# Patient Record
Sex: Female | Born: 1970 | Race: White | Hispanic: No | Marital: Married | Smoking: Never smoker
Health system: Southern US, Community
[De-identification: ages and names within clinical notes are randomized; demographics above are authoritative.]

## PROBLEM LIST (undated history)

## (undated) DIAGNOSIS — J069 Acute upper respiratory infection, unspecified: Secondary | ICD-10-CM

## (undated) DIAGNOSIS — R112 Nausea with vomiting, unspecified: Secondary | ICD-10-CM

## (undated) DIAGNOSIS — J40 Bronchitis, not specified as acute or chronic: Secondary | ICD-10-CM

## (undated) DIAGNOSIS — L509 Urticaria, unspecified: Secondary | ICD-10-CM

## (undated) DIAGNOSIS — Z9889 Other specified postprocedural states: Secondary | ICD-10-CM

## (undated) DIAGNOSIS — R091 Pleurisy: Secondary | ICD-10-CM

## (undated) DIAGNOSIS — J45909 Unspecified asthma, uncomplicated: Secondary | ICD-10-CM

## (undated) HISTORY — PX: CERVICAL BIOPSY  W/ LOOP ELECTRODE EXCISION: SUR135

## (undated) HISTORY — DX: Acute upper respiratory infection, unspecified: J06.9

## (undated) HISTORY — DX: Unspecified asthma, uncomplicated: J45.909

## (undated) HISTORY — DX: Urticaria, unspecified: L50.9

## (undated) HISTORY — PX: APPENDECTOMY: SHX54

---

## 2001-07-14 ENCOUNTER — Other Ambulatory Visit: Admission: RE | Admit: 2001-07-14 | Discharge: 2001-07-14 | Payer: Self-pay | Admitting: Family Medicine

## 2001-07-14 ENCOUNTER — Other Ambulatory Visit: Admission: RE | Admit: 2001-07-14 | Discharge: 2001-07-14 | Payer: Self-pay | Admitting: Obstetrics & Gynecology

## 2002-07-19 ENCOUNTER — Other Ambulatory Visit: Admission: RE | Admit: 2002-07-19 | Discharge: 2002-07-19 | Payer: Self-pay | Admitting: Obstetrics and Gynecology

## 2003-08-29 ENCOUNTER — Other Ambulatory Visit: Admission: RE | Admit: 2003-08-29 | Discharge: 2003-08-29 | Payer: Self-pay | Admitting: Obstetrics & Gynecology

## 2004-08-13 ENCOUNTER — Other Ambulatory Visit: Admission: RE | Admit: 2004-08-13 | Discharge: 2004-08-13 | Payer: Self-pay | Admitting: Obstetrics & Gynecology

## 2006-09-21 ENCOUNTER — Emergency Department (HOSPITAL_COMMUNITY): Admission: EM | Admit: 2006-09-21 | Discharge: 2006-09-21 | Payer: Self-pay | Admitting: Emergency Medicine

## 2011-01-18 ENCOUNTER — Other Ambulatory Visit: Payer: Self-pay | Admitting: Obstetrics & Gynecology

## 2011-01-18 DIAGNOSIS — N6320 Unspecified lump in the left breast, unspecified quadrant: Secondary | ICD-10-CM

## 2011-01-24 ENCOUNTER — Other Ambulatory Visit: Payer: Self-pay | Admitting: Obstetrics & Gynecology

## 2011-01-24 ENCOUNTER — Ambulatory Visit
Admission: RE | Admit: 2011-01-24 | Discharge: 2011-01-24 | Disposition: A | Discharge status: Home / Self Care | Payer: BC Managed Care – PPO | Source: Ambulatory Visit | Attending: Obstetrics & Gynecology | Admitting: Obstetrics & Gynecology

## 2011-01-24 DIAGNOSIS — N6320 Unspecified lump in the left breast, unspecified quadrant: Secondary | ICD-10-CM

## 2014-12-27 DIAGNOSIS — Z91038 Other insect allergy status: Secondary | ICD-10-CM | POA: Insufficient documentation

## 2015-05-22 ENCOUNTER — Encounter (HOSPITAL_BASED_OUTPATIENT_CLINIC_OR_DEPARTMENT_OTHER): Payer: Self-pay | Admitting: *Deleted

## 2015-05-22 ENCOUNTER — Emergency Department (HOSPITAL_BASED_OUTPATIENT_CLINIC_OR_DEPARTMENT_OTHER): Payer: BLUE CROSS/BLUE SHIELD

## 2015-05-22 ENCOUNTER — Emergency Department (HOSPITAL_BASED_OUTPATIENT_CLINIC_OR_DEPARTMENT_OTHER)
Admission: EM | Admit: 2015-05-22 | Discharge: 2015-05-22 | Disposition: A | Discharge status: Home / Self Care | Payer: BLUE CROSS/BLUE SHIELD | Attending: Emergency Medicine | Admitting: Emergency Medicine

## 2015-05-22 DIAGNOSIS — R42 Dizziness and giddiness: Secondary | ICD-10-CM | POA: Diagnosis not present

## 2015-05-22 DIAGNOSIS — R110 Nausea: Secondary | ICD-10-CM | POA: Insufficient documentation

## 2015-05-22 DIAGNOSIS — Z8709 Personal history of other diseases of the respiratory system: Secondary | ICD-10-CM | POA: Diagnosis not present

## 2015-05-22 DIAGNOSIS — R079 Chest pain, unspecified: Secondary | ICD-10-CM | POA: Insufficient documentation

## 2015-05-22 HISTORY — DX: Bronchitis, not specified as acute or chronic: J40

## 2015-05-22 HISTORY — DX: Pleurisy: R09.1

## 2015-05-22 LAB — CBC WITH DIFFERENTIAL/PLATELET
BASOS ABS: 0 10*3/uL (ref 0.0–0.1)
Basophils Relative: 0 %
EOS PCT: 1 %
Eosinophils Absolute: 0.1 10*3/uL (ref 0.0–0.7)
HCT: 40.9 % (ref 36.0–46.0)
Hemoglobin: 13.1 g/dL (ref 12.0–15.0)
LYMPHS ABS: 2 10*3/uL (ref 0.7–4.0)
LYMPHS PCT: 29 %
MCH: 30 pg (ref 26.0–34.0)
MCHC: 32 g/dL (ref 30.0–36.0)
MCV: 93.8 fL (ref 78.0–100.0)
Monocytes Absolute: 0.7 10*3/uL (ref 0.1–1.0)
Monocytes Relative: 9 %
NEUTROS ABS: 4.2 10*3/uL (ref 1.7–7.7)
NEUTROS PCT: 61 %
PLATELETS: 309 10*3/uL (ref 150–400)
RBC: 4.36 MIL/uL (ref 3.87–5.11)
RDW: 12.6 % (ref 11.5–15.5)
WBC: 6.9 10*3/uL (ref 4.0–10.5)

## 2015-05-22 LAB — COMPREHENSIVE METABOLIC PANEL
ALBUMIN: 4.4 g/dL (ref 3.5–5.0)
ALT: 18 U/L (ref 14–54)
AST: 19 U/L (ref 15–41)
Alkaline Phosphatase: 68 U/L (ref 38–126)
Anion gap: 8 (ref 5–15)
BUN: 12 mg/dL (ref 6–20)
CHLORIDE: 104 mmol/L (ref 101–111)
CO2: 27 mmol/L (ref 22–32)
Calcium: 9.4 mg/dL (ref 8.9–10.3)
Creatinine, Ser: 0.69 mg/dL (ref 0.44–1.00)
GFR calc Af Amer: >60 mL/min (ref >60)
GFR calc non Af Amer: >60 mL/min (ref >60)
GLUCOSE: 96 mg/dL (ref 65–99)
POTASSIUM: 3.6 mmol/L (ref 3.5–5.1)
SODIUM: 139 mmol/L (ref 135–145)
Total Bilirubin: 0.6 mg/dL (ref 0.3–1.2)
Total Protein: 7.6 g/dL (ref 6.5–8.1)

## 2015-05-22 LAB — TROPONIN I: Troponin I: <0.03 ng/mL (ref <0.031)

## 2015-05-22 NOTE — ED Provider Notes (Signed)
CSN: [id number]     Arrival date & time 05/22/15  1745 History  By signing my name below, I, Eustaquio Maize, attest that this documentation has been prepared under the direction and in the presence of Quintella Reichert, MD. Electronically Signed: Eustaquio Maize, ED Scribe. 05/22/2015. 7:39 PM.  Chief Complaint  Patient presents with  . Chest Pain   The history is provided by the patient. No language interpreter was used.     HPI Comments: Wanda Cortez is a 44 y.o. female who presents to the Emergency Department complaining of gradual onset, constant, left sided chest pain that began at 5:10 PM (approximately 2.5 hours ago). Pt states she was taking a walk when the pain began. The pain was a sharp pressure that lasted approximately 10-15 minutes until it subsided into an aching sensation. Pt states that she is still currently having mild chest pain. She also complains of pain radiating into her left arm, back pain, lightheadedness, dizziness, and nausea. Pt reports chest pain in the past when she was diagnosed with pleurisy but states that these symptoms feel different. She denies any recent change in activity but states she has been feeling more fatigued than usual lately. Pt has been speaking with her PCP about her fatigue. Denies abdominal pain, shortness of breath, diaphoresis, leg swelling, or any other associated symptoms. Pt is non smoker. Denies EtOH or illicit drug use. No fhx of cardiac issues. PSHx appendectomy.   Past Medical History  Diagnosis Date  . Bronchitis   . Pleurisy    Past Surgical History  Procedure Laterality Date  . Appendectomy     No family history on file. Social History  Substance Use Topics  . Smoking status: Never Smoker   . Smokeless tobacco: Never Used  . Alcohol Use: No   OB History    No data available     Review of Systems  Constitutional: Negative for diaphoresis.  Respiratory: Negative for shortness of breath.   Cardiovascular: Positive for  chest pain. Negative for palpitations and leg swelling.  Gastrointestinal: Positive for nausea. Negative for vomiting and abdominal pain.  Neurological: Positive for dizziness and light-headedness.  All other systems reviewed and are negative.  Allergies  Prednisone  Home Medications   Prior to Admission medications   Not on File   Triage Vitals: BP 139/102 mmHg  Pulse 82  Temp(Src) 98.2 F (36.8 C) (Oral)  Resp 16  Ht 5\' 7"  (1.702 m)  Wt 170 lb (77.111 kg)  BMI 26.62 kg/m2  SpO2 100%  LMP 05/07/2015 (Approximate)   Physical Exam  Constitutional: She is oriented to person, place, and time. She appears well-developed and well-nourished.  HENT:  Head: Normocephalic and atraumatic.  Cardiovascular: Normal rate and regular rhythm.   No murmur heard. Pulmonary/Chest: Effort normal and breath sounds normal. No respiratory distress.  Abdominal: Soft. There is no tenderness. There is no rebound and no guarding.  Musculoskeletal: She exhibits no edema or tenderness.  Neurological: She is alert and oriented to person, place, and time.  Skin: Skin is warm and dry.  Psychiatric: She has a normal mood and affect. Her behavior is normal.  Nursing note and vitals reviewed.   ED Course  Procedures (including critical care time)  DIAGNOSTIC STUDIES: Oxygen Saturation is 100% on RA, normal by my interpretation.    COORDINATION OF CARE: 7:38 PM-Discussed treatment plan which includes repeat troponin with pt at bedside and pt agreed to plan.   Labs Review Labs Reviewed  CBC WITH DIFFERENTIAL/PLATELET  COMPREHENSIVE METABOLIC PANEL  TROPONIN I  TROPONIN I    Imaging Review Dg Chest 2 View  05/22/2015  CLINICAL DATA:  Initial encounter for left-sided chest pain starting this evening with dizziness after walking. Pain radiates to left arm and back. EXAM: CHEST  2 VIEW COMPARISON:  None. FINDINGS: The heart size and mediastinal contours are within normal limits. Both lungs are  clear. The visualized skeletal structures are unremarkable. IMPRESSION: No active cardiopulmonary disease. Electronically Signed   By: Misty Stanley M.D.   On: 05/22/2015 18:21   I have personally reviewed and evaluated these images and lab results as part of my medical decision-making.   EKG Interpretation   Date/Time:  Monday May 22 2015 17:57:00 EDT Ventricular Rate:  84 PR Interval:  120 QRS Duration: 80 QT Interval:  348 QTC Calculation: 411 R Axis:   68 Text Interpretation:  Normal sinus rhythm Normal ECG Confirmed by Hazle Coca [phone]) on 05/22/2015 5:56:22 PM      MDM   Final diagnoses:  Chest pain, unspecified chest pain type   Patient here for evaluation of chest pain, atypical for cardiac pain. Presentation is not consistent with ACS, PE, dissection, cholecystitis. Patient's symptoms are resolving in the emergency department without intervention. Discussed with patient unclear source of pain and recommend outpatient follow-up. Return precautions were discussed.  I personally performed the services described in this documentation, which was scribed in my presence. The recorded information has been reviewed and is accurate.      Quintella Reichert, MD 05/23/15 [phone]

## 2015-05-22 NOTE — ED Notes (Signed)
Reports she was walking and felt "a bubble in my chest"- also felt dizzy, nauseated, and had pain in her left arm. States sx have lessened

## 2015-05-22 NOTE — Discharge Instructions (Signed)
Nonspecific Chest Pain  °Chest pain can be caused by many different conditions. There is always a chance that your pain could be related to something serious, such as a heart attack or a blood clot in your lungs. Chest pain can also be caused by conditions that are not life-threatening. If you have chest pain, it is very important to follow up with your health care provider. °CAUSES  °Chest pain can be caused by: °· Heartburn. °· Pneumonia or bronchitis. °· Anxiety or stress. °· Inflammation around your heart (pericarditis) or lung (pleuritis or pleurisy). °· A blood clot in your lung. °· A collapsed lung (pneumothorax). It can develop suddenly on its own (spontaneous pneumothorax) or from trauma to the chest. °· Shingles infection (varicella-zoster virus). °· Heart attack. °· Damage to the bones, muscles, and cartilage that make up your chest wall. This can include: °¨ Bruised bones due to injury. °¨ Strained muscles or cartilage due to frequent or repeated coughing or overwork. °¨ Fracture to one or more ribs. °¨ Sore cartilage due to inflammation (costochondritis). °RISK FACTORS  °Risk factors for chest pain may include: °· Activities that increase your risk for trauma or injury to your chest. °· Respiratory infections or conditions that cause frequent coughing. °· Medical conditions or overeating that can cause heartburn. °· Heart disease or family history of heart disease. °· Conditions or health behaviors that increase your risk of developing a blood clot. °· Having had chicken pox (varicella zoster). °SIGNS AND SYMPTOMS °Chest pain can feel like: °· Burning or tingling on the surface of your chest or deep in your chest. °· Crushing, pressure, aching, or squeezing pain. °· Dull or sharp pain that is worse when you move, cough, or take a deep breath. °· Pain that is also felt in your back, neck, shoulder, or arm, or pain that spreads to any of these areas. °Your chest pain may come and go, or it may stay  constant. °DIAGNOSIS °Lab tests or other studies may be needed to find the cause of your pain. Your health care provider may have you take a test called an ambulatory ECG (electrocardiogram). An ECG records your heartbeat patterns at the time the test is performed. You may also have other tests, such as: °· Transthoracic echocardiogram (TTE). During echocardiography, sound waves are used to create a picture of all of the heart structures and to look at how blood flows through your heart. °· Transesophageal echocardiogram (TEE). This is a more advanced imaging test that obtains images from inside your body. It allows your health care provider to see your heart in finer detail. °· Cardiac monitoring. This allows your health care provider to monitor your heart rate and rhythm in real time. °· Holter monitor. This is a portable device that records your heartbeat and can help to diagnose abnormal heartbeats. It allows your health care provider to track your heart activity for several days, if needed. °· Stress tests. These can be done through exercise or by taking medicine that makes your heart beat more quickly. °· Blood tests. °· Imaging tests. °TREATMENT  °Your treatment depends on what is causing your chest pain. Treatment may include: °· Medicines. These may include: °¨ Acid blockers for heartburn. °¨ Anti-inflammatory medicine. °¨ Pain medicine for inflammatory conditions. °¨ Antibiotic medicine, if an infection is present. °¨ Medicines to dissolve blood clots. °¨ Medicines to treat coronary artery disease. °· Supportive care for conditions that do not require medicines. This may include: °¨ Resting. °¨ Applying heat   or cold packs to injured areas. °¨ Limiting activities until pain decreases. °HOME CARE INSTRUCTIONS °· If you were prescribed an antibiotic medicine, finish it all even if you start to feel better. °· Avoid any activities that bring on chest pain. °· Do not use any tobacco products, including  cigarettes, chewing tobacco, or electronic cigarettes. If you need help quitting, ask your health care provider. °· Do not drink alcohol. °· Take medicines only as directed by your health care provider. °· Keep all follow-up visits as directed by your health care provider. This is important. This includes any further testing if your chest pain does not go away. °· If heartburn is the cause for your chest pain, you may be told to keep your head raised (elevated) while sleeping. This reduces the chance that acid will go from your stomach into your esophagus. °· Make lifestyle changes as directed by your health care provider. These may include: °¨ Getting regular exercise. Ask your health care provider to suggest some activities that are safe for you. °¨ Eating a heart-healthy diet. A registered dietitian can help you to learn healthy eating options. °¨ Maintaining a healthy weight. °¨ Managing diabetes, if necessary. °¨ Reducing stress. °SEEK MEDICAL CARE IF: °· Your chest pain does not go away after treatment. °· You have a rash with blisters on your chest. °· You have a fever. °SEEK IMMEDIATE MEDICAL CARE IF:  °· Your chest pain is worse. °· You have an increasing cough, or you cough up blood. °· You have severe abdominal pain. °· You have severe weakness. °· You faint. °· You have chills. °· You have sudden, unexplained chest discomfort. °· You have sudden, unexplained discomfort in your arms, back, neck, or jaw. °· You have shortness of breath at any time. °· You suddenly start to sweat, or your skin gets clammy. °· You feel nauseous or you vomit. °· You suddenly feel light-headed or dizzy. °· Your heart begins to beat quickly, or it feels like it is skipping beats. °These symptoms may represent a serious problem that is an emergency. Do not wait to see if the symptoms will go away. Get medical help right away. Call your local emergency services (911 in the [place] Do not drive yourself to the hospital. °  °This  information is not intended to replace advice given to you by your health care provider. Make sure you discuss any questions you have with your health care provider. °  °Document Released: 04/17/2005 Document Revised: 07/29/2014 Document Reviewed: 02/11/2014 °Elsevier Interactive Patient Education ©2016 [employer] ° °

## 2016-03-21 DIAGNOSIS — D259 Leiomyoma of uterus, unspecified: Secondary | ICD-10-CM | POA: Insufficient documentation

## 2016-03-21 DIAGNOSIS — D250 Submucous leiomyoma of uterus: Secondary | ICD-10-CM | POA: Insufficient documentation

## 2016-07-22 HISTORY — PX: KNEE SURGERY: SHX244

## 2017-03-20 ENCOUNTER — Other Ambulatory Visit: Payer: Self-pay | Admitting: Internal Medicine

## 2017-03-20 DIAGNOSIS — S99929S Unspecified injury of unspecified foot, sequela: Principal | ICD-10-CM

## 2017-03-20 DIAGNOSIS — S8990XS Unspecified injury of unspecified lower leg, sequela: Secondary | ICD-10-CM

## 2017-03-20 DIAGNOSIS — S99919S Unspecified injury of unspecified ankle, sequela: Principal | ICD-10-CM

## 2017-03-23 ENCOUNTER — Ambulatory Visit
Admission: RE | Admit: 2017-03-23 | Discharge: 2017-03-23 | Disposition: A | Discharge status: Home / Self Care | Payer: BLUE CROSS/BLUE SHIELD | Source: Ambulatory Visit | Attending: Cardiology | Admitting: Cardiology

## 2017-03-23 DIAGNOSIS — S99919S Unspecified injury of unspecified ankle, sequela: Principal | ICD-10-CM

## 2017-03-23 DIAGNOSIS — S99929S Unspecified injury of unspecified foot, sequela: Principal | ICD-10-CM

## 2017-03-23 DIAGNOSIS — S8990XS Unspecified injury of unspecified lower leg, sequela: Secondary | ICD-10-CM

## 2017-06-10 DIAGNOSIS — S83282A Other tear of lateral meniscus, current injury, left knee, initial encounter: Secondary | ICD-10-CM | POA: Insufficient documentation

## 2017-09-03 ENCOUNTER — Other Ambulatory Visit: Payer: Self-pay | Admitting: Cardiology

## 2017-09-03 DIAGNOSIS — R079 Chest pain, unspecified: Secondary | ICD-10-CM

## 2017-09-05 ENCOUNTER — Other Ambulatory Visit: Payer: BLUE CROSS/BLUE SHIELD

## 2017-09-06 ENCOUNTER — Other Ambulatory Visit: Payer: Self-pay | Admitting: Cardiology

## 2017-09-06 DIAGNOSIS — R079 Chest pain, unspecified: Secondary | ICD-10-CM

## 2017-09-07 ENCOUNTER — Ambulatory Visit
Admission: RE | Admit: 2017-09-07 | Discharge: 2017-09-07 | Disposition: A | Discharge status: Home / Self Care | Payer: BLUE CROSS/BLUE SHIELD | Source: Ambulatory Visit | Attending: Cardiology | Admitting: Cardiology

## 2017-09-07 DIAGNOSIS — R079 Chest pain, unspecified: Secondary | ICD-10-CM

## 2017-09-19 ENCOUNTER — Ambulatory Visit
Admission: RE | Admit: 2017-09-19 | Discharge: 2017-09-19 | Disposition: A | Discharge status: Home / Self Care | Payer: BLUE CROSS/BLUE SHIELD | Source: Ambulatory Visit | Attending: Cardiology | Admitting: Cardiology

## 2018-02-02 ENCOUNTER — Other Ambulatory Visit: Payer: Self-pay | Admitting: Orthopedic Surgery

## 2018-02-02 DIAGNOSIS — M25561 Pain in right knee: Secondary | ICD-10-CM

## 2018-02-02 DIAGNOSIS — M25569 Pain in unspecified knee: Secondary | ICD-10-CM

## 2018-02-02 DIAGNOSIS — G8929 Other chronic pain: Secondary | ICD-10-CM

## 2018-02-07 ENCOUNTER — Ambulatory Visit
Admission: RE | Admit: 2018-02-07 | Discharge: 2018-02-07 | Disposition: A | Discharge status: Home / Self Care | Payer: BLUE CROSS/BLUE SHIELD | Source: Ambulatory Visit | Attending: Orthopedic Surgery | Admitting: Orthopedic Surgery

## 2018-02-07 DIAGNOSIS — G8929 Other chronic pain: Secondary | ICD-10-CM

## 2018-02-07 DIAGNOSIS — M25569 Pain in unspecified knee: Secondary | ICD-10-CM

## 2018-02-07 DIAGNOSIS — M25561 Pain in right knee: Secondary | ICD-10-CM

## 2018-08-24 DIAGNOSIS — R079 Chest pain, unspecified: Secondary | ICD-10-CM | POA: Diagnosis not present

## 2018-08-24 DIAGNOSIS — M940 Chondrocostal junction syndrome [Tietze]: Secondary | ICD-10-CM | POA: Diagnosis not present

## 2018-09-22 DIAGNOSIS — M25561 Pain in right knee: Secondary | ICD-10-CM | POA: Insufficient documentation

## 2018-09-22 DIAGNOSIS — M222X1 Patellofemoral disorders, right knee: Secondary | ICD-10-CM | POA: Diagnosis not present

## 2018-09-22 DIAGNOSIS — M93261 Osteochondritis dissecans, right knee: Secondary | ICD-10-CM | POA: Diagnosis not present

## 2018-11-24 DIAGNOSIS — J069 Acute upper respiratory infection, unspecified: Secondary | ICD-10-CM | POA: Diagnosis not present

## 2018-11-24 DIAGNOSIS — Z20828 Contact with and (suspected) exposure to other viral communicable diseases: Secondary | ICD-10-CM | POA: Diagnosis not present

## 2018-11-24 DIAGNOSIS — Z9189 Other specified personal risk factors, not elsewhere classified: Secondary | ICD-10-CM | POA: Diagnosis not present

## 2018-11-24 DIAGNOSIS — R05 Cough: Secondary | ICD-10-CM | POA: Diagnosis not present

## 2018-11-24 DIAGNOSIS — Z03818 Encounter for observation for suspected exposure to other biological agents ruled out: Secondary | ICD-10-CM | POA: Diagnosis not present

## 2018-11-24 DIAGNOSIS — R0602 Shortness of breath: Secondary | ICD-10-CM | POA: Diagnosis not present

## 2018-12-10 DIAGNOSIS — Z03818 Encounter for observation for suspected exposure to other biological agents ruled out: Secondary | ICD-10-CM | POA: Diagnosis not present

## 2019-02-25 DIAGNOSIS — M2550 Pain in unspecified joint: Secondary | ICD-10-CM | POA: Diagnosis not present

## 2019-02-25 DIAGNOSIS — Z8249 Family history of ischemic heart disease and other diseases of the circulatory system: Secondary | ICD-10-CM | POA: Diagnosis not present

## 2019-02-25 DIAGNOSIS — C539 Malignant neoplasm of cervix uteri, unspecified: Secondary | ICD-10-CM | POA: Diagnosis not present

## 2019-02-25 DIAGNOSIS — I10 Essential (primary) hypertension: Secondary | ICD-10-CM | POA: Diagnosis not present

## 2019-03-20 DIAGNOSIS — M25531 Pain in right wrist: Secondary | ICD-10-CM | POA: Diagnosis not present

## 2019-03-25 DIAGNOSIS — N63 Unspecified lump in unspecified breast: Secondary | ICD-10-CM | POA: Diagnosis not present

## 2019-03-30 ENCOUNTER — Other Ambulatory Visit: Payer: Self-pay | Admitting: Obstetrics & Gynecology

## 2019-03-30 DIAGNOSIS — S638X1A Sprain of other part of right wrist and hand, initial encounter: Secondary | ICD-10-CM | POA: Diagnosis not present

## 2019-03-30 DIAGNOSIS — N6310 Unspecified lump in the right breast, unspecified quadrant: Secondary | ICD-10-CM

## 2019-04-02 ENCOUNTER — Other Ambulatory Visit: Payer: Self-pay

## 2019-04-02 ENCOUNTER — Ambulatory Visit
Admission: RE | Admit: 2019-04-02 | Discharge: 2019-04-02 | Disposition: A | Discharge status: Home / Self Care | Payer: BC Managed Care – PPO | Source: Ambulatory Visit | Attending: Obstetrics & Gynecology | Admitting: Obstetrics & Gynecology

## 2019-04-02 ENCOUNTER — Ambulatory Visit
Admission: RE | Admit: 2019-04-02 | Discharge: 2019-04-02 | Disposition: A | Discharge status: Home / Self Care | Payer: BLUE CROSS/BLUE SHIELD | Source: Ambulatory Visit | Attending: Obstetrics & Gynecology | Admitting: Obstetrics & Gynecology

## 2019-04-02 DIAGNOSIS — R922 Inconclusive mammogram: Secondary | ICD-10-CM | POA: Diagnosis not present

## 2019-04-02 DIAGNOSIS — N6310 Unspecified lump in the right breast, unspecified quadrant: Secondary | ICD-10-CM

## 2019-04-02 DIAGNOSIS — N6001 Solitary cyst of right breast: Secondary | ICD-10-CM | POA: Diagnosis not present

## 2019-06-28 DIAGNOSIS — M25521 Pain in right elbow: Secondary | ICD-10-CM | POA: Diagnosis not present

## 2019-07-01 DIAGNOSIS — M25561 Pain in right knee: Secondary | ICD-10-CM | POA: Diagnosis not present

## 2019-07-01 DIAGNOSIS — Z01419 Encounter for gynecological examination (general) (routine) without abnormal findings: Secondary | ICD-10-CM | POA: Diagnosis not present

## 2019-07-01 DIAGNOSIS — S638X1A Sprain of other part of right wrist and hand, initial encounter: Secondary | ICD-10-CM | POA: Diagnosis not present

## 2019-07-01 DIAGNOSIS — M25521 Pain in right elbow: Secondary | ICD-10-CM | POA: Diagnosis not present

## 2019-07-01 DIAGNOSIS — Z6823 Body mass index (BMI) 23.0-23.9, adult: Secondary | ICD-10-CM | POA: Diagnosis not present

## 2019-07-01 DIAGNOSIS — Z124 Encounter for screening for malignant neoplasm of cervix: Secondary | ICD-10-CM | POA: Diagnosis not present

## 2019-07-01 DIAGNOSIS — G8929 Other chronic pain: Secondary | ICD-10-CM | POA: Diagnosis not present

## 2019-07-01 LAB — HM PAP SMEAR

## 2019-07-07 ENCOUNTER — Other Ambulatory Visit: Payer: Self-pay

## 2019-07-07 ENCOUNTER — Encounter: Payer: Self-pay | Admitting: Neurology

## 2019-07-07 DIAGNOSIS — S638X1A Sprain of other part of right wrist and hand, initial encounter: Secondary | ICD-10-CM

## 2019-07-07 DIAGNOSIS — M25521 Pain in right elbow: Secondary | ICD-10-CM

## 2019-07-12 DIAGNOSIS — C539 Malignant neoplasm of cervix uteri, unspecified: Secondary | ICD-10-CM | POA: Insufficient documentation

## 2019-07-12 DIAGNOSIS — N841 Polyp of cervix uteri: Secondary | ICD-10-CM | POA: Insufficient documentation

## 2019-07-12 DIAGNOSIS — Z6823 Body mass index (BMI) 23.0-23.9, adult: Secondary | ICD-10-CM | POA: Diagnosis not present

## 2019-07-12 DIAGNOSIS — Z8541 Personal history of malignant neoplasm of cervix uteri: Secondary | ICD-10-CM | POA: Insufficient documentation

## 2019-07-14 ENCOUNTER — Other Ambulatory Visit: Payer: Self-pay

## 2019-07-14 ENCOUNTER — Ambulatory Visit (INDEPENDENT_AMBULATORY_CARE_PROVIDER_SITE_OTHER): Payer: BC Managed Care – PPO | Admitting: Neurology

## 2019-07-14 DIAGNOSIS — M25521 Pain in right elbow: Secondary | ICD-10-CM | POA: Diagnosis not present

## 2019-07-14 DIAGNOSIS — S638X1A Sprain of other part of right wrist and hand, initial encounter: Secondary | ICD-10-CM

## 2019-07-14 DIAGNOSIS — M79621 Pain in right upper arm: Secondary | ICD-10-CM

## 2019-07-14 NOTE — Procedures (Signed)
[facility]  [address], [address]  [place], [place] [address] Tel: [phone] Fax:  [phone] Test Date:  07/14/2019  Patient: Wanda Cortez DOB: [birth year] Physician: Narda Amber, DO  Sex: Female Height: 5\' 7"  Ref Phys: Charlotte Crumb, MD  ID#: [id number] Temp: 33.0C Technician:    Patient Complaints: This is a 48 year old female referred for evaluation of right hand numbness and elbow pain.  NCV & EMG Findings: Electrodiagnostic testing of the right upper extremity and additional studies of the left shows: 1. Bilateral median, ulnar, and mixed palmar sensory responses are within normal limits. 2. Bilateral median and ulnar motor responses are within normal limits. 3. Needle electrode examination was limited and prematurely terminated at patient's request due to pain.  Impression: This is a limited study as EMG was not performed, at patient's request due to pain.  Nerve conduction studies of both upper extremities are within normal limits. In particular, there is no evidence of carpal tunnel syndrome or ulnar neuropathy.    ___________________________ Narda Amber, DO    Nerve Conduction Studies Anti Sensory Summary Table   Site NR Peak (ms) Norm Peak (ms) P-T Amp (V) Norm P-T Amp  Left Median Anti Sensory (2nd Digit)  33C  Wrist    3.0 <3.4 48.2 >20  Right Median Anti Sensory (2nd Digit)  33C  Wrist    2.7 <3.4 43.4 >20  Left Ulnar Anti Sensory (5th Digit)  33C  Wrist    3.0 <3.1 47.7 >12  Right Ulnar Anti Sensory (5th Digit)  33C  Wrist    2.6 <3.1 45.3 >12   Motor Summary Table   Site NR Onset (ms) Norm Onset (ms) O-P Amp (mV) Norm O-P Amp Site1 Site2 Delta-0 (ms) Dist (cm) Vel (m/s) Norm Vel (m/s)  Left Median Motor (Abd Poll Brev)  33C  Wrist    2.8 <3.9 11.4 >6 Elbow Wrist 4.9 29.0 59 >50  Elbow    7.7  11.2         Right Median Motor (Abd Poll Brev)  33C  Wrist    2.6 <3.9 13.5 >6 Elbow Wrist 5.0 29.0 58 >50  Elbow    7.6  13.3          Left Ulnar Motor (Abd Dig Minimi)  33C  Wrist    2.6 <3.1 8.4 >7 B Elbow Wrist 3.9 24.0 62 >50  B Elbow    6.5  7.8  A Elbow B Elbow 1.7 10.0 59 >50  A Elbow    8.2  7.2         Right Ulnar Motor (Abd Dig Minimi)  33C  Wrist    2.1 <3.1 10.8 >7 B Elbow Wrist 3.9 24.0 62 >50  B Elbow    6.0  9.9  A Elbow B Elbow 1.7 10.0 59 >50  A Elbow    7.7  9.9          Comparison Summary Table   Site NR Peak (ms) Norm Peak (ms) P-T Amp (V) Site1 Site2 Delta-P (ms) Norm Delta (ms)  Left Median/Ulnar Palm Comparison (Wrist - 8cm)  33C  Median Palm    1.8 <2.2 73.6 Median Palm Ulnar Palm 0.1   Ulnar Palm    1.7 <2.2 30.7      Right Median/Ulnar Palm Comparison (Wrist - 8cm)  33C  Median Palm    1.5 <2.2 80.5 Median Palm Ulnar Palm 0.1   Ulnar Palm    1.6 <2.2 35.7  EMG   Side Muscle Ins Act Fibs Psw Fasc Number Recrt Dur Dur. Amp Amp. Poly Poly. Comment  Right 1stDorInt Nml Nml Nml Nml Nml Nml Nml Nml Nml Nml Nml Nml N/A  Right PronatorTeres Nml Nml Nml Nml Nml Nml Nml Nml Nml Nml Nml Nml N/A      Waveforms:

## 2019-07-22 DIAGNOSIS — M25521 Pain in right elbow: Secondary | ICD-10-CM | POA: Diagnosis not present

## 2019-08-26 DIAGNOSIS — H5213 Myopia, bilateral: Secondary | ICD-10-CM | POA: Diagnosis not present

## 2019-08-26 DIAGNOSIS — H524 Presbyopia: Secondary | ICD-10-CM | POA: Diagnosis not present

## 2019-08-26 DIAGNOSIS — H04123 Dry eye syndrome of bilateral lacrimal glands: Secondary | ICD-10-CM | POA: Diagnosis not present

## 2019-08-26 DIAGNOSIS — H02834 Dermatochalasis of left upper eyelid: Secondary | ICD-10-CM | POA: Diagnosis not present

## 2019-08-26 DIAGNOSIS — H52203 Unspecified astigmatism, bilateral: Secondary | ICD-10-CM | POA: Diagnosis not present

## 2019-08-26 DIAGNOSIS — H02831 Dermatochalasis of right upper eyelid: Secondary | ICD-10-CM | POA: Diagnosis not present

## 2019-10-07 DIAGNOSIS — I6523 Occlusion and stenosis of bilateral carotid arteries: Secondary | ICD-10-CM | POA: Diagnosis not present

## 2019-10-07 DIAGNOSIS — M2420 Disorder of ligament, unspecified site: Secondary | ICD-10-CM | POA: Diagnosis not present

## 2019-10-07 DIAGNOSIS — M542 Cervicalgia: Secondary | ICD-10-CM | POA: Diagnosis not present

## 2019-10-15 DIAGNOSIS — M542 Cervicalgia: Secondary | ICD-10-CM | POA: Diagnosis not present

## 2019-10-15 DIAGNOSIS — M2420 Disorder of ligament, unspecified site: Secondary | ICD-10-CM | POA: Diagnosis not present

## 2019-10-21 DIAGNOSIS — M542 Cervicalgia: Secondary | ICD-10-CM | POA: Diagnosis not present

## 2019-10-21 DIAGNOSIS — I6523 Occlusion and stenosis of bilateral carotid arteries: Secondary | ICD-10-CM | POA: Diagnosis not present

## 2019-11-03 DIAGNOSIS — L550 Sunburn of first degree: Secondary | ICD-10-CM | POA: Diagnosis not present

## 2019-11-03 DIAGNOSIS — L821 Other seborrheic keratosis: Secondary | ICD-10-CM | POA: Diagnosis not present

## 2019-11-08 DIAGNOSIS — M542 Cervicalgia: Secondary | ICD-10-CM | POA: Diagnosis not present

## 2019-11-18 DIAGNOSIS — M542 Cervicalgia: Secondary | ICD-10-CM | POA: Diagnosis not present

## 2019-11-25 DIAGNOSIS — M542 Cervicalgia: Secondary | ICD-10-CM | POA: Diagnosis not present

## 2019-12-01 ENCOUNTER — Other Ambulatory Visit: Payer: Self-pay

## 2019-12-02 ENCOUNTER — Ambulatory Visit (INDEPENDENT_AMBULATORY_CARE_PROVIDER_SITE_OTHER): Payer: BC Managed Care – PPO | Admitting: Family Medicine

## 2019-12-02 ENCOUNTER — Encounter: Payer: Self-pay | Admitting: Family Medicine

## 2019-12-02 VITALS — BP 102/80 | HR 86 | Temp 97.5°F | Ht 67.0 in | Wt 144.0 lb

## 2019-12-02 DIAGNOSIS — Z0000 Encounter for general adult medical examination without abnormal findings: Secondary | ICD-10-CM

## 2019-12-02 DIAGNOSIS — Z2821 Immunization not carried out because of patient refusal: Secondary | ICD-10-CM | POA: Diagnosis not present

## 2019-12-02 DIAGNOSIS — Z87828 Personal history of other (healed) physical injury and trauma: Secondary | ICD-10-CM

## 2019-12-02 DIAGNOSIS — M542 Cervicalgia: Secondary | ICD-10-CM | POA: Diagnosis not present

## 2019-12-02 LAB — CBC
HCT: 41.7 % (ref 36.0–46.0)
Hemoglobin: 14.1 g/dL (ref 12.0–15.0)
MCHC: 33.8 g/dL (ref 30.0–36.0)
MCV: 93.2 fl (ref 78.0–100.0)
Platelets: 278 10*3/uL (ref 150.0–400.0)
RBC: 4.47 Mil/uL (ref 3.87–5.11)
RDW: 12.8 % (ref 11.5–15.5)
WBC: 4 10*3/uL (ref 4.0–10.5)

## 2019-12-02 LAB — LIPID PANEL
Cholesterol: 197 mg/dL (ref 0–200)
HDL: 59.4 mg/dL (ref >39.00)
LDL Cholesterol: 122 mg/dL — ABNORMAL HIGH (ref 0–99)
NonHDL: 137.64
Total CHOL/HDL Ratio: 3
Triglycerides: 80 mg/dL (ref 0.0–149.0)
VLDL: 16 mg/dL (ref 0.0–40.0)

## 2019-12-02 LAB — BASIC METABOLIC PANEL
BUN: 18 mg/dL (ref 6–23)
CO2: 25 mEq/L (ref 19–32)
Calcium: 9.3 mg/dL (ref 8.4–10.5)
Chloride: 103 mEq/L (ref 96–112)
Creatinine, Ser: 0.66 mg/dL (ref 0.40–1.20)
GFR: 95.05 mL/min (ref >60.00)
Glucose, Bld: 87 mg/dL (ref 70–99)
Potassium: 3.8 mEq/L (ref 3.5–5.1)
Sodium: 137 mEq/L (ref 135–145)

## 2019-12-02 LAB — ALT: ALT: 15 U/L (ref 0–35)

## 2019-12-02 LAB — VITAMIN D 25 HYDROXY (VIT D DEFICIENCY, FRACTURES): VITD: 53.39 ng/mL (ref 30.00–100.00)

## 2019-12-02 LAB — AST: AST: 17 U/L (ref 0–37)

## 2019-12-02 MED ORDER — ALPRAZOLAM 0.25 MG PO TABS
0.2500 mg | ORAL_TABLET | Freq: Every evening | ORAL | 0 refills | Status: DC | PRN
Start: 2019-12-02 — End: 2020-10-16
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
## 2019-12-03 ENCOUNTER — Encounter: Payer: Self-pay | Admitting: Family Medicine

## 2019-12-09 DIAGNOSIS — M542 Cervicalgia: Secondary | ICD-10-CM | POA: Diagnosis not present

## 2019-12-10 ENCOUNTER — Telehealth (INDEPENDENT_AMBULATORY_CARE_PROVIDER_SITE_OTHER): Payer: BC Managed Care – PPO | Admitting: Nurse Practitioner

## 2019-12-10 ENCOUNTER — Encounter: Payer: Self-pay | Admitting: Nurse Practitioner

## 2019-12-10 ENCOUNTER — Other Ambulatory Visit: Payer: BC Managed Care – PPO

## 2019-12-10 VITALS — Temp 100.3°F | Ht 67.0 in | Wt 139.0 lb

## 2019-12-10 DIAGNOSIS — Z20828 Contact with and (suspected) exposure to other viral communicable diseases: Secondary | ICD-10-CM | POA: Diagnosis not present

## 2019-12-10 DIAGNOSIS — J069 Acute upper respiratory infection, unspecified: Secondary | ICD-10-CM | POA: Diagnosis not present

## 2019-12-10 DIAGNOSIS — Z20822 Contact with and (suspected) exposure to covid-19: Secondary | ICD-10-CM | POA: Diagnosis not present

## 2019-12-10 MED ORDER — SALINE SPRAY 0.65 % NA SOLN: 1.0000 | NASAL | 0 refills | Status: DC | PRN

## 2019-12-10 MED ORDER — FLUTICASONE PROPIONATE 50 MCG/ACT NA SUSP: 2.0000 | Freq: Every day | NASAL | 0 refills | Status: DC

## 2019-12-10 MED ORDER — ACETAMINOPHEN 500 MG PO TABS: 500.0000 mg | ORAL_TABLET | Freq: Four times a day (QID) | ORAL | 0 refills | Status: DC | PRN

## 2019-12-10 NOTE — Patient Instructions (Signed)
Maintain adequate oral hydration Alternate between tylenol and ibuprofen prn for fever and headache. Ok to use benzonatate or robitussin for cough prn Go for COVID test today at 3:45pm ([address], [place])

## 2019-12-10 NOTE — Progress Notes (Signed)
Virtual Visit via Video Note  I connected with@ on 12/10/19 at  1:30 PM EDT by a video enabled telemedicine application and verified that I am speaking with the correct person using two identifiers.  Location: Patient:Home Provider: Office Participants: patient and provider   I discussed the limitations of evaluation and management by telemedicine and the availability of in person appointments. I also discussed with the patient that there may be a patient responsible charge related to this service. The patient expressed understanding and agreed to proceed.  [id number], nasal congestion, and fever since Tuesday, last COVID vaccine dose 08/2019 (pfizer)  History of Present Illness: Cough This is a new problem. The current episode started in the past 7 days. The problem has been unchanged. The cough is non-productive. Associated symptoms include chills, a fever, myalgias, nasal congestion, postnasal drip and rhinorrhea. Pertinent negatives include no chest pain, headaches, heartburn, hemoptysis, sore throat, shortness of breath, sweats, weight loss or wheezing. Treatments tried: acetaminophen. There is no history of environmental allergies.  works from home, no recent travel out of [place], no known sick contact. States she has benzonatate rx at home.   Observations/Objective: Physical Exam  Constitutional: She is oriented to person, place, and time. No distress.  Pulmonary/Chest: Effort normal.  Neurological: She is alert and oriented to person, place, and time.  Vitals reviewed.  Assessment and Plan: Rayniyah was seen today for virtual, temp, no covid test, cough and fatigue.  Diagnoses and all orders for this visit:  Viral upper respiratory tract infection -     fluticasone (FLONASE) 50 MCG/ACT nasal spray; Place 2 sprays into both nostrils daily. -     sodium chloride (OCEAN) 0.65 % SOLN nasal spray; Place 1 spray into both nostrils as needed for congestion. -     acetaminophen (TYLENOL)  500 MG tablet; Take 1 tablet (500 mg total) by mouth every 6 (six) hours as needed.   Follow Up Instructions: Maintain adequate oral hydration Alternate between tylenol and ibuprofen prn for fever and headache. Ok to use benzonatate or robitussin for cough prn Go for COVID test today at 3:45pm ([address], [place])   I discussed the assessment and treatment plan with the patient. The patient was provided an opportunity to ask questions and all were answered. The patient agreed with the plan and demonstrated an understanding of the instructions.   The patient was advised to call back or seek an in-person evaluation if the symptoms worsen or if the condition fails to improve as anticipated.   Wilfred Lacy, NP

## 2019-12-16 DIAGNOSIS — M542 Cervicalgia: Secondary | ICD-10-CM | POA: Diagnosis not present

## 2019-12-27 ENCOUNTER — Encounter: Payer: Self-pay | Admitting: Family Medicine

## 2019-12-27 NOTE — Telephone Encounter (Signed)
Please see message and advise.  Thank you. ° °

## 2019-12-29 MED ORDER — EPINEPHRINE 0.3 MG/0.3ML IJ SOAJ: INTRAMUSCULAR | 2 refills | Status: DC

## 2019-12-29 NOTE — Telephone Encounter (Signed)
Epi pen refilled to pharm on file

## 2019-12-30 DIAGNOSIS — M542 Cervicalgia: Secondary | ICD-10-CM | POA: Diagnosis not present

## 2020-01-03 ENCOUNTER — Other Ambulatory Visit: Payer: Self-pay

## 2020-01-04 ENCOUNTER — Encounter: Payer: Self-pay | Admitting: Family Medicine

## 2020-01-04 ENCOUNTER — Ambulatory Visit: Payer: BC Managed Care – PPO | Admitting: Family Medicine

## 2020-01-04 VITALS — BP 102/68 | HR 76 | Temp 97.6°F | Ht 67.0 in | Wt 146.2 lb

## 2020-01-04 DIAGNOSIS — D1724 Benign lipomatous neoplasm of skin and subcutaneous tissue of left leg: Secondary | ICD-10-CM | POA: Diagnosis not present

## 2020-01-04 NOTE — Progress Notes (Signed)
Established Patient Office Visit  Subjective:  Patient ID: Wanda Cortez, female    DOB: 01/28/1971  Age: 49 y.o. MRN: [id number]  CC:  Chief Complaint  Patient presents with  . Cyst    C/O painful knot on left inner thigh patient noticed 3 days ago.     HPI Wanda Cortez presents for evaluation of a tender spot on her left inner thigh.  She has noted this over the last for 5 days.  It has not changed in size.  There has been no erythema streaking or discharge.  She has had inclusion cyst before and this is different.  History of zoster affecting her chest wall and the burning is somewhat similar to what she experienced at that time.  She has no history of DVT.  There is no family history of blood clots.  She has been under increased stress with her work.  Past Medical History:  Diagnosis Date  . Bronchitis   . Pleurisy     Past Surgical History:  Procedure Laterality Date  . APPENDECTOMY    . KNEE SURGERY Right 2018    Family History  Problem Relation Age of Onset  . Breast cancer Mother   . Stroke Father   . Hypertension Father   . Arthritis Sister   . Heart disease Paternal Grandmother   . Heart disease Paternal Grandfather     Social History   Socioeconomic History  . Marital status: Single    Spouse name: Not on file  . Number of children: Not on file  . Years of education: Not on file  . Highest education level: Not on file  Occupational History  . Not on file  Tobacco Use  . Smoking status: Never Smoker  . Smokeless tobacco: Never Used  Substance and Sexual Activity  . Alcohol use: No  . Drug use: No  . Sexual activity: Yes    Birth control/protection: None  Other Topics Concern  . Not on file  Social History Narrative  . Not on file   Social Determinants of Health   Financial Resource Strain:   . Difficulty of Paying Living Expenses:   Food Insecurity:   . Worried About Charity fundraiser in the Last Year:   . Arboriculturist in the Last  Year:   Transportation Needs:   . Film/video editor (Medical):   Marland Kitchen Lack of Transportation (Non-Medical):   Physical Activity:   . Days of Exercise per Week:   . Minutes of Exercise per Session:   Stress:   . Feeling of Stress :   Social Connections:   . Frequency of Communication with Friends and Family:   . Frequency of Social Gatherings with Friends and Family:   . Attends Religious Services:   . Active Member of Clubs or Organizations:   . Attends Archivist Meetings:   Marland Kitchen Marital Status:   Intimate Partner Violence:   . Fear of Current or Ex-Partner:   . Emotionally Abused:   Marland Kitchen Physically Abused:   . Sexually Abused:     Outpatient Medications Prior to Visit  Medication Sig Dispense Refill  . ALPRAZolam (XANAX) 0.25 MG tablet Take 1 tablet (0.25 mg total) by mouth at bedtime as needed for anxiety. 20 tablet 0  . Ascorbic Acid (VITAMIN C) 100 MG CHEW Vitamin C    . B Complex Vitamins (VITAMIN B COMPLEX) TABS Take by mouth.    Marland Kitchen BLACK COHOSH-DONG QUAI PO  Take by mouth 2 times daily at 12 noon and 4 pm.    . Cholecalciferol 125 MCG (5000 UT) capsule Take by mouth.    . EPINEPHrine 0.3 mg/0.3 mL IJ SOAJ injection epinephrine 0.3 mg/0.3 mL injection, auto-injector 1 each 2  . MULTIPLE VITAMINS-MINERALS ER PO Take by mouth.    . Nutritional Supplements (CANDIDA COMPLEX PO) Take by mouth.    . vitamin B-12 (CYANOCOBALAMIN) 100 MCG tablet Vitamin B12  taking daily    . acetaminophen (TYLENOL) 500 MG tablet Take 1 tablet (500 mg total) by mouth every 6 (six) hours as needed. (Patient not taking: Reported on 01/04/2020) 30 tablet 0  . sodium chloride (OCEAN) 0.65 % SOLN nasal spray Place 1 spray into both nostrils as needed for congestion. (Patient not taking: Reported on 01/04/2020) 15 mL 0  . fluticasone (FLONASE) 50 MCG/ACT nasal spray Place 2 sprays into both nostrils daily. (Patient not taking: Reported on 01/04/2020) 16 g 0   No facility-administered medications  prior to visit.    Allergies  Allergen Reactions  . Loratadine Palpitations  . Wasp Venom Itching and Hives  . Lorazepam Nausea And Vomiting  . Prednisone Rash    ROS Review of Systems  Constitutional: Negative.   HENT: Negative.   Respiratory: Negative.   Cardiovascular: Negative.   Gastrointestinal: Negative.   Musculoskeletal: Negative for back pain.  Skin: Negative for color change, rash and wound.  Neurological: Negative.       Objective:    Physical Exam Vitals and nursing note reviewed.  Constitutional:      General: She is not in acute distress.    Appearance: Normal appearance. She is normal weight. She is not ill-appearing, toxic-appearing or diaphoretic.  HENT:     Head: Normocephalic and atraumatic.     Right Ear: External ear normal.     Left Ear: External ear normal.  Eyes:     General: No scleral icterus.       Right eye: No discharge.        Left eye: No discharge.     Conjunctiva/sclera: Conjunctivae normal.  Pulmonary:     Effort: Pulmonary effort is normal.  Musculoskeletal:        General: No swelling.       Legs:  Skin:    General: Skin is warm and dry.       Neurological:     Mental Status: She is alert and oriented to person, place, and time.  Psychiatric:        Mood and Affect: Mood normal.        Behavior: Behavior normal.     BP 102/68   Pulse 76   Temp 97.6 F (36.4 C) (Tympanic)   Ht 5\' 7"  (1.702 m)   Wt 146 lb 3.2 oz (66.3 kg)   SpO2 98%   BMI 22.90 kg/m  Wt Readings from Last 3 Encounters:  01/04/20 146 lb 3.2 oz (66.3 kg)  12/10/19 139 lb (63 kg)  12/02/19 144 lb (65.3 kg)     Health Maintenance Due  Topic Date Due  . Hepatitis C Screening  Never done  . COVID-19 Vaccine (1) Never done  . HIV Screening  Never done    There are no preventive care reminders to display for this patient.  No results found for: TSH Lab Results  Component Value Date   WBC 4.0 12/02/2019   HGB 14.1 12/02/2019   HCT 41.7  12/02/2019   MCV 93.2 12/02/2019  PLT 278.0 12/02/2019   Lab Results  Component Value Date   NA 137 12/02/2019   K 3.8 12/02/2019   CO2 25 12/02/2019   GLUCOSE 87 12/02/2019   BUN 18 12/02/2019   CREATININE 0.66 12/02/2019   BILITOT 0.6 05/22/2015   ALKPHOS 68 05/22/2015   AST 17 12/02/2019   ALT 15 12/02/2019   PROT 7.6 05/22/2015   ALBUMIN 4.4 05/22/2015   CALCIUM 9.3 12/02/2019   ANIONGAP 8 05/22/2015   GFR 95.05 12/02/2019   Lab Results  Component Value Date   CHOL 197 12/02/2019   Lab Results  Component Value Date   HDL 59.40 12/02/2019   Lab Results  Component Value Date   LDLCALC 122 (H) 12/02/2019   Lab Results  Component Value Date   TRIG 80.0 12/02/2019   Lab Results  Component Value Date   CHOLHDL 3 12/02/2019   No results found for: HGBA1C    Assessment & Plan:   Problem List Items Addressed This Visit      Other   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   Follow-up: Return if symptoms worsen or fail to improve.  Discussed the possibility of evolving zoster.  Only concern is that there is usually not tenderness with lipomas.  It could be pressing on the nerve.  She will follow back up in the clinic with any changes.  Libby Maw, MD

## 2020-01-04 NOTE — Patient Instructions (Signed)
Lipoma  A lipoma is a noncancerous (benign) tumor that is made up of fat cells. This is a very common type of soft-tissue growth. Lipomas are usually found under the skin (subcutaneous). They may occur in any tissue of the body that contains fat. Common areas for lipomas to appear include the back, arms, shoulders, buttocks, and thighs. Lipomas grow slowly, and they are usually painless. Most lipomas do not cause problems and do not require treatment. What are the causes? The cause of this condition is not known. What increases the risk? You are more likely to develop this condition if:  You are 40-60 years old.  You have a family history of lipomas. What are the signs or symptoms? A lipoma usually appears as a small, round bump under the skin. In most cases, the lump will:  Feel soft or rubbery.  Not cause pain or other symptoms. However, if a lipoma is located in an area where it pushes on nerves, it can become painful or cause other symptoms. How is this diagnosed? A lipoma can usually be diagnosed with a physical exam. You may also have tests to confirm the diagnosis and to rule out other conditions. Tests may include:  Imaging tests, such as a CT scan or an MRI.  Removal of a tissue sample to be looked at under a microscope (biopsy). How is this treated? Treatment for this condition depends on the size of the lipoma and whether it is causing any symptoms.  For small lipomas that are not causing problems, no treatment is needed.  If a lipoma is bigger or it causes problems, surgery may be done to remove the lipoma. Lipomas can also be removed to improve appearance. Most often, the procedure is done after applying a medicine that numbs the area (local anesthetic).  Liposuction may be done to reduce the size of the lipoma before it is removed through surgery, or it may be done to remove the lipoma. Lipomas are removed with this method in order to limit incision size and scarring. A  liposuction tube is inserted through a small incision into the lipoma, and the contents of the lipoma are removed through the tube with suction. Follow these instructions at home:  Watch your lipoma for any changes.  Keep all follow-up visits as told by your health care provider. This is important. Contact a health care provider if:  Your lipoma becomes larger or hard.  Your lipoma becomes painful, red, or increasingly swollen. These could be signs of infection or a more serious condition. Get help right away if:  You develop tingling or numbness in an area near the lipoma. This could indicate that the lipoma is causing nerve damage. Summary  A lipoma is a noncancerous tumor that is made up of fat cells.  Most lipomas do not cause problems and do not require treatment.  If a lipoma is bigger or it causes problems, surgery may be done to remove the lipoma.  Contact a health care provider if your lipoma becomes larger or hard, or if it becomes painful, red, or increasingly swollen. Pain, redness, and swelling could be signs of infection or a more serious condition. This information is not intended to replace advice given to you by your health care provider. Make sure you discuss any questions you have with your health care provider. Document Revised: 02/22/2019 Document Reviewed: 02/22/2019 Elsevier Patient Education  2020 Elsevier Inc.  

## 2020-01-06 DIAGNOSIS — M542 Cervicalgia: Secondary | ICD-10-CM | POA: Diagnosis not present

## 2020-01-12 ENCOUNTER — Telehealth: Payer: Self-pay | Admitting: Family Medicine

## 2020-01-12 NOTE — Telephone Encounter (Signed)
Juliann Pulse from [employer] is calling to speak to the provider regarding patient. She states that patient is not in the age bracket to have this test done. Please call her back at  [phone] (provider support) if you have any questions.  Order reference # [id number]  Thank you, Burley Saver

## 2020-01-12 NOTE — Telephone Encounter (Signed)
I called [facility] back and spoke with Angie.  Angie informed me that the pt's insurance, [employer], medicare advantage, did not cover [place] until pt's are 57.yrs old.  Now they have to do a advance determination, which is done through [employer] and insurance company to determine if they are going to perform it now or if pt will have to wait until 50.  I will fax last office note to them at, [phone] today.  They will make a determination at that time.

## 2020-01-20 DIAGNOSIS — M542 Cervicalgia: Secondary | ICD-10-CM | POA: Diagnosis not present

## 2020-01-28 DIAGNOSIS — M542 Cervicalgia: Secondary | ICD-10-CM | POA: Diagnosis not present

## 2020-02-04 DIAGNOSIS — M542 Cervicalgia: Secondary | ICD-10-CM | POA: Diagnosis not present

## 2020-02-10 DIAGNOSIS — S638X1A Sprain of other part of right wrist and hand, initial encounter: Secondary | ICD-10-CM | POA: Diagnosis not present

## 2020-02-29 DIAGNOSIS — R05 Cough: Secondary | ICD-10-CM | POA: Diagnosis not present

## 2020-03-02 ENCOUNTER — Encounter: Payer: Self-pay | Admitting: Family

## 2020-03-02 ENCOUNTER — Telehealth (INDEPENDENT_AMBULATORY_CARE_PROVIDER_SITE_OTHER): Payer: BC Managed Care – PPO | Admitting: Family

## 2020-03-02 VITALS — Temp 99.1°F | Ht 67.0 in | Wt 144.0 lb

## 2020-03-02 DIAGNOSIS — R059 Cough, unspecified: Secondary | ICD-10-CM

## 2020-03-02 DIAGNOSIS — R05 Cough: Secondary | ICD-10-CM

## 2020-03-02 DIAGNOSIS — J069 Acute upper respiratory infection, unspecified: Secondary | ICD-10-CM | POA: Diagnosis not present

## 2020-03-02 MED ORDER — PROMETHAZINE-DM 6.25-15 MG/5ML PO SYRP
5.0000 mL | ORAL_SOLUTION | Freq: Four times a day (QID) | ORAL | 0 refills | Status: DC | PRN
Start: 2020-03-02 — End: 2020-08-01

## 2020-03-02 NOTE — Progress Notes (Signed)
Virtual Visit via Video   I connected with patient on 03/02/20 at  2:00 PM EDT by a video enabled telemedicine application and verified that I am speaking with the correct person using two identifiers.  Location patient: Home Location provider: [facility], Office Persons participating in the virtual visit: Patient, Provider, CMA   I discussed the limitations of evaluation and management by telemedicine and the availability of in person appointments. The patient expressed understanding and agreed to proceed  Subjective:   HPI:  49 year old female is in by video visit with c/o cough, congestion, ear pain x 5 days. She has not been taking any medication. She reports having lower back pain that is worse with movement. Pain 4/10. Able to reproduce pain.    ROS:   See pertinent positives and negatives per HPI.  Patient Active Problem List   Diagnosis Date Noted  . Lipoma of left lower extremity 01/04/2020  . Cervical cancer ([place]) 07/12/2019  . Polyp of cervix 07/12/2019  . Tear of right scapholunate ligament 03/30/2019  . Essential hypertension 02/25/2019  . Chronic pain of right knee 09/22/2018  . Acute lateral meniscus tear of left knee 06/10/2017  . 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 Uterine leiomyoma 03/21/2016  . 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 Smoking status: Never Smoker  . Smokeless tobacco: Never Used  Substance Use Topics  . Alcohol use: No    Current Outpatient Medications:  .  Ascorbic Acid (VITAMIN C) 100 MG CHEW, Vitamin C, Disp: , Rfl:  .  B Complex Vitamins (VITAMIN B COMPLEX) TABS, Take by mouth., Disp: , Rfl:  .  BLACK COHOSH-DONG QUAI PO, Take by mouth 2 times daily at 12 noon and 4 pm., Disp: , Rfl:  .  Cholecalciferol 125 MCG (5000 UT) capsule, Take by mouth., Disp: , Rfl:  .  EPINEPHrine 0.3 mg/0.3 mL IJ SOAJ injection, epinephrine 0.3 mg/0.3 mL injection, auto-injector, Disp: 1 each,  Rfl: 2 .  MULTIPLE VITAMINS-MINERALS ER PO, Take by mouth., Disp: , Rfl:  .  Nutritional Supplements (CANDIDA COMPLEX PO), Take by mouth., Disp: , Rfl:  .  vitamin B-12 (CYANOCOBALAMIN) 100 MCG tablet, Vitamin B12  taking daily, Disp: , Rfl:  .  acetaminophen (TYLENOL) 500 MG tablet, Take 1 tablet (500 mg total) by mouth every 6 (six) hours as needed. (Patient not taking: Reported on 01/04/2020), Disp: 30 tablet, Rfl: 0 .  ALPRAZolam (XANAX) 0.25 MG tablet, Take 1 tablet (0.25 mg total) by mouth at bedtime as needed for anxiety. (Patient not taking: Reported on 03/02/2020), Disp: 20 tablet, Rfl: 0 .  predniSONE (STERAPRED UNI-PAK 21 TAB) 5 MG (21) TBPK tablet, Take by mouth. (Patient not taking: Reported on 03/02/2020), Disp: , Rfl:  .  promethazine-dextromethorphan (PROMETHAZINE-DM) 6.25-15 MG/5ML syrup, Take 5 mLs by mouth 4 (four) times daily as needed for cough., Disp: 118 mL, Rfl: 0 .  sodium chloride (OCEAN) 0.65 % SOLN nasal spray, Place 1 spray into both nostrils as needed for congestion. (Patient not taking: Reported on 01/04/2020), Disp: 15 mL, Rfl: 0  Allergies  Allergen Reactions  . Loratadine Palpitations  . Wasp Venom Itching and Hives  . Lorazepam Nausea And Vomiting  . Prednisone Rash    Objective:   Temp 99.1 F (37.3 C) (Oral)   Ht 5\' 7"  (1.702 m)   Wt 144 lb (65.3 kg)   BMI 22.55 kg/m   Patient is  well-developed, well-nourished in no acute distress.  Resting comfortably at home.  Head is normocephalic, atraumatic.  No labored breathing.  Speech is clear and coherent with logical content.  Patient is alert and oriented at baseline.    Assessment and Plan:    Deshanda was seen today for cough.  Diagnoses and all orders for this visit:  Cough  Viral upper respiratory illness  Other orders -     promethazine-dextromethorphan (PROMETHAZINE-DM) 6.25-15 MG/5ML syrup; Take 5 mLs by mouth 4 (four) times daily as needed for cough.    Call the office with any  questions or concerns.   Kennyth Arnold, FNP 03/02/2020

## 2020-05-17 ENCOUNTER — Other Ambulatory Visit: Payer: Self-pay

## 2020-05-18 ENCOUNTER — Ambulatory Visit: Payer: BC Managed Care – PPO | Admitting: Nurse Practitioner

## 2020-05-19 ENCOUNTER — Encounter: Payer: Self-pay | Admitting: Nurse Practitioner

## 2020-05-19 ENCOUNTER — Ambulatory Visit: Payer: BC Managed Care – PPO | Admitting: Nurse Practitioner

## 2020-05-19 ENCOUNTER — Other Ambulatory Visit: Payer: Self-pay

## 2020-05-19 VITALS — BP 116/72 | HR 86 | Temp 97.1°F | Ht 66.5 in | Wt 142.0 lb

## 2020-05-19 DIAGNOSIS — R399 Unspecified symptoms and signs involving the genitourinary system: Secondary | ICD-10-CM | POA: Diagnosis not present

## 2020-05-19 LAB — POCT URINALYSIS DIPSTICK
Bilirubin, UA: NEGATIVE
Blood, UA: NEGATIVE
Glucose, UA: NEGATIVE
Ketones, UA: NEGATIVE
Nitrite, UA: NEGATIVE
Protein, UA: NEGATIVE
Spec Grav, UA: 1.01 (ref 1.010–1.025)
Urobilinogen, UA: 0.2 E.U./dL
pH, UA: 7 (ref 5.0–8.0)

## 2020-05-19 MED ORDER — NITROFURANTOIN MONOHYD MACRO 100 MG PO CAPS: 100.0000 mg | ORAL_CAPSULE | Freq: Two times a day (BID) | ORAL | 0 refills | Status: AC

## 2020-05-19 NOTE — Patient Instructions (Signed)
Maintain adequate oral hydration

## 2020-05-19 NOTE — Progress Notes (Signed)
Subjective:  Patient ID: Wanda Cortez, female    DOB: 1970/11/12  Age: 49 y.o. MRN: [id number]  CC: Acute Visit (Pt c/o left side back pain, slight discomfort in abdomen and urinary frequency but not much comes out x5 days. Pt took AZO for two days with no relief)  Urinary Tract Infection  This is a new problem. The current episode started in the past 7 days. The problem occurs every urination. The problem has been unchanged. The quality of the pain is described as burning and aching. The pain is moderate. There has been no fever. She is sexually active. There is no history of pyelonephritis. Associated symptoms include frequency and urgency. Pertinent negatives include no discharge, flank pain, hematuria, hesitancy, nausea, possible pregnancy, sweats or vomiting. She has tried home medications for the symptoms. The treatment provided mild relief. Her past medical history is significant for urinary stasis. There is no history of kidney stones or recurrent UTIs.   Reviewed past Medical, Social and Family history today.  Outpatient Medications Prior to Visit  Medication Sig Dispense Refill  . Ascorbic Acid (VITAMIN C) 100 MG CHEW Vitamin C    . B Complex Vitamins (VITAMIN B COMPLEX) TABS Take by mouth.    Marland Kitchen BLACK COHOSH-DONG QUAI PO Take by mouth 2 times daily at 12 noon and 4 pm.    . Cholecalciferol 125 MCG (5000 UT) capsule Take by mouth.    . EPINEPHrine 0.3 mg/0.3 mL IJ SOAJ injection epinephrine 0.3 mg/0.3 mL injection, auto-injector 1 each 2  . MULTIPLE VITAMINS-MINERALS ER PO Take by mouth.    . vitamin B-12 (CYANOCOBALAMIN) 100 MCG tablet Vitamin B12  taking daily    . acetaminophen (TYLENOL) 500 MG tablet Take 1 tablet (500 mg total) by mouth every 6 (six) hours as needed. (Patient not taking: Reported on 01/04/2020) 30 tablet 0  . ALPRAZolam (XANAX) 0.25 MG tablet Take 1 tablet (0.25 mg total) by mouth at bedtime as needed for anxiety. (Patient not taking: Reported on 03/02/2020) 20  tablet 0  . Nutritional Supplements (CANDIDA COMPLEX PO) Take by mouth. (Patient not taking: Reported on 05/19/2020)    . predniSONE (STERAPRED UNI-PAK 21 TAB) 5 MG (21) TBPK tablet Take by mouth. (Patient not taking: Reported on 03/02/2020)    . promethazine-dextromethorphan (PROMETHAZINE-DM) 6.25-15 MG/5ML syrup Take 5 mLs by mouth 4 (four) times daily as needed for cough. (Patient not taking: Reported on 05/19/2020) 118 mL 0  . sodium chloride (OCEAN) 0.65 % SOLN nasal spray Place 1 spray into both nostrils as needed for congestion. (Patient not taking: Reported on 01/04/2020) 15 mL 0   No facility-administered medications prior to visit.    ROS See HPI  Objective:  BP 116/72 (BP Location: Left Arm, Patient Position: Sitting, Cuff Size: Normal)   Pulse 86   Temp (!) 97.1 F (36.2 C) (Temporal)   Ht 5' 6.5" (1.689 m)   Wt 142 lb (64.4 kg)   SpO2 99%   BMI 22.58 kg/m   Physical Exam Vitals reviewed.  Abdominal:     General: There is no distension.     Palpations: Abdomen is soft.     Tenderness: There is abdominal tenderness in the suprapubic area. There is no left CVA tenderness or guarding.     Hernia: No hernia is present.  Neurological:     Mental Status: She is alert.    Assessment & Plan:  This visit occurred during the SARS-CoV-2 public health emergency.  Safety protocols  were in place, including screening questions prior to the visit, additional usage of staff PPE, and extensive cleaning of exam room while observing appropriate contact time as indicated for disinfecting solutions.   Wanda Cortez was seen today for acute visit.  Diagnoses and all orders for this visit:  UTI symptoms -     POCT Urinalysis Dipstick -     nitrofurantoin, macrocrystal-monohydrate, (MACROBID) 100 MG capsule; Take 1 capsule (100 mg total) by mouth 2 (two) times daily for 7 days.  positive leukocytes, negative nitrite and RBC in POCT urinalysis Problem List Items Addressed This Visit    None      Visit Diagnoses    UTI symptoms    -  Primary   Relevant Medications   nitrofurantoin, macrocrystal-monohydrate, (MACROBID) 100 MG capsule   Other Relevant Orders   POCT Urinalysis Dipstick (Completed)      Follow-up: No follow-ups on file.  Wilfred Lacy, NP

## 2020-05-26 ENCOUNTER — Telehealth: Payer: Self-pay | Admitting: Family Medicine

## 2020-05-26 DIAGNOSIS — N898 Other specified noninflammatory disorders of vagina: Secondary | ICD-10-CM

## 2020-05-26 NOTE — Telephone Encounter (Signed)
Patient is calling and stated that she saw Baldo Ash last week and the antibiotic she was given gave her a yeast infection and wanted to see if something can be sent to the pharmacy, please advise. CB is [phone]

## 2020-05-29 MED ORDER — FLUCONAZOLE 150 MG PO TABS: 150.0000 mg | ORAL_TABLET | Freq: Every day | ORAL | 0 refills | Status: DC

## 2020-05-29 NOTE — Telephone Encounter (Signed)
Diflucan sent

## 2020-05-29 NOTE — Telephone Encounter (Signed)
Patient notified and verbalized understanding. 

## 2020-05-31 ENCOUNTER — Other Ambulatory Visit: Payer: Self-pay | Admitting: Obstetrics and Gynecology

## 2020-05-31 DIAGNOSIS — Z1231 Encounter for screening mammogram for malignant neoplasm of breast: Secondary | ICD-10-CM

## 2020-07-06 ENCOUNTER — Other Ambulatory Visit: Payer: Self-pay

## 2020-07-06 DIAGNOSIS — Z01419 Encounter for gynecological examination (general) (routine) without abnormal findings: Secondary | ICD-10-CM | POA: Diagnosis not present

## 2020-07-06 DIAGNOSIS — N390 Urinary tract infection, site not specified: Secondary | ICD-10-CM | POA: Diagnosis not present

## 2020-07-06 DIAGNOSIS — G4700 Insomnia, unspecified: Secondary | ICD-10-CM | POA: Diagnosis not present

## 2020-07-06 DIAGNOSIS — N951 Menopausal and female climacteric states: Secondary | ICD-10-CM | POA: Insufficient documentation

## 2020-07-06 DIAGNOSIS — R232 Flushing: Secondary | ICD-10-CM | POA: Insufficient documentation

## 2020-07-07 ENCOUNTER — Ambulatory Visit (INDEPENDENT_AMBULATORY_CARE_PROVIDER_SITE_OTHER): Payer: BC Managed Care – PPO

## 2020-07-07 ENCOUNTER — Encounter: Payer: Self-pay | Admitting: Family Medicine

## 2020-07-07 ENCOUNTER — Ambulatory Visit: Payer: BC Managed Care – PPO | Admitting: Family Medicine

## 2020-07-07 VITALS — BP 124/76 | HR 74 | Temp 97.9°F | Ht 66.5 in | Wt 142.8 lb

## 2020-07-07 DIAGNOSIS — W19XXXA Unspecified fall, initial encounter: Secondary | ICD-10-CM

## 2020-07-07 DIAGNOSIS — N951 Menopausal and female climacteric states: Secondary | ICD-10-CM

## 2020-07-07 DIAGNOSIS — M25552 Pain in left hip: Secondary | ICD-10-CM

## 2020-07-07 NOTE — Progress Notes (Signed)
Wanda Cortez is a 49 y.o. female  Chief Complaint  Patient presents with  . Follow-up    Discuss menopause symptoms hot flashes.  Also states that she fell 4 days ago down on a ramp that was wet at [employer] and went forward as well as splits. LT hip   no OTC meds taken for pain.   Declines flu shot.     HPI: Wanda Cortez is a 49 y.o. female seen today to discuss menopausal symptoms, specifically hot flashes/night sweats. Symptoms on/off x 1 year. Improved with black cohosh for a time but now worse. She has also tried evening primrose in the past (no effect) and also added Vit E 3 wks ago.  Of note, pt saw GYN Dr. Allyn Kenner at [facility] yesterday. Dr. Rogue Bussing recommended estrogen patch and pt is not interested in this.   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 She is having Lt hip, Lt sided low back pain, and now Lt groin pain. She endorses decreased ROM secondary to pain. She also endorses numbness and tingling down her leg into foot/toes that is intermittent. Her Lt hip makes a "popping" sound and is very painful. Pain extends up the Lt side of her back. No saddle anesthesia and no bowel or bladder incontinence.    Past Medical History:  Diagnosis Date  . Bronchitis   . Pleurisy     Past Surgical History:  Procedure Laterality Date  . APPENDECTOMY    . KNEE SURGERY Right 2018    Social History   Socioeconomic History  . Marital status: Single    Spouse name: Not on file  . Number of children: Not on file  . Years of education: Not on file  . Highest education level: Not on file  Occupational History  . Not on file  Tobacco Use  . Smoking status: Never Smoker  . Smokeless tobacco: Never Used  Substance and Sexual Activity  . Alcohol use: No  . Drug use: No  . Sexual activity: Yes    Birth control/protection: None  Other Topics Concern  . Not on file  Social History Narrative  . Not on file    Social Determinants of Health   Financial Resource Strain: Not on file  Food Insecurity: Not on file  Transportation Needs: Not on file  Physical Activity: Not on file  Stress: Not on file  Social Connections: Not on file  Intimate Partner Violence: Not on file    Family History  Problem Relation Age of Onset  . Breast cancer Mother   . Stroke Father   . Hypertension Father   . Arthritis Sister   . Heart disease Paternal Grandmother   . Heart disease Paternal Grandfather      Immunization History  Administered Date(s) Administered  . PFIZER SARS-COV-2 Vaccination 08/10/2019, 09/07/2019, 04/21/2020  . PPD Test 04/13/2015    Outpatient Encounter Medications as of 07/07/2020  Medication Sig Note  . Ascorbic Acid (VITAMIN C) 100 MG CHEW Vitamin C   . B Complex Vitamins (VITAMIN B COMPLEX) TABS Take by mouth.   Marland Kitchen BLACK COHOSH-DONG QUAI PO Take by mouth 2 times daily at 12 noon and 4 pm.   . Cholecalciferol 125 MCG (5000 UT) capsule Take by mouth.   . EPINEPHrine 0.3 mg/0.3 mL IJ SOAJ injection epinephrine 0.3 mg/0.3 mL injection, auto-injector   . MULTIPLE VITAMINS-MINERALS ER PO Take by  mouth.   . vitamin B-12 (CYANOCOBALAMIN) 100 MCG tablet Vitamin B12  taking daily   . vitamin E (VITAMIN E) 200 UNIT capsule Take 200 Units by mouth daily.   Marland Kitchen acetaminophen (TYLENOL) 500 MG tablet Take 1 tablet (500 mg total) by mouth every 6 (six) hours as needed. (Patient not taking: No sig reported)   . ALPRAZolam (XANAX) 0.25 MG tablet Take 1 tablet (0.25 mg total) by mouth at bedtime as needed for anxiety. (Patient not taking: No sig reported) 03/02/2020: Have on hand   . fluconazole (DIFLUCAN) 150 MG tablet Take 1 tablet (150 mg total) by mouth daily. Take second tab 3days apart from first tab (Patient not taking: Reported on 07/07/2020)   . Nutritional Supplements (CANDIDA COMPLEX PO) Take by mouth. (Patient not taking: No sig reported)   . predniSONE (STERAPRED UNI-PAK 21 TAB) 5 MG  (21) TBPK tablet Take by mouth. (Patient not taking: No sig reported)   . promethazine-dextromethorphan (PROMETHAZINE-DM) 6.25-15 MG/5ML syrup Take 5 mLs by mouth 4 (four) times daily as needed for cough. (Patient not taking: No sig reported)   . sodium chloride (OCEAN) 0.65 % SOLN nasal spray Place 1 spray into both nostrils as needed for congestion. (Patient not taking: No sig reported)    No facility-administered encounter medications on file as of 07/07/2020.     ROS: Pertinent positives and negatives noted in HPI. Remainder of ROS non-contributory    Allergies  Allergen Reactions  . Loratadine Palpitations  . Wasp Venom Itching and Hives  . Lorazepam Nausea And Vomiting  . Prednisone Rash    BP 124/76   Pulse 74   Temp 97.9 F (36.6 C) (Temporal)   Ht 5' 6.5" (1.689 m)   Wt 142 lb 12.8 oz (64.8 kg)   SpO2 97%   BMI 22.70 kg/m   Physical Exam Constitutional:      General: She is not in acute distress.    Appearance: Normal appearance. She is not ill-appearing.  Cardiovascular:     Pulses: Normal pulses.  Pulmonary:     Effort: No respiratory distress.  Musculoskeletal:     Left hip: Tenderness and bony tenderness present. No deformity. Decreased range of motion. Normal strength.     Right lower leg: No edema.     Left lower leg: No edema.  Neurological:     Mental Status: She is alert and oriented to person, place, and time.     Sensory: No sensory deficit.     Motor: No weakness.  Psychiatric:        Mood and Affect: Mood normal.        Behavior: Behavior normal.      A/P:  1. Acute hip pain, left 2. Accidental fall, initial encounter - pain, "popping", radiation of sharp pain to Lt groin and back, paresthesias into Lt leg - concern for labral tear, hip fx or dislocation, SI joint dysfunction - continue with ice/heat, pt agreeable to trial of ibuprofen 600mg  BID w/ food - DG Hip Unilat W OR W/O Pelvis 2-3 Views Left - Ambulatory referral to Orthopedic  Surgery  3. Hot flashes due to menopause - pt has tried evening primrose oil w/o benefit - she had about 4 mo of symptom improvement with black cohosh but it is no longer effective. She will stop taking and may retry this in 3 mo or so if needed - she recently started Vit E supplement and OTC estroven or similar supplement - ok to continue -  consider paxil 10mg  daily. Pt will consider this and let me know if she would like to start Rx    This visit occurred during the SARS-CoV-2 public health emergency.  Safety protocols were in place, including screening questions prior to the visit, additional usage of staff PPE, and extensive cleaning of exam room while observing appropriate contact time as indicated for disinfecting solutions.

## 2020-07-07 NOTE — Patient Instructions (Addendum)
Consider Paxil 10mg    Ibuprofen 400-600mg  (2-3 tabs) 2x/day with food  [facility] - [address], [place], [place] [address] [phone]

## 2020-07-20 DIAGNOSIS — M1711 Unilateral primary osteoarthritis, right knee: Secondary | ICD-10-CM | POA: Diagnosis not present

## 2020-07-27 ENCOUNTER — Ambulatory Visit: Payer: BC Managed Care – PPO

## 2020-07-27 ENCOUNTER — Telehealth: Payer: BC Managed Care – PPO | Admitting: Orthopedic Surgery

## 2020-07-27 DIAGNOSIS — R059 Cough, unspecified: Secondary | ICD-10-CM

## 2020-07-27 DIAGNOSIS — R0602 Shortness of breath: Secondary | ICD-10-CM | POA: Diagnosis not present

## 2020-07-27 DIAGNOSIS — Z20822 Contact with and (suspected) exposure to covid-19: Secondary | ICD-10-CM | POA: Diagnosis not present

## 2020-07-27 DIAGNOSIS — R52 Pain, unspecified: Secondary | ICD-10-CM | POA: Diagnosis not present

## 2020-07-27 MED ORDER — BENZONATATE 100 MG PO CAPS: 100.0000 mg | ORAL_CAPSULE | Freq: Three times a day (TID) | ORAL | 0 refills | Status: DC | PRN

## 2020-07-27 NOTE — Progress Notes (Signed)
We are sorry that you are not feeling well.  Here is how we plan to help!  Based on your presentation I believe you most likely have A cough due to bacteria.  When patients have a fever and a productive cough with a change in color or increased sputum production, we are concerned about bacterial bronchitis.  If left untreated it can progress to pneumonia.  If your symptoms do not improve with your treatment plan it is important that you contact your provider.     YOUR COURSE IS WORRISOME FOR A BACTERIAL BRONCHITIS. IT IS REASONABLE TO GIVE IT A COUPLE OF MORE DAYS TO SEE IF YOU IMPROVE BUT IF YOU DON'T OR YOU GET ANY WORSE PLEASE CONTACT us AS SOON AS POSSIBLE. THE LIQUID COUGH MEDICINE YOU TAKE AT NIGHT IS PROBABLY TUSSIONEX OR SOMETHING SIMILAR BUT WE CANNOT PRESCRIBE THAT IN THIS VENUE. SORRY!   In addition you may use A non-prescription cough medication called Robitussin DAC. Take 2 teaspoons every 8 hours or Delsym: take 2 teaspoons every 12 hours. and A prescription cough medication called Tessalon Perles 100mg . You may take 1-2 capsules every 8 hours as needed for your cough.   From your responses in the eVisit questionnaire you describe inflammation in the upper respiratory tract which is causing a significant cough.  This is commonly called Bronchitis and has four common causes:    Allergies  Viral Infections  Acid Reflux  Bacterial Infection Allergies, viruses and acid reflux are treated by controlling symptoms or eliminating the cause. An example might be a cough caused by taking certain blood pressure medications. You stop the cough by changing the medication. Another example might be a cough caused by acid reflux. Controlling the reflux helps control the cough.  USE OF BRONCHODILATOR ("RESCUE") INHALERS: There is a risk from using your bronchodilator too frequently.  The risk is that over-reliance on a medication which only relaxes the muscles surrounding the breathing tubes can  reduce the effectiveness of medications prescribed to reduce swelling and congestion of the tubes themselves.  Although you feel brief relief from the bronchodilator inhaler, your asthma may actually be worsening with the tubes becoming more swollen and filled with mucus.  This can delay other crucial treatments, such as oral steroid medications. If you need to use a bronchodilator inhaler daily, several times per day, you should discuss this with your provider.  There are probably better treatments that could be used to keep your asthma under control.     HOME CARE . Only take medications as instructed by your medical team. . Complete the entire course of an antibiotic. . Drink plenty of fluids and get plenty of rest. . Avoid close contacts especially the very young and the elderly . Cover your mouth if you cough or cough into your sleeve. . Always remember to wash your hands . A steam or ultrasonic humidifier can help congestion.   GET HELP RIGHT AWAY IF: . You develop worsening fever. . You become short of breath . You cough up blood. . Your symptoms persist after you have completed your treatment plan MAKE SURE YOU   Understand these instructions.  Will watch your condition.  Will get help right away if you are not doing well or get worse.  Your e-visit answers were reviewed by a board certified advanced clinical practitioner to complete your personal care plan.  Depending on the condition, your plan could have included both over the counter or prescription medications. If there  is a problem please reply  once you have received a response from your provider. Your safety is important to [place].  If you have drug allergies check your prescription carefully.    You can use MyChart to ask questions about today's visit, request a non-urgent call back, or ask for a work or school excuse for 24 hours related to this e-Visit. If it has been greater than 24 hours you will need to follow up with  your provider, or enter a new e-Visit to address those concerns. You will get an e-mail in the next two days asking about your experience.  I hope that your e-visit has been valuable and will speed your recovery. Thank you for using e-visits.  Greater than 5 minutes, yet less than 10 minutes of time have been spent researching, coordinating and implementing care for this patient today.

## 2020-08-01 ENCOUNTER — Encounter: Payer: Self-pay | Admitting: Family

## 2020-08-01 ENCOUNTER — Telehealth (INDEPENDENT_AMBULATORY_CARE_PROVIDER_SITE_OTHER): Payer: BC Managed Care – PPO | Admitting: Family

## 2020-08-01 VITALS — Temp 97.8°F | Ht 66.0 in | Wt 143.0 lb

## 2020-08-01 DIAGNOSIS — R059 Cough, unspecified: Secondary | ICD-10-CM

## 2020-08-01 DIAGNOSIS — J208 Acute bronchitis due to other specified organisms: Secondary | ICD-10-CM | POA: Diagnosis not present

## 2020-08-01 MED ORDER — DOXYCYCLINE HYCLATE 100 MG PO TABS: 100.0000 mg | ORAL_TABLET | Freq: Two times a day (BID) | ORAL | 0 refills | Status: DC

## 2020-08-01 MED ORDER — FLUCONAZOLE 150 MG PO TABS: 150.0000 mg | ORAL_TABLET | Freq: Once | ORAL | 0 refills | Status: AC

## 2020-08-01 MED ORDER — PROMETHAZINE-DM 6.25-15 MG/5ML PO SYRP: 5.0000 mL | ORAL_SOLUTION | Freq: Four times a day (QID) | ORAL | 0 refills | Status: DC | PRN

## 2020-08-01 NOTE — Progress Notes (Signed)
Virtual Visit via Video   I connected with patient on 08/01/20 at  4:00 PM EST by a video enabled telemedicine application and verified that I am speaking with the correct person using two identifiers.  Location patient: Home Location provider: [employer], Office Persons participating in the virtual visit: Patient, Provider, CMA I discussed the limitations of evaluation and management by telemedicine and the availability of in person appointments. The patient expressed understanding and agreed to proceed.  Subjective:   HPI:   50 year old female is in via video visit with concerns of cough, chest congestion, x 10 days that is worsening and keeping her up at night. She had an old rx for cough medication that she has been taking that helps. Cough is productive with yellow phlegm   ROS:   See pertinent positives and negatives per HPI.  Patient Active Problem List   Diagnosis Date Noted  . Lipoma of left lower extremity 01/04/2020  . Cervical cancer ([place]) 07/12/2019  . Polyp of cervix 07/12/2019  . Tear of right scapholunate ligament 03/30/2019  . Essential hypertension 02/25/2019  . Chronic pain of right knee 09/22/2018  . Acute lateral meniscus tear of left knee 06/10/2017  . 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 Uterine leiomyoma 03/21/2016  . 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 Smoking status: Never Smoker  . Smokeless tobacco: Never Used  Substance Use Topics  . Alcohol use: No    Current Outpatient Medications:  .  Ascorbic Acid (VITAMIN C) 100 MG CHEW, Vitamin C, Disp: , Rfl:  .  B Complex Vitamins (VITAMIN B COMPLEX) TABS, Take by mouth., Disp: , Rfl:  .  benzonatate (TESSALON) 100 MG capsule, Take 1 capsule (100 mg total) by mouth 3 (three) times daily as needed for cough., Disp: 20 capsule, Rfl: 0 .  BLACK COHOSH-DONG QUAI PO, Take by mouth 2 times daily at 12 noon and 4 pm., Disp: , Rfl:  .   Cholecalciferol 125 MCG (5000 UT) capsule, Take by mouth., Disp: , Rfl:  .  doxycycline (VIBRA-TABS) 100 MG tablet, Take 1 tablet (100 mg total) by mouth 2 (two) times daily., Disp: 20 tablet, Rfl: 0 .  EPINEPHrine 0.3 mg/0.3 mL IJ SOAJ injection, epinephrine 0.3 mg/0.3 mL injection, auto-injector, Disp: 1 each, Rfl: 2 .  fluconazole (DIFLUCAN) 150 MG tablet, Take 1 tablet (150 mg total) by mouth once for 1 dose., Disp: 1 tablet, Rfl: 0 .  MULTIPLE VITAMINS-MINERALS ER PO, Take by mouth., Disp: , Rfl:  .  vitamin B-12 (CYANOCOBALAMIN) 100 MCG tablet, Vitamin B12  taking daily, Disp: , Rfl:  .  vitamin E 200 UNIT capsule, Take 200 Units by mouth daily., Disp: , Rfl:  .  acetaminophen (TYLENOL) 500 MG tablet, Take 1 tablet (500 mg total) by mouth every 6 (six) hours as needed. (Patient not taking: No sig reported), Disp: 30 tablet, Rfl: 0 .  ALPRAZolam (XANAX) 0.25 MG tablet, Take 1 tablet (0.25 mg total) by mouth at bedtime as needed for anxiety. (Patient not taking: No sig reported), Disp: 20 tablet, Rfl: 0 .  Nutritional Supplements (CANDIDA COMPLEX PO), Take by mouth. (Patient not taking: No sig reported), Disp: , Rfl:  .  predniSONE (STERAPRED UNI-PAK 21 TAB) 5 MG (21) TBPK tablet, Take by mouth. (Patient not taking: No sig reported), Disp: , Rfl:  .  promethazine-dextromethorphan (PROMETHAZINE-DM) 6.25-15 MG/5ML syrup, Take 5 mLs by mouth 4 (four)  times daily as needed for cough., Disp: 118 mL, Rfl: 0 .  sodium chloride (OCEAN) 0.65 % SOLN nasal spray, Place 1 spray into both nostrils as needed for congestion. (Patient not taking: No sig reported), Disp: 15 mL, Rfl: 0  Allergies  Allergen Reactions  . Loratadine Palpitations  . Wasp Venom Itching and Hives  . Lorazepam Nausea And Vomiting  . Prednisone Rash    Objective:   Temp 97.8 F (36.6 C) (Temporal)   Ht 5\' 6"  (1.676 m)   Wt 143 lb (64.9 kg)   BMI 23.08 kg/m   Patient is well-developed, well-nourished in no acute distress.   Resting at home. Deep barky cough  Head is normocephalic, atraumatic.  No labored breathing.  Speech is clear and coherent with logical content.  Patient is alert and oriented at baseline.    Assessment and Plan:    Wanda Cortez was seen today for cough.  Diagnoses and all orders for this visit:  Cough  Acute viral bronchitis  Other orders -     doxycycline (VIBRA-TABS) 100 MG tablet; Take 1 tablet (100 mg total) by mouth 2 (two) times daily. -     promethazine-dextromethorphan (PROMETHAZINE-DM) 6.25-15 MG/5ML syrup; Take 5 mLs by mouth 4 (four) times daily as needed for cough. -     fluconazole (DIFLUCAN) 150 MG tablet; Take 1 tablet (150 mg total) by mouth once for 1 dose.  Will cover for PNA since 10 days and worsening. Take prednisone prescribed from previous OV. Call if symptoms worsen or persist.    Kennyth Arnold, FNP 08/01/2020  Time spent with the patient: 20 minutes, of which >50% was spent in obtaining information about symptoms, reviewing previous labs, evaluations, and treatments, counseling about condition (please see the discussed topics above), and developing a plan to further investigate it; had a number of questions which I addressed.

## 2020-08-24 ENCOUNTER — Telehealth (INDEPENDENT_AMBULATORY_CARE_PROVIDER_SITE_OTHER): Payer: BC Managed Care – PPO | Admitting: Family Medicine

## 2020-08-24 DIAGNOSIS — R059 Cough, unspecified: Secondary | ICD-10-CM

## 2020-08-24 DIAGNOSIS — J989 Respiratory disorder, unspecified: Secondary | ICD-10-CM | POA: Diagnosis not present

## 2020-08-24 MED ORDER — AZITHROMYCIN 250 MG PO TABS: ORAL_TABLET | ORAL | 0 refills | Status: DC

## 2020-08-24 MED ORDER — ALBUTEROL SULFATE HFA 108 (90 BASE) MCG/ACT IN AERS: 2.0000 | INHALATION_SPRAY | Freq: Four times a day (QID) | RESPIRATORY_TRACT | 0 refills | Status: DC | PRN

## 2020-08-24 NOTE — Progress Notes (Signed)
Virtual Visit via Video Note  I connected with Wanda Cortez  on 08/24/20 at  4:00 PM EST by a video enabled telemedicine application and verified that I am speaking with the correct person using two identifiers.  Location patient: home, [place] Location provider:work or home office Persons participating in the virtual visit: patient, provider  I discussed the limitations of evaluation and management by telemedicine and the availability of in person appointments. The patient expressed understanding and agreed to proceed.   HPI:  Acute telemedicine visit for Cough/COVID19: -Onset: 1 week ago -Symptoms include: cough, nasal congestion, a little pain in the back with deep breathing and coughing, diarrhea - now resolved, does sometimes feel like she has a rattle when she deep breaths -Denies: fevers, SOB, vomiting, thick sputum,  inability to ear/drink/get out of bed -has not done a covid test -Pertinent past medical history: had as resp bug starting Jan 1st which required doxycycline - that completely resolved; has a history of pneumonia several times in the past; also reports inhalers have helped with respiratory bugs in the past -Pertinent medication allergies:prednisone, lorazepam, loratadine, wasp venom -COVID-19 vaccine status: fully vaccinated and had booster; had not had flu shot  ROS: See pertinent positives and negatives per HPI.  Past Medical History:  Diagnosis Date  . Bronchitis   . Pleurisy     Past Surgical History:  Procedure Laterality Date  . APPENDECTOMY    . KNEE SURGERY Right 2018     Current Outpatient Medications:  .  acetaminophen (TYLENOL) 500 MG tablet, Take 1 tablet (500 mg total) by mouth every 6 (six) hours as needed. (Patient not taking: No sig reported), Disp: 30 tablet, Rfl: 0 .  ALPRAZolam (XANAX) 0.25 MG tablet, Take 1 tablet (0.25 mg total) by mouth at bedtime as needed for anxiety. (Patient not taking: No sig reported), Disp: 20 tablet, Rfl: 0 .  Ascorbic  Acid (VITAMIN C) 100 MG CHEW, Vitamin C, Disp: , Rfl:  .  B Complex Vitamins (VITAMIN B COMPLEX) TABS, Take by mouth., Disp: , Rfl:  .  BLACK COHOSH-DONG QUAI PO, Take by mouth 2 times daily at 12 noon and 4 pm., Disp: , Rfl:  .  Cholecalciferol 125 MCG (5000 UT) capsule, Take by mouth., Disp: , Rfl:  .  doxycycline (VIBRA-TABS) 100 MG tablet, Take 1 tablet (100 mg total) by mouth 2 (two) times daily., Disp: 20 tablet, Rfl: 0 .  EPINEPHrine 0.3 mg/0.3 mL IJ SOAJ injection, epinephrine 0.3 mg/0.3 mL injection, auto-injector, Disp: 1 each, Rfl: 2 .  MULTIPLE VITAMINS-MINERALS ER PO, Take by mouth., Disp: , Rfl:  .  Nutritional Supplements (CANDIDA COMPLEX PO), Take by mouth. (Patient not taking: No sig reported), Disp: , Rfl:  .  predniSONE (STERAPRED UNI-PAK 21 TAB) 5 MG (21) TBPK tablet, Take by mouth. (Patient not taking: No sig reported), Disp: , Rfl:  .  promethazine-dextromethorphan (PROMETHAZINE-DM) 6.25-15 MG/5ML syrup, Take 5 mLs by mouth 4 (four) times daily as needed for cough., Disp: 118 mL, Rfl: 0 .  sodium chloride (OCEAN) 0.65 % SOLN nasal spray, Place 1 spray into both nostrils as needed for congestion. (Patient not taking: No sig reported), Disp: 15 mL, Rfl: 0 .  vitamin B-12 (CYANOCOBALAMIN) 100 MCG tablet, Vitamin B12  taking daily, Disp: , Rfl:  .  vitamin E 200 UNIT capsule, Take 200 Units by mouth daily., Disp: , Rfl:   EXAM:  VITALS per patient if applicable:  GENERAL: alert, oriented, appears well and in no acute  distress  HEENT: atraumatic, conjunttiva clear, no obvious abnormalities on inspection of external nose and ears  NECK: normal movements of the head and neck  LUNGS: on inspection no signs of respiratory distress, breathing rate appears normal, no obvious gross SOB, gasping or wheezing  CV: no obvious cyanosis  MS: moves all visible extremities without noticeable abnormality  PSYCH/NEURO: pleasant and cooperative, no obvious depression or anxiety, speech  and thought processing grossly intact  ASSESSMENT AND PLAN:  Discussed the following assessment and plan:  Cough  Respiratory illness  -we discussed possible serious and likely etiologies, options for evaluation and workup, limitations of telemedicine visit vs in person visit, treatment, treatment risks and precautions. Pt prefers to treat via telemedicine empirically rather than in person at this moment.  Query upper respiratory illness, bronchitis, Covid versus other.  Possible CAP, though no fever or thick sputum and suspect viral is more likely.  She has a history of pneumonia and inhaler use when sick, so may have a propensity for bronchitis.  She prefers to avoid going for in person care at this time.  She opted for empiric treatment with over-the-counter options for cough, albuterol inhaler every 4-6 hours as needed and delayed antibiotic if any worsening or not improving over the next 24 to 48 hours.  She also agrees to [employer] testing and discussed options for this, her husband is going to try to find her home test kit.  Discussed treatment options, potential complications, isolation and precautions in case she gets a positive Covid test.  Reports she would not want to do treatment if positive as she does not feel that sick. Work/School slipped offered: Declined Scheduled follow up with PCP offered: Agrees to contact PCP office for follow-up as needed Advised to seek prompt in person care if worsening, new symptoms arise, or if is not improving with treatment. Discussed options for inperson care if PCP office not available. Did let this patient know that I only do telemedicine on Tuesdays and Thursdays for [facility]. Advised to schedule follow up visit with PCP or [facility] if any further questions or concerns to avoid delays in care.   I discussed the assessment and treatment plan with the patient. The patient was provided an opportunity to ask questions and all were answered. The patient agreed with  the plan and demonstrated an understanding of the instructions.     Lucretia Kern, DO

## 2020-08-24 NOTE — Patient Instructions (Addendum)
  HOME CARE TIPS:  -[facility] testing information: [email or web address] OR [id number] Most pharmacies also offer testing and home test kits.  -I sent the medication(s) we discussed to your pharmacy: Meds ordered this encounter  Medications  . azithromycin (ZITHROMAX) 250 MG tablet    Sig: 2 tabs day 1, then one tab daily    Dispense:  6 tablet    Refill:  0  . albuterol (PROAIR HFA) 108 (90 Base) MCG/ACT inhaler    Sig: Inhale 2 puffs into the lungs every 6 (six) hours as needed for wheezing or shortness of breath.    Dispense:  1 each    Refill:  0     -can use tylenol or aleve if needed for fevers, aches and pains per instructions  -can use nasal saline a few times per day if you have nasal congestion  -stay hydrated, drink plenty of fluids and eat small healthy meals - avoid dairy  -can take 1000 IU (88mcg) Vit D3 and 100-500 mg of Vit C daily per instructions  -If the Covid test is positive, check out the CDC website for more information on home care, transmission and treatment for COVID19  -follow up with your doctor in 2-3 days unless improving and feeling better  -stay home while sick, except to seek medical care, and if you have [place] ideally it would be best to stay home for a full 10 days since the onset of symptoms PLUS one day of no fever and feeling better. Wear a good [place] (such as N95 or KN95) if around others to reduce the risk of transmission.  It was nice to meet you today, and I really hope you are feeling better soon. I help [facility] out with telemedicine visits on Tuesdays and Thursdays and am available for visits on those days. If you have any concerns or questions following this visit please schedule a follow up visit with your Primary Care doctor or seek care at a local urgent care clinic to avoid delays in care.    Seek in person care or schedule a follow up video visit promptly if your symptoms worsen,  new concerns arise or you are not improving with treatment. Call 911 and/or seek emergency care if your symptoms are severe or life threatening.

## 2020-08-31 ENCOUNTER — Ambulatory Visit: Payer: BC Managed Care – PPO

## 2020-09-07 DIAGNOSIS — H02834 Dermatochalasis of left upper eyelid: Secondary | ICD-10-CM | POA: Diagnosis not present

## 2020-09-07 DIAGNOSIS — H04123 Dry eye syndrome of bilateral lacrimal glands: Secondary | ICD-10-CM | POA: Diagnosis not present

## 2020-09-07 DIAGNOSIS — H524 Presbyopia: Secondary | ICD-10-CM | POA: Diagnosis not present

## 2020-09-07 DIAGNOSIS — H02831 Dermatochalasis of right upper eyelid: Secondary | ICD-10-CM | POA: Diagnosis not present

## 2020-09-07 DIAGNOSIS — H5203 Hypermetropia, bilateral: Secondary | ICD-10-CM | POA: Diagnosis not present

## 2020-09-07 DIAGNOSIS — H52203 Unspecified astigmatism, bilateral: Secondary | ICD-10-CM | POA: Diagnosis not present

## 2020-10-11 ENCOUNTER — Encounter: Payer: Self-pay | Admitting: Family Medicine

## 2020-10-11 ENCOUNTER — Ambulatory Visit: Payer: BC Managed Care – PPO | Admitting: Family Medicine

## 2020-10-11 ENCOUNTER — Other Ambulatory Visit: Payer: Self-pay

## 2020-10-11 ENCOUNTER — Ambulatory Visit (HOSPITAL_BASED_OUTPATIENT_CLINIC_OR_DEPARTMENT_OTHER)
Admission: RE | Admit: 2020-10-11 | Discharge: 2020-10-11 | Disposition: A | Discharge status: Home / Self Care | Payer: BC Managed Care – PPO | Source: Ambulatory Visit | Attending: Family Medicine | Admitting: Family Medicine

## 2020-10-11 VITALS — BP 138/82 | HR 79 | Temp 96.7°F | Ht 66.0 in | Wt 147.6 lb

## 2020-10-11 DIAGNOSIS — M79662 Pain in left lower leg: Secondary | ICD-10-CM

## 2020-10-11 DIAGNOSIS — R600 Localized edema: Secondary | ICD-10-CM | POA: Diagnosis not present

## 2020-10-11 DIAGNOSIS — M79605 Pain in left leg: Secondary | ICD-10-CM | POA: Diagnosis not present

## 2020-10-11 DIAGNOSIS — M7989 Other specified soft tissue disorders: Secondary | ICD-10-CM | POA: Diagnosis not present

## 2020-10-11 NOTE — Progress Notes (Signed)
Wanda Cortez is a 50 y.o. female  Chief Complaint  Patient presents with  . Ankle Pain    Pt c/o swelling in lt ankle, pt explained that the pain started last and is in her lt knee and calf.    HPI: Wanda Cortez is a 50 y.o. female patient who complains of Lt ankle swelling x 2 days and then ankle, foot and medial lower leg pain x 1 day. Pain woke pt from sleep last night.  No injury or trauma.  No fever, chills. She thinks she may feel "a little short of breath" but thought that may be due to anxiety/stress of pain. No CP.  Pt applied ice and elevated leg last night - no real change. Swelling slightly improved today compared to last night but discomfort is worse.  No recent surgery, prolonged immobilization. No know clotting disorder. No h/o DVT/PE. Pt had Rt knee surgery in 2018 but no Lt knee issues or surgery.  Past Medical History:  Diagnosis Date  . Bronchitis   . Pleurisy     Past Surgical History:  Procedure Laterality Date  . APPENDECTOMY    . KNEE SURGERY Right 2018    Social History   Socioeconomic History  . Marital status: Single    Spouse name: Not on file  . Number of children: Not on file  . Years of education: Not on file  . Highest education level: Not on file  Occupational History  . Not on file  Tobacco Use  . Smoking status: Never Smoker  . Smokeless tobacco: Never Used  Substance and Sexual Activity  . Alcohol use: No  . Drug use: No  . Sexual activity: Yes    Birth control/protection: None  Other Topics Concern  . Not on file  Social History Narrative  . Not on file   Social Determinants of Health   Financial Resource Strain: Not on file  Food Insecurity: Not on file  Transportation Needs: Not on file  Physical Activity: Not on file  Stress: Not on file  Social Connections: Not on file  Intimate Partner Violence: Not on file    Family History  Problem Relation Age of Onset  . Breast cancer Mother   . Stroke Father   .  Hypertension Father   . Arthritis Sister   . Heart disease Paternal Grandmother   . Heart disease Paternal Grandfather      Immunization History  Administered Date(s) Administered  . PFIZER(Purple Top)SARS-COV-2 Vaccination 08/10/2019, 09/07/2019, 04/21/2020  . PPD Test 04/13/2015    Outpatient Encounter Medications as of 10/11/2020  Medication Sig Note  . albuterol (PROAIR HFA) 108 (90 Base) MCG/ACT inhaler Inhale 2 puffs into the lungs every 6 (six) hours as needed for wheezing or shortness of breath.   . Ascorbic Acid (VITAMIN C) 100 MG CHEW Vitamin C   . B Complex Vitamins (VITAMIN B COMPLEX) TABS Take by mouth.   Marland Kitchen BLACK COHOSH-DONG QUAI PO Take by mouth 2 times daily at 12 noon and 4 pm.   . Cholecalciferol 125 MCG (5000 UT) capsule Take by mouth.   . EPINEPHrine 0.3 mg/0.3 mL IJ SOAJ injection epinephrine 0.3 mg/0.3 mL injection, auto-injector   . MULTIPLE VITAMINS-MINERALS ER PO Take by mouth.   . vitamin B-12 (CYANOCOBALAMIN) 100 MCG tablet Vitamin B12  taking daily   . vitamin E 200 UNIT capsule Take 200 Units by mouth daily.   Marland Kitchen acetaminophen (TYLENOL) 500 MG tablet Take 1 tablet (500  mg total) by mouth every 6 (six) hours as needed. (Patient not taking: No sig reported)   . ALPRAZolam (XANAX) 0.25 MG tablet Take 1 tablet (0.25 mg total) by mouth at bedtime as needed for anxiety. (Patient not taking: No sig reported) 03/02/2020: Have on hand   . azithromycin (ZITHROMAX) 250 MG tablet 2 tabs day 1, then one tab daily (Patient not taking: Reported on 10/11/2020)   . doxycycline (VIBRA-TABS) 100 MG tablet Take 1 tablet (100 mg total) by mouth 2 (two) times daily. (Patient not taking: Reported on 10/11/2020)   . Nutritional Supplements (CANDIDA COMPLEX PO) Take by mouth. (Patient not taking: No sig reported)   . predniSONE (STERAPRED UNI-PAK 21 TAB) 5 MG (21) TBPK tablet Take by mouth. (Patient not taking: No sig reported)   . promethazine-dextromethorphan (PROMETHAZINE-DM) 6.25-15  MG/5ML syrup Take 5 mLs by mouth 4 (four) times daily as needed for cough. (Patient not taking: Reported on 10/11/2020)   . sodium chloride (OCEAN) 0.65 % SOLN nasal spray Place 1 spray into both nostrils as needed for congestion. (Patient not taking: No sig reported)    No facility-administered encounter medications on file as of 10/11/2020.     ROS: Pertinent positives and negatives noted in HPI. Remainder of ROS non-contributory  Allergies  Allergen Reactions  . Loratadine Palpitations  . Wasp Venom Itching and Hives  . Lorazepam Nausea And Vomiting  . Prednisone Rash    BP 138/82 (BP Location: Left Arm, Patient Position: Sitting, Cuff Size: Normal)   Pulse 79   Temp (!) 96.7 F (35.9 C) (Temporal)   Ht 5\' 6"  (1.676 m)   Wt 147 lb 9.6 oz (67 kg)   SpO2 98%   BMI 23.82 kg/m  Wt Readings from Last 3 Encounters:  10/11/20 147 lb 9.6 oz (67 kg)  08/01/20 143 lb (64.9 kg)  07/07/20 142 lb 12.8 oz (64.8 kg)   Temp Readings from Last 3 Encounters:  10/11/20 (!) 96.7 F (35.9 C) (Temporal)  08/01/20 97.8 F (36.6 C) (Temporal)  07/07/20 97.9 F (36.6 C) (Temporal)   BP Readings from Last 3 Encounters:  10/11/20 138/82  07/07/20 124/76  05/19/20 116/72   Pulse Readings from Last 3 Encounters:  10/11/20 79  07/07/20 74  05/19/20 86    Physical Exam Constitutional:      General: She is not in acute distress.    Appearance: She is not ill-appearing.  Cardiovascular:     Pulses: Normal pulses.  Musculoskeletal:     Left lower leg: Swelling and tenderness present.  Skin:    General: Skin is warm.     Findings: No bruising or erythema.  Neurological:     Mental Status: She is alert and oriented to person, place, and time.  Psychiatric:        Mood and Affect: Mood normal.        Behavior: Behavior normal.      A/P:  1. Edema of left lower extremity 2. Pain of left lower leg - no injury/trauma, no h/o previous similar symptoms - pain worsening and swelling  unchanged - VAS [place] LOWER EXTREMITY VENOUS (DVT); Future - appt at 5pm today at [place] - if + for DVT, will initiate eliquis 10mg  po BID x 7 days then 5mg  PO BID - if -, then will discuss with pt further eval and treatment options   This visit occurred during the SARS-CoV-2 public health emergency.  Safety protocols were in place, including screening questions prior  to the visit, additional usage of staff PPE, and extensive cleaning of exam room while observing appropriate contact time as indicated for disinfecting solutions.

## 2020-10-11 NOTE — Patient Instructions (Signed)
3pm appt at [facility] [address] [place], [place] [address] [phone]

## 2020-10-12 ENCOUNTER — Encounter: Payer: Self-pay | Admitting: Family Medicine

## 2020-10-13 ENCOUNTER — Emergency Department (HOSPITAL_BASED_OUTPATIENT_CLINIC_OR_DEPARTMENT_OTHER): Payer: BC Managed Care – PPO

## 2020-10-13 ENCOUNTER — Other Ambulatory Visit: Payer: Self-pay

## 2020-10-13 ENCOUNTER — Encounter (HOSPITAL_BASED_OUTPATIENT_CLINIC_OR_DEPARTMENT_OTHER): Payer: Self-pay | Admitting: Emergency Medicine

## 2020-10-13 ENCOUNTER — Emergency Department (HOSPITAL_BASED_OUTPATIENT_CLINIC_OR_DEPARTMENT_OTHER)
Admission: EM | Admit: 2020-10-13 | Discharge: 2020-10-13 | Disposition: A | Discharge status: Home / Self Care | Payer: BC Managed Care – PPO | Attending: Emergency Medicine | Admitting: Emergency Medicine

## 2020-10-13 DIAGNOSIS — R079 Chest pain, unspecified: Secondary | ICD-10-CM | POA: Diagnosis not present

## 2020-10-13 DIAGNOSIS — I10 Essential (primary) hypertension: Secondary | ICD-10-CM | POA: Insufficient documentation

## 2020-10-13 DIAGNOSIS — Z8541 Personal history of malignant neoplasm of cervix uteri: Secondary | ICD-10-CM | POA: Diagnosis not present

## 2020-10-13 DIAGNOSIS — R202 Paresthesia of skin: Secondary | ICD-10-CM | POA: Insufficient documentation

## 2020-10-13 DIAGNOSIS — M79609 Pain in unspecified limb: Secondary | ICD-10-CM

## 2020-10-13 DIAGNOSIS — R600 Localized edema: Secondary | ICD-10-CM | POA: Diagnosis not present

## 2020-10-13 LAB — CBC WITH DIFFERENTIAL/PLATELET
Abs Immature Granulocytes: 0 10*3/uL (ref 0.00–0.07)
Basophils Absolute: 0 10*3/uL (ref 0.0–0.1)
Basophils Relative: 1 %
Eosinophils Absolute: 0 10*3/uL (ref 0.0–0.5)
Eosinophils Relative: 1 %
HCT: 40.7 % (ref 36.0–46.0)
Hemoglobin: 13.4 g/dL (ref 12.0–15.0)
Immature Granulocytes: 0 %
Lymphocytes Relative: 33 %
Lymphs Abs: 1 10*3/uL (ref 0.7–4.0)
MCH: 31.2 pg (ref 26.0–34.0)
MCHC: 32.9 g/dL (ref 30.0–36.0)
MCV: 94.7 fL (ref 80.0–100.0)
Monocytes Absolute: 0.3 10*3/uL (ref 0.1–1.0)
Monocytes Relative: 10 %
Neutro Abs: 1.7 10*3/uL (ref 1.7–7.7)
Neutrophils Relative %: 55 %
Platelets: 268 10*3/uL (ref 150–400)
RBC: 4.3 MIL/uL (ref 3.87–5.11)
RDW: 12.9 % (ref 11.5–15.5)
WBC: 3.1 10*3/uL — ABNORMAL LOW (ref 4.0–10.5)
nRBC: 0 % (ref 0.0–0.2)

## 2020-10-13 LAB — TROPONIN I (HIGH SENSITIVITY): Troponin I (High Sensitivity): 2 ng/L (ref <18)

## 2020-10-13 LAB — COMPREHENSIVE METABOLIC PANEL
ALT: 24 U/L (ref 0–44)
AST: 22 U/L (ref 15–41)
Albumin: 4.3 g/dL (ref 3.5–5.0)
Alkaline Phosphatase: 39 U/L (ref 38–126)
Anion gap: 10 (ref 5–15)
BUN: 10 mg/dL (ref 6–20)
CO2: 27 mmol/L (ref 22–32)
Calcium: 9.2 mg/dL (ref 8.9–10.3)
Chloride: 105 mmol/L (ref 98–111)
Creatinine, Ser: 0.6 mg/dL (ref 0.44–1.00)
GFR, Estimated: >60 mL/min (ref >60)
Glucose, Bld: 99 mg/dL (ref 70–99)
Potassium: 3.5 mmol/L (ref 3.5–5.1)
Sodium: 142 mmol/L (ref 135–145)
Total Bilirubin: 0.5 mg/dL (ref 0.3–1.2)
Total Protein: 7 g/dL (ref 6.5–8.1)

## 2020-10-13 LAB — BRAIN NATRIURETIC PEPTIDE: B Natriuretic Peptide: 35.6 pg/mL (ref 0.0–100.0)

## 2020-10-13 LAB — D-DIMER, QUANTITATIVE: D-Dimer, Quant: 0.43 ug/mL-FEU (ref 0.00–0.50)

## 2020-10-13 LAB — PREGNANCY, URINE: Preg Test, Ur: NEGATIVE

## 2020-10-13 NOTE — ED Triage Notes (Signed)
Pt having lower extremity edema for several days.  Swelling worsens throughout the day but improves by morning.  Pt states she is also having left posterior shoulder pain this am.  Pt had some tingling in her left arm yesterday but today is much worse, feels heavy.  No known injuries.  Pt states she feels like she cannot take a deep breath very well.  No acute respiratory distress, pulse ox 100%

## 2020-10-13 NOTE — ED Provider Notes (Signed)
[facility] EMERGENCY DEPARTMENT Provider Note   CSN: [id number] Arrival date & time: 10/13/20  3546     History Chief Complaint  Patient presents with  . Leg Swelling    Wanda Cortez is a 50 y.o. female.  HPI 50 year old female presents with leg swelling and left arm discomfort.  Originally started with left leg swelling about 3 days ago.  Now it has moved into her right leg.  Swelling is from her feet to her knees.  Yesterday noticed a little bit of tingling/discomfort in her left forearm.  This morning when she woke up around 545 she had discomfort that has been coming and going described as a squeezing sensation in her left forearm for a few seconds followed by some tingling.  Comes and goes in different parts of the forearm.  Does not go past her upper arm.  No injuries.  She is also feeling some discomfort in her left thoracic back near her shoulder blade.  No chest pain.  No dyspnea specifically but it feels like she cannot get a full breath.   Past Medical History:  Diagnosis Date  . Bronchitis   . Pleurisy     Patient Active Problem List   Diagnosis Date Noted  . Lipoma of left lower extremity 01/04/2020  . Cervical cancer ([place]) 07/12/2019  . Polyp of cervix 07/12/2019  . Tear of right scapholunate ligament 03/30/2019  . Essential hypertension 02/25/2019  . Chronic pain of right knee 09/22/2018  . Acute lateral meniscus tear of left knee 06/10/2017  . 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 Uterine leiomyoma 03/21/2016  . Allergy to insect stings 12/27/2014    Past Surgical History:  Procedure Laterality Date  . APPENDECTOMY    . KNEE SURGERY Right 2018     OB History   No obstetric history on file.     Family History  Problem Relation Age of Onset  . Breast cancer Mother   . Stroke Father   . Hypertension Father   . Arthritis Sister   . Heart disease Paternal Grandmother   . Heart disease Paternal Grandfather     Social History    Tobacco Use  . Smoking status: Never Smoker  . Smokeless tobacco: Never Used  Substance Use Topics  . Alcohol use: No  . Drug use: No    Home Medications Prior to Admission medications   Medication Sig Start Date End Date Taking? Authorizing Provider  acetaminophen (TYLENOL) 500 MG tablet Take 1 tablet (500 mg total) by mouth every 6 (six) hours as needed. Patient not taking: No sig reported 12/10/19   Nche, Charlene Brooke, NP  albuterol (PROAIR HFA) 108 (90 Base) MCG/ACT inhaler Inhale 2 puffs into the lungs every 6 (six) hours as needed for wheezing or shortness of breath. 08/24/20   Lucretia Kern, DO  ALPRAZolam Duanne Moron) 0.25 MG tablet Take 1 tablet (0.25 mg total) by mouth at bedtime as needed for anxiety. Patient not taking: No sig reported 12/02/19   Ronnald Nian, DO  Ascorbic Acid (VITAMIN C) 100 MG CHEW Vitamin C    [provider]  azithromycin (ZITHROMAX) 250 MG tablet 2 tabs day 1, then one tab daily Patient not taking: Reported on 10/11/2020 08/24/20   Lucretia Kern, DO  B Complex Vitamins (VITAMIN B COMPLEX) TABS Take by mouth.    [provider]  BLACK COHOSH-DONG QUAI PO Take by mouth 2 times daily at 12 noon and 4 pm.  [provider]  Cholecalciferol 125 MCG (5000 UT) capsule Take by mouth.    [provider]  doxycycline (VIBRA-TABS) 100 MG tablet Take 1 tablet (100 mg total) by mouth 2 (two) times daily. Patient not taking: Reported on 10/11/2020 08/01/20   Dutch Quint B, FNP  EPINEPHrine 0.3 mg/0.3 mL IJ SOAJ injection epinephrine 0.3 mg/0.3 mL injection, auto-injector 12/29/19   Cirigliano, Garvin Fila, DO  MULTIPLE VITAMINS-MINERALS ER PO Take by mouth.    [provider]  Nutritional Supplements (CANDIDA COMPLEX PO) Take by mouth. Patient not taking: No sig reported    [provider]  predniSONE (STERAPRED UNI-PAK 21 TAB) 5 MG (21) TBPK tablet Take by mouth. Patient not taking: No sig reported 02/29/20   [provider]  promethazine-dextromethorphan (PROMETHAZINE-DM) 6.25-15 MG/5ML syrup Take 5 mLs by mouth 4 (four) times daily as needed for cough. Patient not taking: Reported on 10/11/2020 08/01/20   Dutch Quint B, FNP  sodium chloride (OCEAN) 0.65 % SOLN nasal spray Place 1 spray into both nostrils as needed for congestion. Patient not taking: No sig reported 12/10/19   Nche, Charlene Brooke, NP  vitamin B-12 (CYANOCOBALAMIN) 100 MCG tablet Vitamin B12  taking daily    [provider]  vitamin E 200 UNIT capsule Take 200 Units by mouth daily.    [provider]    Allergies    Loratadine, Wasp venom, Lorazepam, and Prednisone  Review of Systems   Review of Systems  Constitutional: Negative for fever.  Respiratory: Positive for shortness of breath. Negative for cough.   Cardiovascular: Positive for leg swelling. Negative for chest pain.  Musculoskeletal: Positive for back pain and myalgias.  Neurological: Negative for weakness and numbness.  All other systems reviewed and are negative.   Physical Exam Updated Vital Signs BP 140/82   Pulse 80   Temp 98.8 F (37.1 C) (Oral)   Resp 13   Ht 5\' 6"  (1.676 m)   Wt 67 kg   LMP 09/01/2020   SpO2 100%   BMI 23.82 kg/m   Physical Exam Vitals and nursing note reviewed.  Constitutional:      Appearance: She is well-developed.  HENT:     Head: Normocephalic and atraumatic.     Right Ear: External ear normal.     Left Ear: External ear normal.     Nose: Nose normal.  Eyes:     General:        Right eye: No discharge.        Left eye: No discharge.  Cardiovascular:     Rate and Rhythm: Normal rate and regular rhythm.     Pulses:          Radial pulses are 2+ on the left side.       Dorsalis pedis pulses are 2+ on the right side and 2+ on the left side.     Heart sounds: Normal heart sounds.  Pulmonary:     Effort: Pulmonary effort is normal.     Breath sounds: Normal breath sounds.  Abdominal:      Palpations: Abdomen is soft.     Tenderness: There is no abdominal tenderness.  Musculoskeletal:     Left elbow: No tenderness.     Left forearm: No swelling or tenderness.     Cervical back: No tenderness.     Thoracic back: No tenderness.     Lumbar back: No tenderness.       Back:  Comments: Mild non-pitting swelling to bilateral ankles/lower legs  Skin:    General: Skin is warm and dry.  Neurological:     Mental Status: She is alert.  Psychiatric:        Mood and Affect: Mood is not anxious.     ED Results / Procedures / Treatments   Labs (all labs ordered are listed, but only abnormal results are displayed) Labs Reviewed  CBC WITH DIFFERENTIAL/PLATELET - Abnormal; Notable for the following components:      Result Value   WBC 3.1 (*)    All other components within normal limits  COMPREHENSIVE METABOLIC PANEL  BRAIN NATRIURETIC PEPTIDE  D-DIMER, QUANTITATIVE  PREGNANCY, URINE  TROPONIN I (HIGH SENSITIVITY)  TROPONIN I (HIGH SENSITIVITY)    EKG EKG Interpretation  Date/Time:  Friday October 13 2020 08:52:36 EDT Ventricular Rate:  79 PR Interval:    QRS Duration: 88 QT Interval:  368 QTC Calculation: 422 R Axis:   78 Text Interpretation: Sinus rhythm Borderline short PR interval no acute ST/T changes similar to 2016 Confirmed by Sherwood Gambler [phone]) on 10/13/2020 9:01:04 AM   Radiology DG Chest 2 View  Result Date: 10/13/2020 CLINICAL DATA:  Pain with lower extremity edema EXAM: CHEST - 2 VIEW COMPARISON:  May 22, 2015 FINDINGS: Lungs are clear. Heart size and pulmonary vascularity are normal. No adenopathy. No pneumothorax. No bone lesions. IMPRESSION: Lungs clear.  Heart size normal. Electronically Signed   By: Lowella Grip III M.D.   On: 10/13/2020 09:24   US Venous Img Lower Unilateral Left (DVT)  Result Date: 10/11/2020 CLINICAL DATA:  Left leg swelling, pain EXAM: LEFT LOWER EXTREMITY VENOUS DOPPLER ULTRASOUND TECHNIQUE: Gray-scale sonography  with compression, as well as color and duplex ultrasound, were performed to evaluate the deep venous system(s) from the level of the common femoral vein through the popliteal and proximal calf veins. COMPARISON:  None. FINDINGS: VENOUS Normal compressibility of the common femoral, superficial femoral, and popliteal veins, as well as the visualized calf veins. Visualized portions of profunda femoral vein and great saphenous vein unremarkable. No filling defects to suggest DVT on grayscale or color Doppler imaging. Doppler waveforms show normal direction of venous flow, normal respiratory plasticity and response to augmentation. Limited views of the contralateral common femoral vein are unremarkable. OTHER None. Limitations: none IMPRESSION: Negative. Electronically Signed   By: Rolm Baptise M.D.   On: 10/11/2020 17:37    Procedures Procedures   Medications Ordered in ED Medications - No data to display  ED Course  I have reviewed the triage vital signs and the nursing notes.  Pertinent labs & imaging results that were available during my care of the patient were reviewed by me and considered in my medical decision making (see chart for details).    MDM Rules/Calculators/A&P                          Unclear cause of the patient's leg swelling.  I have recommended elevation and compression stockings.  As for this left arm discomfort, there is no weakness or actual numbness and my suspicion that this is an acute spinal or CNS pathology such as stroke is pretty low.  With her nonspecific thoracic back pain I considered possible dissection, ACS, PE but I think these are all pretty unlikely as well.  Her troponin is negative with a benign ECG after 3+ hours of symptoms.  We discussed we could do a second troponin but  at this point I have pretty low suspicion this is ACS causing her left arm discomfort.  There is no acute bony injury or swelling to her arm.  She had DVT ruled out a couple days ago for her  leg.  At this point I think she is stable for follow-up with her PCP with return precautions, no emergent findings found. Final Clinical Impression(s) / ED Diagnoses Final diagnoses:  Bilateral lower extremity edema  Paresthesia and pain of left extremity    Rx / DC Orders ED Discharge Orders    None       Sherwood Gambler, MD 10/13/20 1038

## 2020-10-13 NOTE — Discharge Instructions (Addendum)
If you develop recurrent, continued, or worsening chest pain, shortness of breath, fever, vomiting, abdominal or back pain, or any other new/concerning symptoms then return to the ER for evaluation.  

## 2020-10-16 ENCOUNTER — Telehealth: Payer: Self-pay | Admitting: Nurse Practitioner

## 2020-10-16 DIAGNOSIS — R600 Localized edema: Secondary | ICD-10-CM

## 2020-10-16 MED ORDER — POTASSIUM CHLORIDE CRYS ER 20 MEQ PO TBCR: 20.0000 meq | EXTENDED_RELEASE_TABLET | Freq: Every day | ORAL | 3 refills | Status: DC

## 2020-10-16 MED ORDER — FUROSEMIDE 20 MG PO TABS: 20.0000 mg | ORAL_TABLET | Freq: Every day | ORAL | 0 refills | Status: DC

## 2020-10-16 NOTE — Telephone Encounter (Signed)
Left a VM for pt to call office to schedule a follow up visit w/Dr. C and also sent her a Mychart message about Nche's recommendations.

## 2020-10-16 NOTE — Telephone Encounter (Signed)
Patient is returning the nurses phone call. Please give her a call back.

## 2020-11-10 DIAGNOSIS — I6523 Occlusion and stenosis of bilateral carotid arteries: Secondary | ICD-10-CM | POA: Diagnosis not present

## 2020-11-16 DIAGNOSIS — D229 Melanocytic nevi, unspecified: Secondary | ICD-10-CM | POA: Diagnosis not present

## 2020-11-16 DIAGNOSIS — L905 Scar conditions and fibrosis of skin: Secondary | ICD-10-CM | POA: Diagnosis not present

## 2020-11-16 DIAGNOSIS — L821 Other seborrheic keratosis: Secondary | ICD-10-CM | POA: Diagnosis not present

## 2020-11-16 DIAGNOSIS — L578 Other skin changes due to chronic exposure to nonionizing radiation: Secondary | ICD-10-CM | POA: Diagnosis not present

## 2020-11-23 ENCOUNTER — Ambulatory Visit: Payer: BC Managed Care – PPO

## 2020-12-20 DIAGNOSIS — R059 Cough, unspecified: Secondary | ICD-10-CM | POA: Diagnosis not present

## 2021-01-10 ENCOUNTER — Other Ambulatory Visit: Payer: Self-pay

## 2021-01-11 ENCOUNTER — Ambulatory Visit: Payer: BC Managed Care – PPO | Admitting: Family Medicine

## 2021-01-11 ENCOUNTER — Encounter: Payer: Self-pay | Admitting: Family Medicine

## 2021-01-11 VITALS — BP 118/74 | HR 86 | Temp 97.3°F | Ht 66.0 in | Wt 149.2 lb

## 2021-01-11 DIAGNOSIS — Z130 Encounter for screening for diseases of the blood and blood-forming organs and certain disorders involving the immune mechanism: Secondary | ICD-10-CM | POA: Diagnosis not present

## 2021-01-11 DIAGNOSIS — L659 Nonscarring hair loss, unspecified: Secondary | ICD-10-CM

## 2021-01-11 LAB — CBC
HCT: 41 % (ref 36.0–46.0)
Hemoglobin: 13.7 g/dL (ref 12.0–15.0)
MCHC: 33.5 g/dL (ref 30.0–36.0)
MCV: 92 fl (ref 78.0–100.0)
Platelets: 253 10*3/uL (ref 150.0–400.0)
RBC: 4.45 Mil/uL (ref 3.87–5.11)
RDW: 13.6 % (ref 11.5–15.5)
WBC: 3.4 10*3/uL — ABNORMAL LOW (ref 4.0–10.5)

## 2021-01-11 LAB — TSH: TSH: 1.45 u[IU]/mL (ref 0.35–4.50)

## 2021-01-11 LAB — FSH/LH
FSH: 95.7 m[IU]/mL
LH: 54.9 m[IU]/mL

## 2021-01-11 LAB — ESTRADIOL: Estradiol: 38 pg/mL

## 2021-01-11 NOTE — Patient Instructions (Signed)
Telogen Effluvium  Telogen effluvium is a condition in which the body sheds much hair. People describe that they are losing hair "from the roots" in excessive amounts. The hair loss is usually 3 to 6 months following an event. The inciting event may be childbirth, a surgery, an illness with a fever, a traumatic psychological event, weight loss, or the start of a new medication. Some people have no known inciting event. For most people, no treatment is necessary, and the hair will stop falling out and begin to regrow with time. Some women develop a chronic form of telogen effluvium in which they continue to lose hair at an accelerated rate.

## 2021-01-11 NOTE — Progress Notes (Signed)
[facility] [address] [place] [address] Dept: [phone] Dept Fax: [phone]  Office Visit  Subjective:    Patient ID: Wanda Cortez, female    DOB: 1971/06/07, 50 y.o..   MRN: [id number]  Chief Complaint  Patient presents with   Acute Visit    C/o having hair loss x 2 weeks.  She states she has appt with her dermatologist next week.  Wants to get blood work done.     History of Present Illness:  Patient is in today for evaluation of recent hair loss. Wanda Cortez notes that over the past two weeks, she has noted significant hair loss. She has not noted any bald patches. She has not history of autoimmune issues. She has no family history of hair loss. She had a viral illness/symptoms around the time this started, which has resolved. Her last regular menses was about 9 months ago, but she has had occasional spotting since then. She is a vegetarian and notes she has had past anemia issues.  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/21/2016   Uterine leiomyoma 03/21/2016   Allergy to insect stings 12/27/2014   Past Surgical History:  Procedure Laterality Date   APPENDECTOMY     KNEE SURGERY Right 2018   Family History  Problem Relation Age of Onset   Breast cancer Mother    Stroke Father    Hypertension Father    Arthritis Sister    Heart disease Paternal Grandmother    Heart disease Paternal Grandfather    Outpatient Medications Prior to Visit  Medication Sig Dispense Refill   albuterol (PROAIR HFA) 108 (90 Base) MCG/ACT inhaler Inhale 2 puffs into the lungs  every 6 (six) hours as needed for wheezing or shortness of breath. 1 each 0   Ascorbic Acid (VITAMIN C) 100 MG CHEW Vitamin C     B Complex Vitamins (VITAMIN B COMPLEX) TABS Take by mouth.     BLACK COHOSH-DONG QUAI PO Take by mouth 2 times daily at 12 noon and 4 pm.     Cholecalciferol 125 MCG (5000 UT) capsule Take by mouth.     EPINEPHrine 0.3 mg/0.3 mL IJ SOAJ injection epinephrine 0.3 mg/0.3 mL injection, auto-injector 1 each 2   MULTIPLE VITAMINS-MINERALS ER PO Take by mouth.     vitamin B-12 (CYANOCOBALAMIN) 100 MCG tablet Vitamin B12  taking daily     vitamin E 200 UNIT capsule Take 200 Units by mouth daily.     furosemide (LASIX) 20 MG tablet Take 1 tablet (20 mg total) by mouth daily. (Patient not taking: Reported on 01/11/2021) 3 tablet 0   Nutritional Supplements (CANDIDA COMPLEX PO) Take by mouth. (Patient not taking: No sig reported)     potassium chloride SA (KLOR-CON) 20 MEQ tablet Take 1 tablet (20 mEq total) by mouth daily. (Patient not taking: Reported on 01/11/2021) 30 tablet 3   predniSONE (STERAPRED UNI-PAK 21 TAB) 5 MG (21) TBPK tablet Take by mouth. (Patient not taking: No sig reported)     No facility-administered medications prior to visit.   Allergies  Allergen Reactions  Loratadine Palpitations   Wasp Venom Itching and Hives   Lorazepam Nausea And Vomiting   Prednisone Rash     Objective:   Today's Vitals   01/11/21 1035  BP: 118/74  Pulse: 86  Temp: (!) 97.3 F (36.3 C)  TempSrc: Temporal  SpO2: 94%  Weight: 149 lb 3.2 oz (67.7 kg)  Height: 5\' 6"  (1.676 m)   Body mass index is 24.08 kg/m.   General: Well developed, well nourished. No acute distress. Scalp: Wanda Cortez has a full head of very long hair. There are no areas of alopecia or visible thinning of the hair. There is slight   dandruff, but no visible rashes or scarring to the scalp. Psych: Alert and oriented. Normal mood and affect.  Health Maintenance Due  Topic Date Due   Pneumococcal  Vaccine 91-46 Years old (1 - PCV) Never done   HIV Screening  Never done   Hepatitis C Screening  Never done   TETANUS/TDAP  Never done   Zoster Vaccines- Shingrix (1 of 2) Never done   COLONOSCOPY (Pts 45-104yrs Insurance coverage will need to be confirmed)  Never done   COVID-19 Vaccine (4 - Booster for Pfizer series) 07/22/2020     Assessment & Plan:   1. Hair loss Wanda Cortez has noted air loss over the past two weeks. I will do some screening labs to assess for hormonal chages that may be associated. 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 However, if her hair loss continues, we may consider other work-up.  - TSH - FSH/LH - Estradiol  2. Screening for deficiency anemia Wanda Cortez has a history of anemia and does follow a vegetarian diet. I will do a CBC to screen for this.  - CBC  Haydee Salter, MD

## 2021-01-15 ENCOUNTER — Encounter: Payer: Self-pay | Admitting: Family Medicine

## 2021-01-15 NOTE — Telephone Encounter (Signed)
Please review message from patient and advise.  Thanks. Dm/cma

## 2021-01-16 DIAGNOSIS — L650 Telogen effluvium: Secondary | ICD-10-CM | POA: Diagnosis not present

## 2021-01-18 ENCOUNTER — Encounter: Payer: Self-pay | Admitting: Family Medicine

## 2021-01-18 ENCOUNTER — Other Ambulatory Visit: Payer: Self-pay

## 2021-01-18 ENCOUNTER — Ambulatory Visit: Payer: BC Managed Care – PPO | Admitting: Family Medicine

## 2021-01-18 ENCOUNTER — Ambulatory Visit: Payer: BC Managed Care – PPO

## 2021-01-18 VITALS — BP 122/76 | HR 102 | Temp 97.9°F | Ht 66.0 in | Wt 153.2 lb

## 2021-01-18 DIAGNOSIS — R300 Dysuria: Secondary | ICD-10-CM | POA: Diagnosis not present

## 2021-01-18 LAB — POCT URINALYSIS DIPSTICK
Bilirubin, UA: NEGATIVE
Blood, UA: NEGATIVE
Glucose, UA: NEGATIVE
Ketones, UA: NEGATIVE
Leukocytes, UA: NEGATIVE
Nitrite, UA: NEGATIVE
Protein, UA: NEGATIVE
Spec Grav, UA: 1.025 (ref 1.010–1.025)
Urobilinogen, UA: 0.2 E.U./dL
pH, UA: 6 (ref 5.0–8.0)

## 2021-01-18 NOTE — Patient Instructions (Signed)
Recommend increase fluid intake over the next week.

## 2021-01-18 NOTE — Progress Notes (Signed)
[facility] [address] [place] [address] Dept: [phone] Dept Fax: [phone]  Office Visit  Subjective:    Patient ID: Wanda Cortez, female    DOB: [birth year], 50 y.o..   MRN: [id number]  Chief Complaint  Patient presents with   Acute Visit    C/o having pain in lower abdomen and odor x 2 days.      History of Present Illness:  Patient is in today with a complaint of mild lower abdominal discomfort and a crampy pain at the end of urination over the past 2 days. She admits to mild nausea without vomiting. She denies any diarrhea. She has had some left lower back discomfort. She does note her urine has appeared cloudy and has had a mild odor.  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 1 each 0   Ascorbic Acid (VITAMIN C) 100 MG CHEW Vitamin C     B Complex Vitamins (VITAMIN B COMPLEX) TABS Take by mouth.      BLACK COHOSH-DONG QUAI PO Take by mouth 2 times daily at 12 noon and 4 pm.     Cholecalciferol 125 MCG (5000 UT) capsule Take by mouth.     EPINEPHrine 0.3 mg/0.3 mL IJ SOAJ injection epinephrine 0.3 mg/0.3 mL injection, auto-injector 1 each 2   ferrous gluconate (FERGON) 324 MG tablet Take 324 mg by mouth daily with breakfast.     MULTIPLE VITAMINS-MINERALS ER PO Take by mouth.     vitamin B-12 (CYANOCOBALAMIN) 100 MCG tablet Vitamin B12  taking daily     vitamin E 200 UNIT capsule Take 200 Units by mouth daily.     furosemide (LASIX) 20 MG tablet Take 1 tablet (20 mg total) by mouth daily. (Patient not taking: No sig reported) 3 tablet 0   Nutritional Supplements (CANDIDA COMPLEX PO) Take by mouth. (Patient not taking: No sig reported)     potassium chloride SA (KLOR-CON) 20 MEQ tablet Take 1 tablet (20 mEq total) by mouth daily. (Patient not taking: No sig reported) 30 tablet 3   predniSONE (STERAPRED UNI-PAK 21 TAB) 5 MG (21) TBPK tablet Take by mouth. (Patient not taking: No sig reported)     No facility-administered medications prior to visit.   Allergies  Allergen Reactions   Loratadine Palpitations   Wasp Venom Itching and Hives   Lorazepam Nausea And Vomiting  Prednisone Rash   Objective:   Today's Vitals   01/18/21 0813  BP: 122/76  Pulse: (!) 102  Temp: 97.9 F (36.6 C)  TempSrc: Temporal  SpO2: 99%  Weight: 153 lb 3.2 oz (69.5 kg)  Height: 5\' 6"  (1.676 m)   Body mass index is 24.73 kg/m.   General: Well developed, well nourished. No acute distress. Abdomen: Soft. Mild tenderness over the lower abdomen/suprapubic area L>R. No guarding or    rebound. Bowel sounds are positive, of normal pitch and frequency. Back: Straight. No CVA tenderness bilaterally. Psych: Alert and oriented. Normal mood and affect.  Health Maintenance Due  Topic Date Due   Pneumococcal Vaccine 39-99 Years old (1 - PCV) Never done   HIV Screening  Never done   Hepatitis C Screening   Never done   TETANUS/TDAP  Never done   Zoster Vaccines- Shingrix (1 of 2) Never done   COLONOSCOPY (Pts 45-14yrs Insurance coverage will need to be confirmed)  Never done   COVID-19 Vaccine (4 - Booster for Pfizer series) 07/22/2020     Lab results: Urine dipstick shows negative for all components.  Assessment & Plan:   1. Dysuria Urine dipstick does not show any indication of a UTI. I recommend Ms. Gust increase her fluid intake over the next few days and watch this. I anticipate her symptoms will resolve. If not improved or worsening, she should return for re-evaluation.  - POCT Urinalysis Dipstick    Haydee Salter, MD

## 2021-02-07 ENCOUNTER — Telehealth: Payer: BC Managed Care – PPO | Admitting: Physician Assistant

## 2021-02-07 DIAGNOSIS — U071 COVID-19: Secondary | ICD-10-CM | POA: Diagnosis not present

## 2021-02-07 DIAGNOSIS — J40 Bronchitis, not specified as acute or chronic: Secondary | ICD-10-CM | POA: Diagnosis not present

## 2021-02-07 MED ORDER — BENZONATATE 100 MG PO CAPS: 100.0000 mg | ORAL_CAPSULE | Freq: Three times a day (TID) | ORAL | 0 refills | Status: DC | PRN

## 2021-02-07 MED ORDER — AZITHROMYCIN 250 MG PO TABS: ORAL_TABLET | ORAL | 0 refills | Status: AC

## 2021-02-07 NOTE — Progress Notes (Signed)
Virtual Visit Consent   [place], you are scheduled for a virtual visit with a [facility] provider today.     Just as with appointments in the office, your consent must be obtained to participate.  Your consent will be active for this visit and any virtual visit you may have with one of our providers in the next 365 days.     If you have a MyChart account, a copy of this consent can be sent to you electronically.  All virtual visits are billed to your insurance company just like a traditional visit in the office.    As this is a virtual visit, video technology does not allow for your provider to perform a traditional examination.  This may limit your provider's ability to fully assess your condition.  If your provider identifies any concerns that need to be evaluated in person or the need to arrange testing (such as labs, EKG, etc.), we will make arrangements to do so.     Although advances in technology are sophisticated, we cannot ensure that it will always work on either your end or our end.  If the connection with a video visit is poor, the visit may have to be switched to a telephone visit.  With either a video or telephone visit, we are not always able to ensure that we have a secure connection.     I need to obtain your verbal consent now.   Are you willing to proceed with your visit today?    [name] has provided verbal consent on 02/07/2021 for a virtual visit (video or telephone).   Leeanne Rio, [place]   Date: 02/07/2021 4:23 PM   Virtual Visit via Video Note   I, Leeanne Rio, connected with  Wanda Cortez  ([id number], 1971/03/19) on 02/07/21 at  3:45 PM EDT by a video-enabled telemedicine application and verified that I am speaking with the correct person using two identifiers.  Location: Patient: Virtual Visit Location Patient: Home Provider: Virtual Visit Location Provider: Home Office   I discussed the limitations of evaluation and management by  telemedicine and the availability of in person appointments. The patient expressed understanding and agreed to proceed.    History of Present Illness: Wanda Cortez is a 50 y.o. who identifies as a female who was assigned female at birth, and is being seen today for continued cough status post COVID-19 infection.  Patient endorses symptom onset 01/27/2021 with positive COVID test on 01/30/2021.  States that most of her symptoms have resolved but was still with lingering cough which over the past 4 to 5 days has now become productive of thick yellow phlegm.  Does have history of asthma and has been using her medications as directed but still noting a mild increase in episodes of chest tightness.  Also noticing some chest wall tenderness.  Has been keeping a check on her oxygen levels which are averaging 94 to 95% on room air.  States the cough is keeping her awake at night and so she is not getting much rest.  Is supposed to return to work (although working at home) tomorrow and is wanting to know what else she can take for her symptoms.  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   Uterine leiomyoma 03/21/2016   Allergy to insect stings 12/27/2014    Allergies:  Allergies  Allergen Reactions   Loratadine Palpitations   Wasp Venom Itching and Hives   Lorazepam Nausea And Vomiting   Prednisone Rash   Medications:  Current Outpatient Medications:    albuterol (PROAIR HFA) 108 (90 Base) MCG/ACT inhaler, Inhale 2 puffs into the lungs every 6 (six) hours as needed for wheezing or  shortness of breath., Disp: 1 each, Rfl: 0   Ascorbic Acid (VITAMIN C) 100 MG CHEW, Vitamin C, Disp: , Rfl:    B Complex Vitamins (VITAMIN B COMPLEX) TABS, Take by mouth., Disp: , Rfl:    BLACK COHOSH-DONG QUAI PO, Take by mouth 2 times daily at 12 noon and 4 pm., Disp: , Rfl:    Cholecalciferol 125 MCG (5000 UT) capsule, Take by mouth., Disp: , Rfl:    EPINEPHrine 0.3 mg/0.3 mL IJ SOAJ injection, epinephrine 0.3 mg/0.3 mL injection, auto-injector, Disp: 1 each, Rfl: 2   ferrous gluconate (FERGON) 324 MG tablet, Take 324 mg by mouth daily with breakfast., Disp: , Rfl:    MULTIPLE VITAMINS-MINERALS ER PO, Take by mouth., Disp: , Rfl:    vitamin B-12 (CYANOCOBALAMIN) 100 MCG tablet, Vitamin B12  taking daily, Disp: , Rfl:    vitamin E 200 UNIT capsule, Take 200 Units by mouth daily., Disp: , Rfl:   Observations/Objective: Patient is well-developed, well-nourished in no acute distress.  Resting comfortably at home.  Head is normocephalic, atraumatic.  No labored breathing. Speech is clear and coherent with logical content.  Patient is alert and oriented at baseline.   Assessment and Plan: 1. Bronchitis due to COVID-19 virus Concern for secondary bacterial bronchitis status post COVID infection.  She is afebrile with good oxygen saturations so less concern for CAP.  We will have her continue to hydrate well and get plenty of rest.  Can use OTC plain Mucinex to help thin congestion.  Rx azithromycin and Tessalon.  Continue asthma medication.  She is intolerant to oral steroids so want to avoid these if at all possible.  Strict in office follow-up precautions discussed with patient who voiced understanding and agreement with the plan.  Follow Up Instructions: I discussed the assessment and treatment plan with the patient. The patient was provided an opportunity to ask questions and all were answered. The patient agreed with the plan and demonstrated an understanding of the instructions.  A copy of  instructions were sent to the patient via MyChart.  The patient was advised to call back or seek an in-person evaluation if the symptoms worsen or if the condition fails to improve as anticipated.  Time:  I spent 15 minutes with the patient via telehealth technology discussing the above problems/concerns.    Leeanne Rio, PA-C

## 2021-02-07 NOTE — Patient Instructions (Signed)
Ellorie J Mahar, thank you for joining Leeanne Rio, PA-C for today's virtual visit.  While this provider is not your primary care provider (PCP), if your PCP is located in our provider database this encounter information will be shared with them immediately following your visit.  Consent: (Patient) Wanda Cortez provided verbal consent for this virtual visit at the beginning of the encounter.  Current Medications:  Current Outpatient Medications:    azithromycin (ZITHROMAX) 250 MG tablet, Take 2 tablets on day 1, then 1 tablet daily on days 2 through 5, Disp: 6 tablet, Rfl: 0   benzonatate (TESSALON) 100 MG capsule, Take 1 capsule (100 mg total) by mouth 3 (three) times daily as needed for cough., Disp: 30 capsule, Rfl: 0   albuterol (PROAIR HFA) 108 (90 Base) MCG/ACT inhaler, Inhale 2 puffs into the lungs every 6 (six) hours as needed for wheezing or shortness of breath., Disp: 1 each, Rfl: 0   Ascorbic Acid (VITAMIN C) 100 MG CHEW, Vitamin C, Disp: , Rfl:    B Complex Vitamins (VITAMIN B COMPLEX) TABS, Take by mouth., Disp: , Rfl:    BLACK COHOSH-DONG QUAI PO, Take by mouth 2 times daily at 12 noon and 4 pm., Disp: , Rfl:    Cholecalciferol 125 MCG (5000 UT) capsule, Take by mouth., Disp: , Rfl:    EPINEPHrine 0.3 mg/0.3 mL IJ SOAJ injection, epinephrine 0.3 mg/0.3 mL injection, auto-injector, Disp: 1 each, Rfl: 2   ferrous gluconate (FERGON) 324 MG tablet, Take 324 mg by mouth daily with breakfast., Disp: , Rfl:    MULTIPLE VITAMINS-MINERALS ER PO, Take by mouth., Disp: , Rfl:    vitamin B-12 (CYANOCOBALAMIN) 100 MCG tablet, Vitamin B12  taking daily, Disp: , Rfl:    vitamin E 200 UNIT capsule, Take 200 Units by mouth daily., Disp: , Rfl:    Medications ordered in this encounter:  Meds ordered this encounter  Medications   azithromycin (ZITHROMAX) 250 MG tablet    Sig: Take 2 tablets on day 1, then 1 tablet daily on days 2 through 5    Dispense:  6 tablet    Refill:  0     Order Specific Question:   Supervising Provider    Answer:   Sabra Heck, BRIAN [[id number]]   benzonatate (TESSALON) 100 MG capsule    Sig: Take 1 capsule (100 mg total) by mouth 3 (three) times daily as needed for cough.    Dispense:  30 capsule    Refill:  0    Order Specific Question:   Supervising Provider    Answer:   Sabra Heck, Hydro     *If you need refills on other medications prior to your next appointment, please contact your pharmacy*  Follow-Up: Call back or seek an in-person evaluation if the symptoms worsen or if the condition fails to improve as anticipated.  Other Instructions Take antibiotic (azithromycin) as directed.  Increase fluids.  Get plenty of rest. Use Mucinex for congestion.  Take the Tessalon as directed for cough. Take a daily probiotic (I recommend Align or Culturelle, but even Activia Yogurt may be beneficial).  A humidifier placed in the bedroom may offer some relief for a dry, scratchy throat of nasal irritation.  Read information below on acute bronchitis.  Acute Bronchitis Bronchitis is when the airways that extend from the windpipe into the lungs get red, puffy, and painful (inflamed). Bronchitis often causes thick spit (mucus) to develop. This leads to a cough. A cough is the  most common symptom of bronchitis. In acute bronchitis, the condition usually begins suddenly and goes away over time (usually in 2 weeks). Smoking, allergies, and asthma can make bronchitis worse. Repeated episodes of bronchitis may cause more lung problems.  HOME CARE Rest. Drink enough fluids to keep your pee (urine) clear or pale yellow (unless you need to limit fluids as told by your doctor). Only take over-the-counter or prescription medicines as told by your doctor. Avoid smoking and secondhand smoke. These can make bronchitis worse. If you are a smoker, think about using nicotine gum or skin patches. Quitting smoking will help your lungs heal faster. Reduce the chance of getting  bronchitis again by: Washing your hands often. Avoiding people with cold symptoms. Trying not to touch your hands to your mouth, nose, or eyes. Follow up with your doctor as told.  GET HELP IF: Your symptoms do not improve after 1 week of treatment. Symptoms include: Cough. Fever. Coughing up thick spit. Body aches. Chest congestion. Chills. Shortness of breath. Sore throat.  GET HELP RIGHT AWAY IF:  You have an increased fever. You have chills. You have severe shortness of breath. You have bloody thick spit (sputum). You throw up (vomit) often. You lose too much body fluid (dehydration). You have a severe headache. You faint.  MAKE SURE YOU:  Understand these instructions. Will watch your condition. Will get help right away if you are not doing well or get worse. Document Released: 12/25/2007 Document Revised: 03/10/2013 Document Reviewed: 12/29/2012 [facility] Patient Information 2015 [place], [place]. This information is not intended to replace advice given to you by your health care provider. Make sure you discuss any questions you have with your health care provider.    If you have been instructed to have an in-person evaluation today at a local Urgent Care facility, please use the link below. It will take you to a list of all of our available [facility], including address, phone number and hours of operation. Please do not delay care.  [facility]  If you or a family member do not have a primary care provider, use the link below to schedule a visit and establish care. When you choose a [facility] primary care physician or advanced practice provider, you gain a long-term partner in health. Find a Primary Care Provider  Learn more about 's in-office and virtual care options: [facility]

## 2021-02-09 ENCOUNTER — Telehealth: Payer: Self-pay | Admitting: Family Medicine

## 2021-02-09 NOTE — Telephone Encounter (Signed)
Pt called and said she forgot to ask for a refill on diflucan which she usually gets when she gets an antibiotic, she wanted to know if it can be sent in to the same pharmacy the others were sent yesterday

## 2021-02-12 ENCOUNTER — Encounter: Payer: Self-pay | Admitting: Emergency Medicine

## 2021-02-12 ENCOUNTER — Ambulatory Visit
Admission: EM | Admit: 2021-02-12 | Discharge: 2021-02-12 | Disposition: A | Discharge status: Home / Self Care | Payer: BC Managed Care – PPO | Attending: Emergency Medicine | Admitting: Emergency Medicine

## 2021-02-12 ENCOUNTER — Other Ambulatory Visit: Payer: Self-pay

## 2021-02-12 DIAGNOSIS — R059 Cough, unspecified: Secondary | ICD-10-CM | POA: Diagnosis not present

## 2021-02-12 DIAGNOSIS — R058 Other specified cough: Secondary | ICD-10-CM | POA: Diagnosis not present

## 2021-02-12 MED ORDER — HYDROCODONE BIT-HOMATROP MBR 5-1.5 MG/5ML PO SOLN: 5.0000 mL | Freq: Every evening | ORAL | 0 refills | Status: DC | PRN

## 2021-02-12 NOTE — ED Provider Notes (Signed)
[facility]    CSN: [id number] Arrival date & time: 02/12/21  1345      History   Chief Complaint Chief Complaint  Patient presents with   Sore Throat   Otalgia    HPI Wanda Cortez is a 50 y.o. female presenting today for evaluation of chest pain.  Patient reports that her symptoms started on 7/9 and was positive for COVID on 7/12.  Today has had left-sided chest discomfort radiating into back with associated sore throat and ear pain.  Reports history of asthma, but denies any chest tightness shortness of breath or wheezing.  She has used Phenergan DM as well as Tessalon without relief of symptoms.  She reports the discomfort is constant, denies significant worsening with inspiration, coughing.  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      Home Medications    Prior to Admission medications   Medication Sig Start Date End Date Taking? Authorizing Provider  HYDROcodone bit-homatropine (HYCODAN) 5-1.5 MG/5ML syrup Take 5 mLs by mouth at bedtime as needed for cough. 02/12/21  Yes Shenoa Hattabaugh C, PA-C  albuterol (PROAIR HFA) 108 (90 Base) MCG/ACT inhaler Inhale 2 puffs into the lungs every 6 (six) hours as needed for wheezing or shortness of breath. 08/24/20   Lucretia Kern, DO   Ascorbic Acid (VITAMIN C) 100 MG CHEW Vitamin C    [provider]  B Complex Vitamins (VITAMIN B COMPLEX) TABS Take by mouth.    [provider]  benzonatate (TESSALON) 100 MG capsule Take 1 capsule (100 mg total) by mouth 3 (three) times daily as needed for cough. 02/07/21   Brunetta Jeans, PA-C  BLACK COHOSH-DONG QUAI PO Take by mouth 2 times daily at 12 noon and 4 pm.    [provider]  Cholecalciferol 125 MCG (5000 UT) capsule Take by mouth.    [provider]  EPINEPHrine 0.3 mg/0.3 mL IJ SOAJ injection epinephrine 0.3 mg/0.3 mL injection, auto-injector 12/29/19   Cirigliano, Mary K, DO  ferrous gluconate (FERGON) 324 MG tablet Take 324 mg by mouth daily with breakfast.    [provider]  MULTIPLE VITAMINS-MINERALS ER PO Take by mouth.    [provider]  vitamin B-12 (CYANOCOBALAMIN) 100 MCG tablet Vitamin B12  taking daily    [provider]  vitamin E 200 UNIT capsule Take 200 Units by mouth daily.    [provider]    Family History Family History  Problem Relation Age of Onset   Breast cancer Mother    Stroke Father    Hypertension Father    Arthritis Sister    Heart disease Paternal Grandmother    Heart disease Paternal Grandfather  Social History Social History   Tobacco Use   Smoking status: Never   Smokeless tobacco: Never  Substance Use Topics   Alcohol use: No   Drug use: No     Allergies   Loratadine, Wasp venom, Lorazepam, and Prednisone   Review of Systems Review of Systems  Constitutional:  Negative for activity change, appetite change, chills, fatigue and fever.  HENT:  Positive for congestion and rhinorrhea. Negative for ear pain, sinus pressure, sore throat and trouble swallowing.   Eyes:  Negative for discharge and redness.  Respiratory:  Positive for cough. Negative for chest tightness and shortness of breath.   Cardiovascular:  Positive for chest pain.   Gastrointestinal:  Negative for abdominal pain, diarrhea, nausea and vomiting.  Musculoskeletal:  Negative for myalgias.  Skin:  Negative for rash.  Neurological:  Negative for dizziness, light-headedness and headaches.    Physical Exam Triage Vital Signs ED Triage Vitals  Enc Vitals Group     BP 02/12/21 1439 133/81     Pulse Rate 02/12/21 1439 76     Resp 02/12/21 1439 18     Temp 02/12/21 1439 98.2 F (36.8 C)     Temp Source 02/12/21 1439 Oral     SpO2 02/12/21 1439 96 %     Weight --      Height --      Head Circumference --      Peak Flow --      Pain Score 02/12/21 1438 5     Pain Loc --      Pain Edu? --      Excl. in [place]? --    No data found.  Updated Vital Signs BP 133/81   Pulse 76   Temp 98.2 F (36.8 C) (Oral)   Resp 18   SpO2 96%   Visual Acuity Right Eye Distance:   Left Eye Distance:   Bilateral Distance:    Right Eye Near:   Left Eye Near:    Bilateral Near:     Physical Exam Vitals and nursing note reviewed.  Constitutional:      Appearance: She is well-developed.     Comments: No acute distress  HENT:     Head: Normocephalic and atraumatic.     Ears:     Comments: Bilateral ears without tenderness to palpation of external auricle, tragus and mastoid, EAC's without erythema or swelling, TM's with good bony landmarks and cone of light. Non erythematous.      Nose: Nose normal.     Mouth/Throat:     Comments: Oral mucosa pink and moist, no tonsillar enlargement or exudate. Posterior pharynx patent and nonerythematous, no uvula deviation or swelling. Normal phonation.  Eyes:     Conjunctiva/sclera: Conjunctivae normal.  Cardiovascular:     Rate and Rhythm: Normal rate and regular rhythm.  Pulmonary:     Effort: Pulmonary effort is normal. No respiratory distress.     Comments: Breathing comfortably at rest, CTABL, no wheezing, rales or other adventitious sounds auscultated  Abdominal:     General: There is no distension.   Musculoskeletal:        General: Normal range of motion.     Cervical back: Neck supple.  Skin:    General: Skin is warm and dry.  Neurological:     Mental Status: She is alert and oriented to person, place, and time.     UC Treatments / Results  Labs (all labs ordered are listed, but only abnormal results  are displayed) Labs Reviewed - No data to display  EKG   Radiology No results found.  Procedures Procedures (including critical care time)  Medications Ordered in UC Medications - No data to display  Initial Impression / Assessment and Plan / UC Course  I have reviewed the triage vital signs and the nursing notes.  Pertinent labs & imaging results that were available during my care of the patient were reviewed by me and considered in my medical decision making (see chart for details).    Cough times weeks after COVID, overall symptoms gradually improving, but with persistent cough.  Lungs clear to auscultation today, oxygen 96%, to discuss possibility of chest x-ray today with patient given persistent symptoms, but opted to defer and continue to treat cough.  Likely viral.  Providing Hycodan for trial at bedtime, continue inhaler as needed.  Deferring any steroids.  Discussed strict return precautions. Patient verbalized understanding and is agreeable with plan.  Final Clinical Impressions(s) / UC Diagnoses   Final diagnoses:  Cough  Post-viral cough syndrome     Discharge Instructions      Chest x-ray order placed Use inhaler as needed Continue Robitussin-DM during the day May try Hycodan at bedtime Rest and fluids Honey and hot tea Follow-up if continuing to not improve or worsen     ED Prescriptions     Medication Sig Dispense Auth. Provider   HYDROcodone bit-homatropine (HYCODAN) 5-1.5 MG/5ML syrup Take 5 mLs by mouth at bedtime as needed for cough. 100 mL Alexianna Nachreiner, [place] C, PA-C      PDMP not reviewed this encounter.   Janith Lima,  [place] 02/13/21 [phone]

## 2021-02-12 NOTE — ED Triage Notes (Signed)
Pt  is present today with left side chest pain that radiates to her back, sore throat, and left ear pain. Pt states that her sx started 7/9 and tested positive for Covid 7/12.

## 2021-02-12 NOTE — Discharge Instructions (Addendum)
Chest x-ray order placed Use inhaler as needed Continue Robitussin-DM during the day May try Hycodan at bedtime Rest and fluids Honey and hot tea Follow-up if continuing to not improve or worsen

## 2021-02-14 ENCOUNTER — Telehealth: Payer: Self-pay | Admitting: Family Medicine

## 2021-02-14 NOTE — Telephone Encounter (Signed)
Pt was rx'd azithromycin on 02/07/21 during VV with Raiford Noble, PA. Is she having symptoms c/w yeast infection - vaginal discharge, itching, odor?

## 2021-02-14 NOTE — Telephone Encounter (Signed)
Pt is wanting a cb.

## 2021-02-14 NOTE — Telephone Encounter (Signed)
LVM for patient to return call. 

## 2021-02-14 NOTE — Telephone Encounter (Signed)
Pt states she has tenderness, discharge and vaginal itching.

## 2021-02-14 NOTE — Telephone Encounter (Signed)
error 

## 2021-02-15 MED ORDER — FLUCONAZOLE 150 MG PO TABS
ORAL_TABLET | ORAL | 0 refills | Status: DC
Start: 2021-02-15 — End: 2021-03-16

## 2021-02-15 NOTE — Addendum Note (Signed)
Addended by: Ronnald Nian on: 02/15/2021 07:51 AM   Modules accepted: Orders

## 2021-02-15 NOTE — Telephone Encounter (Signed)
Rx sent for diflucan

## 2021-02-21 ENCOUNTER — Encounter: Payer: Self-pay | Admitting: Nurse Practitioner

## 2021-02-21 ENCOUNTER — Telehealth: Payer: BC Managed Care – PPO | Admitting: Nurse Practitioner

## 2021-02-21 DIAGNOSIS — J40 Bronchitis, not specified as acute or chronic: Secondary | ICD-10-CM | POA: Diagnosis not present

## 2021-02-21 MED ORDER — BUDESONIDE-FORMOTEROL FUMARATE 160-4.5 MCG/ACT IN AERO: 2.0000 | INHALATION_SPRAY | Freq: Two times a day (BID) | RESPIRATORY_TRACT | 3 refills | Status: DC

## 2021-02-21 NOTE — Progress Notes (Signed)
Virtual Visit Consent   Wanda Cortez, you are scheduled for a virtual visit with Mary-Margaret Hassell Done, FNP, a [facility] provider, today.     Just as with appointments in the office, your consent must be obtained to participate.  Your consent will be active for this visit and any virtual visit you may have with one of our providers in the next 365 days.     If you have a MyChart account, a copy of this consent can be sent to you electronically.  All virtual visits are billed to your insurance company just like a traditional visit in the office.    As this is a virtual visit, video technology does not allow for your provider to perform a traditional examination.  This may limit your provider's ability to fully assess your condition.  If your provider identifies any concerns that need to be evaluated in person or the need to arrange testing (such as labs, EKG, etc.), we will make arrangements to do so.     Although advances in technology are sophisticated, we cannot ensure that it will always work on either your end or our end.  If the connection with a video visit is poor, the visit may have to be switched to a telephone visit.  With either a video or telephone visit, we are not always able to ensure that we have a secure connection.     I need to obtain your verbal consent now.   Are you willing to proceed with your visit today? YES   Wanda Cortez has provided verbal consent on 02/21/2021 for a virtual visit (video or telephone).   Mary-Margaret Hassell Done, FNP   Date: 02/21/2021 8:50 AM   Virtual Visit via Video Note   I, Mary-Margaret Hassell Done, connected with Wanda Cortez, [birth year]) on 02/21/21 at  9:00 AM EDT by a video-enabled telemedicine application and verified that I am speaking with the correct person using two identifiers.  Location: Patient: Virtual Visit Location Patient: Home Provider: Virtual Visit Location Provider: Mobile   I discussed the limitations of  evaluation and management by telemedicine and the availability of in person appointments. The patient expressed understanding and agreed to proceed.    History of Present Illness: Wanda Cortez is a 50 y.o. who identifies as a female who was assigned female at birth, and is being seen today for covid in past.  HPI: Patient states she had covid Jult 9th, was treated with OTC meds. Developed bonchitis on July 17 and was treated with zpak. Cough has not resolved. Still has deep wet cough.    Review of Systems  Constitutional:  Positive for fever (low grade at 99).  HENT:  Negative for congestion and sore throat.   Respiratory:  Positive for cough and sputum production. Negative for shortness of breath.   Musculoskeletal:  Negative for myalgias.  Neurological:  Negative for headaches.   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   Uterine leiomyoma 03/21/2016   Allergy to insect stings 12/27/2014    Allergies:  Allergies  Allergen  Reactions   Loratadine Palpitations   Wasp Venom Itching and Hives   Lorazepam Nausea And Vomiting   Prednisone Rash   Medications:  Current Outpatient Medications:    albuterol (PROAIR HFA) 108 (90 Base) MCG/ACT inhaler, Inhale 2 puffs into the lungs every 6 (six) hours as needed for wheezing or shortness of breath., Disp: 1 each, Rfl: 0   Ascorbic Acid (VITAMIN C) 100 MG CHEW, Vitamin C, Disp: , Rfl:    B Complex Vitamins (VITAMIN B COMPLEX) TABS, Take by mouth., Disp: , Rfl:    benzonatate (TESSALON) 100 MG capsule, Take 1 capsule (100 mg total) by  mouth 3 (three) times daily as needed for cough., Disp: 30 capsule, Rfl: 0   BLACK COHOSH-DONG QUAI PO, Take by mouth 2 times daily at 12 noon and 4 pm., Disp: , Rfl:    Cholecalciferol 125 MCG (5000 UT) capsule, Take by mouth., Disp: , Rfl:    EPINEPHrine 0.3 mg/0.3 mL IJ SOAJ injection, epinephrine 0.3 mg/0.3 mL injection, auto-injector, Disp: 1 each, Rfl: 2   ferrous gluconate (FERGON) 324 MG tablet, Take 324 mg by mouth daily with breakfast., Disp: , Rfl:    fluconazole (DIFLUCAN) 150 MG tablet, 1 tab po x 1 dose, repeat x 1 dose in 48-72hrs, Disp: 2 tablet, Rfl: 0   HYDROcodone bit-homatropine (HYCODAN) 5-1.5 MG/5ML syrup, Take 5 mLs by mouth at bedtime as needed for cough., Disp: 100 mL, Rfl: 0   MULTIPLE VITAMINS-MINERALS ER PO, Take by mouth., Disp: , Rfl:    vitamin B-12 (CYANOCOBALAMIN) 100 MCG tablet, Vitamin B12  taking daily, Disp: , Rfl:    vitamin E 200 UNIT capsule, Take 200 Units by mouth daily., Disp: , Rfl:   Observations/Objective: Patient is well-developed, well-nourished in no acute distress.  Resting comfortably  at home.  Head is normocephalic, atraumatic.  No labored breathing.  Speech is clear and coherent with logical content.  Patient is alert and oriented at baseline.  Voice hoarse Deep dry cough  Assessment and Plan:  Wanda Cortez in today with chief complaint of Covid Positive   1. Bronchitis Patient gets rash from oral steroids- will prescribe symbicort along with her albuterol to see if will help with cough. Continue albuterol Continue cough meds asprescribed.   Follow Up Instructions: I discussed the assessment and treatment plan with the patient. The patient was provided an opportunity to ask questions and all were answered. The patient agreed with the plan and demonstrated an understanding of the instructions.  A copy of instructions were sent to the patient via MyChart.  The patient was advised to call back or seek an in-person evaluation if  the symptoms worsen or if the condition fails to improve as anticipated.  Time:  I spent 10 minutes with the patient via telehealth technology discussing the above problems/concerns.    Mary-Margaret Hassell Done, FNP

## 2021-03-01 ENCOUNTER — Ambulatory Visit
Admission: RE | Admit: 2021-03-01 | Discharge: 2021-03-01 | Disposition: A | Discharge status: Home / Self Care | Payer: BC Managed Care – PPO | Source: Ambulatory Visit | Attending: Obstetrics and Gynecology | Admitting: Obstetrics and Gynecology

## 2021-03-01 ENCOUNTER — Other Ambulatory Visit: Payer: Self-pay

## 2021-03-01 DIAGNOSIS — Z1231 Encounter for screening mammogram for malignant neoplasm of breast: Secondary | ICD-10-CM

## 2021-03-05 ENCOUNTER — Other Ambulatory Visit: Payer: Self-pay | Admitting: Obstetrics and Gynecology

## 2021-03-05 DIAGNOSIS — R928 Other abnormal and inconclusive findings on diagnostic imaging of breast: Secondary | ICD-10-CM

## 2021-03-12 ENCOUNTER — Ambulatory Visit
Admission: RE | Admit: 2021-03-12 | Discharge: 2021-03-12 | Disposition: A | Discharge status: Home / Self Care | Payer: BC Managed Care – PPO | Source: Ambulatory Visit | Attending: Obstetrics and Gynecology | Admitting: Obstetrics and Gynecology

## 2021-03-12 ENCOUNTER — Other Ambulatory Visit: Payer: Self-pay

## 2021-03-12 DIAGNOSIS — R928 Other abnormal and inconclusive findings on diagnostic imaging of breast: Secondary | ICD-10-CM

## 2021-03-15 ENCOUNTER — Other Ambulatory Visit: Payer: Self-pay

## 2021-03-16 ENCOUNTER — Ambulatory Visit (INDEPENDENT_AMBULATORY_CARE_PROVIDER_SITE_OTHER): Payer: BC Managed Care – PPO

## 2021-03-16 ENCOUNTER — Ambulatory Visit: Payer: BC Managed Care – PPO | Admitting: Family Medicine

## 2021-03-16 ENCOUNTER — Encounter: Payer: Self-pay | Admitting: Family Medicine

## 2021-03-16 VITALS — BP 116/68 | HR 89 | Temp 97.1°F | Ht 66.0 in | Wt 158.0 lb

## 2021-03-16 DIAGNOSIS — M25511 Pain in right shoulder: Secondary | ICD-10-CM | POA: Diagnosis not present

## 2021-03-16 DIAGNOSIS — M5412 Radiculopathy, cervical region: Secondary | ICD-10-CM | POA: Insufficient documentation

## 2021-03-16 DIAGNOSIS — M7521 Bicipital tendinitis, right shoulder: Secondary | ICD-10-CM | POA: Insufficient documentation

## 2021-03-16 DIAGNOSIS — M50122 Cervical disc disorder at C5-C6 level with radiculopathy: Secondary | ICD-10-CM | POA: Diagnosis not present

## 2021-03-16 DIAGNOSIS — M50121 Cervical disc disorder at C4-C5 level with radiculopathy: Secondary | ICD-10-CM | POA: Diagnosis not present

## 2021-03-16 DIAGNOSIS — M50123 Cervical disc disorder at C6-C7 level with radiculopathy: Secondary | ICD-10-CM | POA: Diagnosis not present

## 2021-03-16 MED ORDER — MELOXICAM 7.5 MG PO TABS: 7.5000 mg | ORAL_TABLET | Freq: Every day | ORAL | 0 refills | Status: DC

## 2021-03-16 MED ORDER — CYCLOBENZAPRINE HCL 10 MG PO TABS: 10.0000 mg | ORAL_TABLET | Freq: Every day | ORAL | 0 refills | Status: DC

## 2021-03-16 NOTE — Progress Notes (Signed)
Established Patient Office Visit  Subjective:  Patient ID: Wanda Cortez, female    DOB: 1970/10/14  Age: 50 y.o. MRN: [id number]  CC:  Chief Complaint  Patient presents with  . Pain    Injured right shoulder 2 weeks ago still in a lot of pain.     HPI Wanda Cortez presents for evaluation of right shoulder pain after she reached behind her to pick something up.  She does not think that she felt a pop.  Her shoulder has bothered her since that time.  Pain seems to be concentrated in the front part of her shoulder and moves posteriorly and laterally.  During this time she has also been experiencing tingling in the anterior lateral part of her hand involving the thumb and first 2 fingers.  She admits having some lateral neck discomfort.  She has treated it with Tylenol and massage which have helped some.  Denies neck injury.  She is a Art gallery manager.  Past Medical History:  Diagnosis Date  . Bronchitis   . Pleurisy     Past Surgical History:  Procedure Laterality Date  . APPENDECTOMY    . KNEE SURGERY Right 2018    Family History  Problem Relation Age of Onset  . Breast cancer Mother   . Stroke Father   . Hypertension Father   . Arthritis Sister   . Heart disease Paternal Grandmother   . Heart disease Paternal Grandfather     Social History   Socioeconomic History  . Marital status: Single    Spouse name: Not on file  . Number of children: Not on file  . Years of education: Not on file  . Highest education level: Not on file  Occupational History  . Not on file  Tobacco Use  . Smoking status: Never  . Smokeless tobacco: Never  Substance and Sexual Activity  . Alcohol use: No  . Drug use: No  . Sexual activity: Yes    Birth control/protection: None  Other Topics Concern  . Not on file  Social History Narrative  . Not on file   Social Determinants of Health   Financial Resource Strain: Not on file  Food Insecurity: Not on file  Transportation Needs: Not  on file  Physical Activity: Not on file  Stress: Not on file  Social Connections: Not on file  Intimate Partner Violence: Not on file    Outpatient Medications Prior to Visit  Medication Sig Dispense Refill  . albuterol (PROAIR HFA) 108 (90 Base) MCG/ACT inhaler Inhale 2 puffs into the lungs every 6 (six) hours as needed for wheezing or shortness of breath. 1 each 0  . Ascorbic Acid (VITAMIN C) 100 MG CHEW Vitamin C    . B Complex Vitamins (VITAMIN B COMPLEX) TABS Take by mouth.    Marland Kitchen BLACK COHOSH-DONG QUAI PO Take by mouth 2 times daily at 12 noon and 4 pm.    . Cholecalciferol 125 MCG (5000 UT) capsule Take by mouth.    . EPINEPHrine 0.3 mg/0.3 mL IJ SOAJ injection epinephrine 0.3 mg/0.3 mL injection, auto-injector 1 each 2  . ferrous gluconate (FERGON) 324 MG tablet Take 324 mg by mouth daily with breakfast.    . MULTIPLE VITAMINS-MINERALS ER PO Take by mouth.    . vitamin B-12 (CYANOCOBALAMIN) 100 MCG tablet Vitamin B12  taking daily    . budesonide-formoterol (SYMBICORT) 160-4.5 MCG/ACT inhaler Inhale 2 puffs into the lungs 2 (two) times daily. 1 each 3  .  vitamin E 200 UNIT capsule Take 200 Units by mouth daily.    . benzonatate (TESSALON) 100 MG capsule Take 1 capsule (100 mg total) by mouth 3 (three) times daily as needed for cough. 30 capsule 0  . fluconazole (DIFLUCAN) 150 MG tablet 1 tab po x 1 dose, repeat x 1 dose in 48-72hrs 2 tablet 0  . HYDROcodone bit-homatropine (HYCODAN) 5-1.5 MG/5ML syrup Take 5 mLs by mouth at bedtime as needed for cough. 100 mL 0   No facility-administered medications prior to visit.    Allergies  Allergen Reactions  . Loratadine Palpitations  . Wasp Venom Itching and Hives  . Lorazepam Nausea And Vomiting  . Prednisone Rash    ROS Review of Systems  Constitutional:  Negative for diaphoresis, fatigue, fever and unexpected weight change.  Respiratory: Negative.    Cardiovascular: Negative.   Gastrointestinal: Negative.   Musculoskeletal:   Positive for arthralgias and neck pain.  Neurological:  Positive for numbness. Negative for weakness.     Objective:    Physical Exam Vitals and nursing note reviewed.  Constitutional:      General: She is not in acute distress.    Appearance: Normal appearance. She is not ill-appearing, toxic-appearing or diaphoretic.  HENT:     Head: Normocephalic and atraumatic.  Pulmonary:     Effort: Pulmonary effort is normal.  Musculoskeletal:     Right shoulder: Tenderness present. Decreased range of motion. Normal strength.     Left shoulder: Normal.       Arms:     Cervical back: No spasms, tenderness or bony tenderness. No pain with movement. Normal range of motion.       Back:  Neurological:     Mental Status: She is alert and oriented to person, place, and time.     Motor: No weakness.    BP 116/68 (BP Location: Left Arm, Patient Position: Sitting, Cuff Size: Normal)   Pulse 89   Temp (!) 97.1 F (36.2 C) (Temporal)   Ht '5\' 6"'$  (1.676 m)   Wt 158 lb (71.7 kg)   SpO2 98%   BMI 25.50 kg/m  Wt Readings from Last 3 Encounters:  03/16/21 158 lb (71.7 kg)  01/18/21 153 lb 3.2 oz (69.5 kg)  01/11/21 149 lb 3.2 oz (67.7 kg)     Health Maintenance Due  Topic Date Due  . Pneumococcal Vaccine 36-24 Years old (1 - PCV) Never done  . HIV Screening  Never done  . Hepatitis C Screening  Never done  . TETANUS/TDAP  Never done  . Zoster Vaccines- Shingrix (1 of 2) Never done  . COLONOSCOPY (Pts 45-38yr Insurance coverage will need to be confirmed)  Never done  . INFLUENZA VACCINE  02/19/2021    There are no preventive care reminders to display for this patient.  Lab Results  Component Value Date   TSH 1.45 01/11/2021   Lab Results  Component Value Date   WBC 3.4 (L) 01/11/2021   HGB 13.7 01/11/2021   HCT 41.0 01/11/2021   MCV 92.0 01/11/2021   PLT 253.0 01/11/2021   Lab Results  Component Value Date   NA 142 10/13/2020   K 3.5 10/13/2020   CO2 27 10/13/2020    GLUCOSE 99 10/13/2020   BUN 10 10/13/2020   CREATININE 0.60 10/13/2020   BILITOT 0.5 10/13/2020   ALKPHOS 39 10/13/2020   AST 22 10/13/2020   ALT 24 10/13/2020   PROT 7.0 10/13/2020   ALBUMIN 4.3 10/13/2020  CALCIUM 9.2 10/13/2020   ANIONGAP 10 10/13/2020   GFR 95.05 12/02/2019   Lab Results  Component Value Date   CHOL 197 12/02/2019   Lab Results  Component Value Date   HDL 59.40 12/02/2019   Lab Results  Component Value Date   LDLCALC 122 (H) 12/02/2019   Lab Results  Component Value Date   TRIG 80.0 12/02/2019   Lab Results  Component Value Date   CHOLHDL 3 12/02/2019   No results found for: HGBA1C    Assessment & Plan:   Problem List Items Addressed This Visit       Nervous and Auditory   Cervical radiculopathy at C6   Relevant Medications   meloxicam (MOBIC) 7.5 MG tablet   cyclobenzaprine (FLEXERIL) 10 MG tablet   Other Relevant Orders   Ambulatory referral to Sports Medicine   DG Cervical Spine Complete     Musculoskeletal and Integument   Biceps tendinitis of right upper extremity - Primary   Relevant Medications   meloxicam (MOBIC) 7.5 MG tablet   Other Relevant Orders   Ambulatory referral to Sports Medicine    Meds ordered this encounter  Medications  . meloxicam (MOBIC) 7.5 MG tablet    Sig: Take 1 tablet (7.5 mg total) by mouth daily.    Dispense:  30 tablet    Refill:  0  . cyclobenzaprine (FLEXERIL) 10 MG tablet    Sig: Take 1 tablet (10 mg total) by mouth at bedtime. As needed.    Dispense:  30 tablet    Refill:  0    Follow-up: No follow-ups on file.    Libby Maw, MD

## 2021-03-19 ENCOUNTER — Encounter: Payer: Self-pay | Admitting: Family Medicine

## 2021-03-20 ENCOUNTER — Other Ambulatory Visit: Payer: BC Managed Care – PPO

## 2021-03-21 ENCOUNTER — Ambulatory Visit: Payer: Self-pay

## 2021-03-21 ENCOUNTER — Other Ambulatory Visit: Payer: Self-pay

## 2021-03-21 ENCOUNTER — Ambulatory Visit: Payer: BC Managed Care – PPO | Admitting: Family Medicine

## 2021-03-21 ENCOUNTER — Encounter: Payer: Self-pay | Admitting: Family Medicine

## 2021-03-21 VITALS — Ht 67.0 in | Wt 153.0 lb

## 2021-03-21 DIAGNOSIS — M25411 Effusion, right shoulder: Secondary | ICD-10-CM | POA: Diagnosis not present

## 2021-03-21 DIAGNOSIS — G5601 Carpal tunnel syndrome, right upper limb: Secondary | ICD-10-CM | POA: Diagnosis not present

## 2021-03-21 DIAGNOSIS — M25511 Pain in right shoulder: Secondary | ICD-10-CM

## 2021-03-21 NOTE — Progress Notes (Signed)
  Wanda Cortez - 50 y.o. female MRN [id number]  Date of birth: 28-May-1971  SUBJECTIVE:  Including CC & ROS.  No chief complaint on file.   Wanda Cortez is a 50 y.o. female that is presenting with right hand pain and right shoulder pain.  She felt a pop in the shoulder when she is moving a box of tomatoes closer.  Since that time she has had pain around the shoulder and pain in the first 3 digits palmar aspect of the right hand..  Independent review of the cervical spine x-ray from 8/26 shows significant degenerative changes at C5-6 and kissing osteophytes at C6/7.   Review of Systems See HPI   HISTORY: Past Medical, Surgical, Social, and Family History Reviewed & Updated per EMR.   Pertinent Historical Findings include:  Past Medical History:  Diagnosis Date   Bronchitis    Pleurisy     Past Surgical History:  Procedure Laterality Date   APPENDECTOMY     KNEE SURGERY Right 2018    Family History  Problem Relation Age of Onset   Breast cancer Mother    Stroke Father    Hypertension Father    Arthritis Sister    Heart disease Paternal Grandmother    Heart disease Paternal Grandfather     Social History   Socioeconomic History   Marital status: Single    Spouse name: Not on file   Number of children: Not on file   Years of education: Not on file   Highest education level: Not on file  Occupational History   Not on file  Tobacco Use   Smoking status: Never   Smokeless tobacco: Never  Substance and Sexual Activity   Alcohol use: No   Drug use: No   Sexual activity: Yes    Birth control/protection: None  Other Topics Concern   Not on file  Social History Narrative   Not on file   Social Determinants of Health   Financial Resource Strain: Not on file  Food Insecurity: Not on file  Transportation Needs: Not on file  Physical Activity: Not on file  Stress: Not on file  Social Connections: Not on file  Intimate Partner Violence: Not on file      PHYSICAL EXAM:  VS: Ht '5\' 7"'$  (1.702 m)   Wt 153 lb (69.4 kg)   BMI 23.96 kg/m  Physical Exam Gen: NAD, alert, cooperative with exam, well-appearing   Limited ultrasound: Right hand, right shoulder:  Right hand: Normal circumference of the median nerve with some flattening of the nerve.  Right shoulder: Mild effusion observed of the bicipital tendon sheath. Overlying effusion observed the subscapularis with dynamic testing. Mild degenerative changes of the supraspinatus.  Summary: Right shoulder with effusion suspected of emanating from the shoulder joint.  Ultrasound and interpretation by Clearance Coots, MD     ASSESSMENT & PLAN:   Carpal tunnel syndrome of right wrist She does have degenerative changes observed on cervical spine which could be contributing to the sensation that she is having at the hand.  Does have flattening of the median nerve which could be more of a carpal tunnel in origin. -Counseled on home exercise therapy and supportive care. -Could consider physical therapy or bracing or injection.  Shoulder effusion, right There does appear to be an effusion emanating from the joint.   -Counseled on home exercise therapy and supportive care. -Counseled on Voltaren. -Could consider physical therapy or further imaging.

## 2021-03-21 NOTE — Assessment & Plan Note (Signed)
She does have degenerative changes observed on cervical spine which could be contributing to the sensation that she is having at the hand.  Does have flattening of the median nerve which could be more of a carpal tunnel in origin. -Counseled on home exercise therapy and supportive care. -Could consider physical therapy or bracing or injection.

## 2021-03-21 NOTE — Patient Instructions (Signed)
Nice to meet you Please try ice  Please try the voltaren  Please try the exercises   Please send me a message in MyChart with any questions or updates.  Please see me back in 4 weeks.   --Dr. Raeford Razor

## 2021-03-21 NOTE — Assessment & Plan Note (Signed)
There does appear to be an effusion emanating from the joint.   -Counseled on home exercise therapy and supportive care. -Counseled on Voltaren. -Could consider physical therapy or further imaging.

## 2021-03-22 ENCOUNTER — Encounter: Payer: Self-pay | Admitting: Nurse Practitioner

## 2021-03-22 ENCOUNTER — Telehealth: Payer: BC Managed Care – PPO | Admitting: Nurse Practitioner

## 2021-03-22 DIAGNOSIS — M940 Chondrocostal junction syndrome [Tietze]: Secondary | ICD-10-CM

## 2021-03-22 NOTE — Progress Notes (Signed)
Virtual Visit Consent   Ravine, you are scheduled for a virtual visit with a [facility] provider today.     Just as with appointments in the office, your consent must be obtained to participate.  Your consent will be active for this visit and any virtual visit you may have with one of our providers in the next 365 days.     If you have a MyChart account, a copy of this consent can be sent to you electronically.  All virtual visits are billed to your insurance company just like a traditional visit in the office.    As this is a virtual visit, video technology does not allow for your provider to perform a traditional examination.  This may limit your provider's ability to fully assess your condition.  If your provider identifies any concerns that need to be evaluated in person or the need to arrange testing (such as labs, EKG, etc.), we will make arrangements to do so.     Although advances in technology are sophisticated, we cannot ensure that it will always work on either your end or our end.  If the connection with a video visit is poor, the visit may have to be switched to a telephone visit.  With either a video or telephone visit, we are not always able to ensure that we have a secure connection.     I need to obtain your verbal consent now.   Are you willing to proceed with your visit today?    Evelisse Pacifico Nieman has provided verbal consent on 03/22/2021 for a virtual visit (video or telephone).   Apolonio Schneiders, FNP   Date: 03/22/2021 5:46 PM   Virtual Visit via Video Note   I, Apolonio Schneiders, connected with  Wanda Cortez  ([id number], April 17, 1971) on 03/22/21 at  6:00 PM EDT by a video-enabled telemedicine application and verified that I am speaking with the correct person using two identifiers.  Location: Patient: Virtual Visit Location Patient: Home Provider: Virtual Visit Location Provider: Office/Clinic   I discussed the limitations of evaluation and management by telemedicine and  the availability of in person appointments. The patient expressed understanding and agreed to proceed.    History of Present Illness: Wanda Cortez is a 50 y.o. who identifies as a female who was assigned female at birth, and is being seen today with complaints of a sharp pain in her chest that started at about midnight last night. This woke her up from sleep.  She was at sports medicine on the day prior to pain onset. She was having an assessment and ultrasound to assess for right shoulder injury. Her shoulder injury has prevented her from exercising recently. She denies any recent heavy lifting or new activities otherwise.  Works as a Hydrographic surveyor.  Denies any SOB, heart racing or palpitations during pain onset.   Had a similar symptom in 2018 went to ED had cardiac workup was negative.   She had COVID in July and had a cough that lasted about 6 weeks only recent resolved after being treated for bronchitis.    She cannot reproduce the pain with a deep breath. Is able to take deep breaths without difficulty  She can reproduce the pain by pushing on her chest wall   Problems:  Patient Active Problem List   Diagnosis Date Noted   Shoulder effusion, right 03/21/2021   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/21/2021   Biceps tendinitis of right upper extremity  03/16/2021   Cervical radiculopathy at C6 03/16/2021   Menopausal flushing 07/06/2020   Insomnia 07/06/2020   Lipoma of left lower extremity 01/04/2020   Cervical cancer ([place]) 07/12/2019   Polyp of cervix 07/12/2019   Tear of right scapholunate ligament 03/30/2019   Essential hypertension 02/25/2019   Chronic pain of right knee 09/22/2018   Acute lateral meniscus tear of left knee 06/10/2017   Submucous leiomyoma of uterus 03/21/2016   Uterine leiomyoma 03/21/2016   Allergy to insect stings 12/27/2014    Allergies:  Allergies  Allergen Reactions   Loratadine Palpitations   Wasp Venom Itching and Hives   Lorazepam  Nausea And Vomiting   Meloxicam Swelling   Prednisone Rash   Current Outpatient Medications  Medication Instructions   albuterol (PROAIR HFA) 108 (90 Base) MCG/ACT inhaler 2 puffs, Inhalation, Every 6 hours PRN   Ascorbic Acid (VITAMIN C) 100 MG CHEW Vitamin C   B Complex Vitamins (VITAMIN B COMPLEX) TABS Oral   BLACK COHOSH-DONG QUAI PO Oral, 2 times daily   Cholecalciferol 125 MCG (5000 UT) capsule Oral   cyclobenzaprine (FLEXERIL) 10 mg, Oral, Daily at bedtime, As needed.   EPINEPHrine 0.3 mg/0.3 mL IJ SOAJ injection epinephrine 0.3 mg/0.3 mL injection, auto-injector   ferrous gluconate (FERGON) 324 mg, Oral, Daily with breakfast   MULTIPLE VITAMINS-MINERALS ER PO Oral   vitamin B-12 (CYANOCOBALAMIN) 100 MCG tablet Vitamin B12  taking daily     Observations/Objective: Patient is well-developed, well-nourished in no acute distress.  Resting comfortably at home.  Head is normocephalic, atraumatic.  No labored breathing.  Speech is clear and coherent with logical content.  Patient is alert and oriented at baseline.    Assessment and Plan: 1. Costochondritis Advised topical Icy hot or Bengay for relief.  Given recent reaction to Meloxicam and history of reaction to prednisone would not advice NSAIDs at this time. She may use tylenol for pain relief.     If symptoms persist or with any new or worsening symptoms as discussed she will follow up or seek immediate medical attention for any acutely worsening symptoms   Follow Up Instructions: I discussed the assessment and treatment plan with the patient. The patient was provided an opportunity to ask questions and all were answered. The patient agreed with the plan and demonstrated an understanding of the instructions.  A copy of instructions were sent to the patient via MyChart.  The patient was advised to call back or seek an in-person evaluation if the symptoms worsen or if the condition fails to improve as anticipated.  Time:   I spent 15 minutes with the patient via telehealth technology discussing the above problems/concerns.    Apolonio Schneiders, FNP

## 2021-04-12 ENCOUNTER — Ambulatory Visit: Payer: BC Managed Care – PPO | Admitting: Family Medicine

## 2021-04-26 ENCOUNTER — Telehealth: Payer: Self-pay | Admitting: *Deleted

## 2021-04-26 ENCOUNTER — Ambulatory Visit: Payer: BC Managed Care – PPO | Admitting: Family Medicine

## 2021-04-26 ENCOUNTER — Other Ambulatory Visit: Payer: Self-pay | Admitting: *Deleted

## 2021-04-26 ENCOUNTER — Encounter: Payer: Self-pay | Admitting: Family Medicine

## 2021-04-26 VITALS — Ht 67.0 in | Wt 153.0 lb

## 2021-04-26 DIAGNOSIS — M25411 Effusion, right shoulder: Secondary | ICD-10-CM | POA: Diagnosis not present

## 2021-04-26 DIAGNOSIS — G5601 Carpal tunnel syndrome, right upper limb: Secondary | ICD-10-CM

## 2021-04-26 MED ORDER — DICLOFENAC SODIUM 2 % EX SOLN: 1.0000 "application " | Freq: Two times a day (BID) | CUTANEOUS | 2 refills | Status: DC

## 2021-04-26 NOTE — Assessment & Plan Note (Signed)
Has gotten improvement with the stability exercises. -Counseled on home exercise therapy and supportive care. -Referral to physical therapy. -Could consider injection.

## 2021-04-26 NOTE — Patient Instructions (Signed)
Good to see you Please try the brace at night  Please try the pennsaid   Please send me a message in MyChart with any questions or updates.  Please see me back as needed or when you need [place].   --Dr. Raeford Razor

## 2021-04-26 NOTE — Progress Notes (Signed)
  Wanda Cortez - 50 y.o. female MRN [id number]  Date of birth: July 12, 1971  SUBJECTIVE:  Including CC & ROS.  No chief complaint on file.   Wanda Cortez is a 50 y.o. female that is following up for her carpal tunnel and right shoulder pain.  Her symptoms have been doing well but still occur intermittently in the hand and wrist.  Shoulder pain has improved.   Review of Systems See HPI   HISTORY: Past Medical, Surgical, Social, and Family History Reviewed & Updated per EMR.   Pertinent Historical Findings include:  Past Medical History:  Diagnosis Date   Bronchitis    Pleurisy     Past Surgical History:  Procedure Laterality Date   APPENDECTOMY     KNEE SURGERY Right 2018    Family History  Problem Relation Age of Onset   Breast cancer Mother    Stroke Father    Hypertension Father    Arthritis Sister    Heart disease Paternal Grandmother    Heart disease Paternal Grandfather     Social History   Socioeconomic History   Marital status: Single    Spouse name: Not on file   Number of children: Not on file   Years of education: Not on file   Highest education level: Not on file  Occupational History   Not on file  Tobacco Use   Smoking status: Never   Smokeless tobacco: Never  Substance and Sexual Activity   Alcohol use: No   Drug use: No   Sexual activity: Yes    Birth control/protection: None  Other Topics Concern   Not on file  Social History Narrative   Not on file   Social Determinants of Health   Financial Resource Strain: Not on file  Food Insecurity: Not on file  Transportation Needs: Not on file  Physical Activity: Not on file  Stress: Not on file  Social Connections: Not on file  Intimate Partner Violence: Not on file     PHYSICAL EXAM:  VS: Ht 5\' 7"  (1.702 m)   Wt 153 lb (69.4 kg)   BMI 23.96 kg/m  Physical Exam Gen: NAD, alert, cooperative with exam, well-appearing      ASSESSMENT & PLAN:   Shoulder effusion, right Has  gotten improvement with the stability exercises. -Counseled on home exercise therapy and supportive care. -Referral to physical therapy. -Could consider injection.  Carpal tunnel syndrome of right wrist Pain still occurring intermittently -Counseled on home exercise therapy and supportive care. -Brace counseled on its use. -Pennsaid. -Referral to physical therapy. -Could consider injection.

## 2021-04-26 NOTE — Telephone Encounter (Signed)
Morey Hummingbird from [place] calling to clarify patient's documented allergy to Meloxicam. I informed her the patient's chart states she experienced swelling with use of Meloxicam. I asked Dr. Raeford Razor if he wanted pharmacy to dispense Pennsaid. He states yes. Morey Hummingbird, pharmacist states patient could potentially have similar reactions to Pennsaid.   I I spoke to pt- she states she had facial and neck swelling with Meloxicam and she has not taken any NSAIDs since and she is not going to use Pennsaid. I called [facility] and informed Morey Hummingbird to not fill and dispense the Pennsaid to patient.

## 2021-04-26 NOTE — Assessment & Plan Note (Signed)
Pain still occurring intermittently -Counseled on home exercise therapy and supportive care. -Brace counseled on its use. -Pennsaid. -Referral to physical therapy. -Could consider injection.

## 2021-05-23 ENCOUNTER — Ambulatory Visit: Payer: BC Managed Care – PPO | Admitting: Nurse Practitioner

## 2021-05-24 ENCOUNTER — Ambulatory Visit: Payer: BC Managed Care – PPO | Admitting: Family Medicine

## 2021-05-24 ENCOUNTER — Encounter: Payer: Self-pay | Admitting: Family Medicine

## 2021-05-24 ENCOUNTER — Ambulatory Visit: Payer: Self-pay

## 2021-05-24 VITALS — BP 100/72 | Ht 67.0 in | Wt 150.0 lb

## 2021-05-24 DIAGNOSIS — S83281A Other tear of lateral meniscus, current injury, right knee, initial encounter: Secondary | ICD-10-CM | POA: Insufficient documentation

## 2021-05-24 DIAGNOSIS — M25611 Stiffness of right shoulder, not elsewhere classified: Secondary | ICD-10-CM | POA: Diagnosis not present

## 2021-05-24 DIAGNOSIS — M6281 Muscle weakness (generalized): Secondary | ICD-10-CM | POA: Diagnosis not present

## 2021-05-24 DIAGNOSIS — M25511 Pain in right shoulder: Secondary | ICD-10-CM | POA: Diagnosis not present

## 2021-05-24 DIAGNOSIS — M25411 Effusion, right shoulder: Secondary | ICD-10-CM | POA: Diagnosis not present

## 2021-05-24 DIAGNOSIS — M25561 Pain in right knee: Secondary | ICD-10-CM

## 2021-05-24 NOTE — Progress Notes (Signed)
  Wanda Cortez - 50 y.o. female MRN [id number]  Date of birth: 1971/07/08  SUBJECTIVE:  Including CC & ROS.  No chief complaint on file.   Wanda Cortez is a 50 y.o. female that is presenting with acute right knee pain. Was walking on the beach in [place] and felt pain in the knee afterwards. History of surgery in the knee. Pain is lateral in nature.    Review of Systems See HPI   HISTORY: Past Medical, Surgical, Social, and Family History Reviewed & Updated per EMR.   Pertinent Historical Findings include:  Past Medical History:  Diagnosis Date   Bronchitis    Pleurisy     Past Surgical History:  Procedure Laterality Date   APPENDECTOMY     KNEE SURGERY Right 2018    Family History  Problem Relation Age of Onset   Breast cancer Mother    Stroke Father    Hypertension Father    Arthritis Sister    Heart disease Paternal Grandmother    Heart disease Paternal Grandfather     Social History   Socioeconomic History   Marital status: Single    Spouse name: Not on file   Number of children: Not on file   Years of education: Not on file   Highest education level: Not on file  Occupational History   Not on file  Tobacco Use   Smoking status: Never   Smokeless tobacco: Never  Substance and Sexual Activity   Alcohol use: No   Drug use: No   Sexual activity: Yes    Birth control/protection: None  Other Topics Concern   Not on file  Social History Narrative   Not on file   Social Determinants of Health   Financial Resource Strain: Not on file  Food Insecurity: Not on file  Transportation Needs: Not on file  Physical Activity: Not on file  Stress: Not on file  Social Connections: Not on file  Intimate Partner Violence: Not on file     PHYSICAL EXAM:  VS: BP 100/72 (BP Location: Left Arm, Patient Position: Sitting)   Ht 5\' 7"  (1.702 m)   Wt 150 lb (68 kg)   BMI 23.49 kg/m  Physical Exam Gen: NAD, alert, cooperative with exam, well-appearing   Limited  ultrasound: right knee:  Mild effusion in suprapatellar pouch Normal appearing quadriceps and patellar tendon.  Mild medial joint space narrowing and outpouching of medial mensicus.  Degenerative changes of the lateral meniscus with increased hyperemia   Summary: degenerative meniscal lateral tear   Ultrasound and interpretation by Clearance Coots, MD     ASSESSMENT & PLAN:   Acute meniscal tear, lateral, right, initial encounter Acutely occurring for the past 2 weeks.  - counseled on home exercise therapy and supportive care - counseled on brace - could consider injection or physical therapy.

## 2021-05-24 NOTE — Assessment & Plan Note (Signed)
Acutely occurring for the past 2 weeks.  - counseled on home exercise therapy and supportive care - counseled on brace - could consider injection or physical therapy.

## 2021-05-24 NOTE — Patient Instructions (Signed)
Good to see you  Please try a brace  Please try tylenol  Please try ice  Please try the exercises  Please send me a message in MyChart with any questions or updates.  Please see me back in 4 weeks or as needed if better.   --Dr. Raeford Razor

## 2021-05-29 DIAGNOSIS — M25411 Effusion, right shoulder: Secondary | ICD-10-CM | POA: Diagnosis not present

## 2021-05-29 DIAGNOSIS — M6281 Muscle weakness (generalized): Secondary | ICD-10-CM | POA: Diagnosis not present

## 2021-05-29 DIAGNOSIS — M25611 Stiffness of right shoulder, not elsewhere classified: Secondary | ICD-10-CM | POA: Diagnosis not present

## 2021-05-29 DIAGNOSIS — M25511 Pain in right shoulder: Secondary | ICD-10-CM | POA: Diagnosis not present

## 2021-05-31 DIAGNOSIS — E611 Iron deficiency: Secondary | ICD-10-CM | POA: Diagnosis not present

## 2021-05-31 DIAGNOSIS — J42 Unspecified chronic bronchitis: Secondary | ICD-10-CM | POA: Diagnosis not present

## 2021-05-31 DIAGNOSIS — L659 Nonscarring hair loss, unspecified: Secondary | ICD-10-CM | POA: Diagnosis not present

## 2021-05-31 DIAGNOSIS — Z1211 Encounter for screening for malignant neoplasm of colon: Secondary | ICD-10-CM | POA: Diagnosis not present

## 2021-06-07 ENCOUNTER — Ambulatory Visit: Payer: BC Managed Care – PPO | Admitting: Nurse Practitioner

## 2021-06-07 ENCOUNTER — Encounter: Payer: Self-pay | Admitting: Nurse Practitioner

## 2021-06-07 ENCOUNTER — Other Ambulatory Visit: Payer: Self-pay

## 2021-06-07 VITALS — BP 98/62 | HR 84 | Temp 96.6°F | Ht 67.0 in | Wt 150.2 lb

## 2021-06-07 DIAGNOSIS — J45909 Unspecified asthma, uncomplicated: Secondary | ICD-10-CM | POA: Insufficient documentation

## 2021-06-07 DIAGNOSIS — Z91018 Allergy to other foods: Secondary | ICD-10-CM | POA: Diagnosis not present

## 2021-06-07 DIAGNOSIS — Z8709 Personal history of other diseases of the respiratory system: Secondary | ICD-10-CM | POA: Diagnosis not present

## 2021-06-07 MED ORDER — CETIRIZINE HCL 10 MG PO TABS: 10.0000 mg | ORAL_TABLET | Freq: Every day | ORAL | 0 refills | Status: DC

## 2021-06-07 MED ORDER — FAMOTIDINE 20 MG PO TABS: 20.0000 mg | ORAL_TABLET | Freq: Every day | ORAL | Status: DC

## 2021-06-07 MED ORDER — EPINEPHRINE 0.3 MG/0.3ML IJ SOAJ: INTRAMUSCULAR | 2 refills | Status: DC

## 2021-06-07 NOTE — Progress Notes (Signed)
Subjective:  Patient ID: Wanda Cortez, female    DOB: Nov 24, 1970  Age: 50 y.o. MRN: [id number]  CC: Establish Care (TOC-Dr. Cirigliano/Pt would like to discuss some food sensitivity issues she has been having. /Declines all vaccines at this time/)  Allergic Reaction This is a chronic problem. The current episode started more than 1 week ago (onset 2016). The problem occurs intermittently. The problem has been gradually worsening since onset. The problem is moderate. The patient was exposed to eggs and nuts (gluten). The time of exposure was just prior to onset. Incident location: at restaurant. Associated symptoms include chest pain, chest pressure, coughing, globus sensation and wheezing. Pertinent negatives include no abdominal pain, diarrhea, difficulty breathing, drooling, eye itching, eye redness, eye watering, hyperventilation, itching, rash, stridor, trouble swallowing or vomiting. There is no swelling present. Past treatments include diphenhydramine (and albuterol). The treatment provided significant relief. Her past medical history is significant for asthma, food allergies and medication allergies. There is no history of atopic dermatitis or seasonal allergies.  She maintains a pescaterian Reaction with eggs, almond, and gluten. She has already eliminated these items from her diet. She also avoids eating out of her home. She grows her own vegetable She is also lactose intolerate but able to tolerate cottage cheese Without use of bendaryl, symptoms last for 3hrs. Need epipen rx renewed  Personal history of asthma use of albuterol prn Trigger: tobacco use, strong smells, humid weather, and acute viral URI. Previous use of flovent.  Food allergy Onset in 2016, worsening Reaction with eggs, almond, and gluten.(cough, chest tightness and chest wall tenderness). She maintains a pescaterian She has already eliminated these items from her diet. She also avoids eating out of her home. She  grows her own vegetable She is also lactose intolerate but able to tolerate cottage cheese Without use of bendaryl, symptoms last for 3hrs. Need epipen rx renewed  Entered referral to allergist Provided epipen rx Start zyrtec 10mg  and pepcid 20mg  daily Advised to continue keeping food log while waiting for appt.   Reviewed past Medical, Social and Family history today.  Outpatient Medications Prior to Visit  Medication Sig Dispense Refill   albuterol (PROAIR HFA) 108 (90 Base) MCG/ACT inhaler Inhale 2 puffs into the lungs every 6 (six) hours as needed for wheezing or shortness of breath. 1 each 0   Ascorbic Acid (VITAMIN C) 100 MG CHEW Vitamin C     B Complex Vitamins (VITAMIN B COMPLEX) TABS Take by mouth.     BLACK COHOSH-DONG QUAI PO Take by mouth 2 times daily at 12 noon and 4 pm.     ferrous gluconate (FERGON) 324 MG tablet Take 324 mg by mouth daily with breakfast.     MULTIPLE VITAMINS-MINERALS ER PO Take by mouth.     EPINEPHrine 0.3 mg/0.3 mL IJ SOAJ injection epinephrine 0.3 mg/0.3 mL injection, auto-injector 1 each 2   Cholecalciferol 125 MCG (5000 UT) capsule Take by mouth. (Patient not taking: Reported on 06/07/2021)     cyclobenzaprine (FLEXERIL) 10 MG tablet Take 1 tablet (10 mg total) by mouth at bedtime. As needed. (Patient not taking: Reported on 06/07/2021) 30 tablet 0   vitamin B-12 (CYANOCOBALAMIN) 100 MCG tablet Vitamin B12  taking daily (Patient not taking: Reported on 06/07/2021)     No facility-administered medications prior to visit.    ROS See HPI  Objective:  BP 98/62 (BP Location: Left Arm, Patient Position: Sitting, Cuff Size: Normal)   Pulse 84   Temp Marland Kitchen)  96.6 F (35.9 C) (Temporal)   Ht 5\' 7"  (1.702 m)   Wt 150 lb 3.2 oz (68.1 kg)   SpO2 99%   BMI 23.52 kg/m   Physical Exam  Assessment & Plan:  This visit occurred during the SARS-CoV-2 public health emergency.  Safety protocols were in place, including screening questions prior to the  visit, additional usage of staff PPE, and extensive cleaning of exam room while observing appropriate contact time as indicated for disinfecting solutions.   Wanda Cortez was seen today for establish care.  Diagnoses and all orders for this visit:  Food allergy -     EPINEPHrine 0.3 mg/0.3 mL IJ SOAJ injection; epinephrine 0.3 mg/0.3 mL injection, auto-injector -     Ambulatory referral to Allergy -     famotidine (PEPCID) 20 MG tablet; Take 1 tablet (20 mg total) by mouth daily. -     cetirizine (ZYRTEC) 10 MG tablet; Take 1 tablet (10 mg total) by mouth daily.  Personal history of asthma   Problem List Items Addressed This Visit       Other   Food allergy - Primary    Onset in 2016, worsening Reaction with eggs, almond, and gluten.(cough, chest tightness and chest wall tenderness). She maintains a pescaterian She has already eliminated these items from her diet. She also avoids eating out of her home. She grows her own vegetable She is also lactose intolerate but able to tolerate cottage cheese Without use of bendaryl, symptoms last for 3hrs. Need epipen rx renewed  Entered referral to allergist Provided epipen rx Start zyrtec 10mg  and pepcid 20mg  daily Advised to continue keeping food log while waiting for appt.      Relevant Medications   EPINEPHrine 0.3 mg/0.3 mL IJ SOAJ injection   famotidine (PEPCID) 20 MG tablet   cetirizine (ZYRTEC) 10 MG tablet   Other Relevant Orders   Ambulatory referral to Allergy   Personal history of asthma    use of albuterol prn Trigger: tobacco use, strong smells, humid weather, and acute viral URI. Previous use of flovent.       Follow-up: Return in about 6 months (around 12/05/2021) for CPE (fasting).  Wilfred Lacy, NP

## 2021-06-07 NOTE — Assessment & Plan Note (Signed)
Onset in 2016, worsening Reaction with eggs, almond, and gluten.(cough, chest tightness and chest wall tenderness). She maintains a pescaterian She has already eliminated these items from her diet. She also avoids eating out of her home. She grows her own vegetable She is also lactose intolerate but able to tolerate cottage cheese Without use of bendaryl, symptoms last for 3hrs. Need epipen rx renewed  Entered referral to allergist Provided epipen rx Start zyrtec 10mg  and pepcid 20mg  daily Advised to continue keeping food log while waiting for appt.

## 2021-06-07 NOTE — Patient Instructions (Addendum)
You will be contacted to schedule appt with allergist.  Start famotidine and zrytec as discussed.  Continue to keep a food log.  Sign medical release to get records from [facility].

## 2021-06-07 NOTE — Assessment & Plan Note (Signed)
use of albuterol prn Trigger: tobacco use, strong smells, humid weather, and acute viral URI. Previous use of flovent.

## 2021-07-06 DIAGNOSIS — L650 Telogen effluvium: Secondary | ICD-10-CM | POA: Diagnosis not present

## 2021-07-17 ENCOUNTER — Encounter: Payer: Self-pay | Admitting: Allergy and Immunology

## 2021-07-17 ENCOUNTER — Other Ambulatory Visit: Payer: Self-pay

## 2021-07-17 ENCOUNTER — Ambulatory Visit: Payer: BC Managed Care – PPO | Admitting: Allergy and Immunology

## 2021-07-17 VITALS — BP 122/68 | HR 72 | Temp 97.5°F | Resp 16 | Ht 65.75 in | Wt 152.2 lb

## 2021-07-17 DIAGNOSIS — T63481A Toxic effect of venom of other arthropod, accidental (unintentional), initial encounter: Secondary | ICD-10-CM | POA: Diagnosis not present

## 2021-07-17 DIAGNOSIS — J4540 Moderate persistent asthma, uncomplicated: Secondary | ICD-10-CM | POA: Diagnosis not present

## 2021-07-17 DIAGNOSIS — K219 Gastro-esophageal reflux disease without esophagitis: Secondary | ICD-10-CM

## 2021-07-17 MED ORDER — OMEPRAZOLE MAGNESIUM 20 MG PO TBEC: 40.0000 mg | DELAYED_RELEASE_TABLET | Freq: Every evening | ORAL | 5 refills | Status: DC

## 2021-07-17 MED ORDER — ALBUTEROL SULFATE HFA 108 (90 BASE) MCG/ACT IN AERS: 2.0000 | INHALATION_SPRAY | Freq: Four times a day (QID) | RESPIRATORY_TRACT | 2 refills | Status: DC | PRN

## 2021-07-17 MED ORDER — AIRDUO DIGIHALER 113-14 MCG/ACT IN AEPB: 1.0000 | INHALATION_SPRAY | Freq: Two times a day (BID) | RESPIRATORY_TRACT | 5 refills | Status: DC

## 2021-07-17 MED ORDER — FAMOTIDINE 40 MG PO TABS: 40.0000 mg | ORAL_TABLET | Freq: Every morning | ORAL | 5 refills | Status: DC

## 2021-07-17 NOTE — Progress Notes (Signed)
[facility] [place] [facility] [place] - [place] - [place]   Dear Dr. Lorayne Marek,  Thank you for referring Wanda Cortez to the [facility] on 07/17/2021.   Below is a summation of this patient's evaluation and recommendations.  Thank you for your referral. I will keep you informed about this patient's response to treatment.   If you have any questions please do not hesitate to contact me.   Sincerely,  Jiles Prows, MD Allergy / Immunology [place]   ______________________________________________________________________    NEW PATIENT NOTE  Referring Provider: Flossie Buffy, NP Primary Provider: Flossie Buffy, NP Date of office visit: 07/17/2021    Subjective:   Chief Complaint:  Wanda Cortez (DOB: 06/15/1971) is a 49 y.o. female who presents to the clinic on 07/17/2021 with a chief complaint of Other (Increased respiratory response when Wanda Cortez is eating) and Asthma (Act -20) .     HPI: Wanda Cortez presents to this clinic in evaluation of asthma.  Wanda Cortez appears to have several different respiratory tract issues.  First, Wanda Cortez has a history of developing problems with "bronchitis" which is coughing and feeling as though Wanda Cortez has chest tightness and a overall bad feeling in her chest that appears 1-2 times per year usually following a viral infection.    Second, Wanda Cortez has noticed over the course of the past 6 months Wanda Cortez has developed a different type of respiratory tract problem.  Wanda Cortez has noticed that after eating within 2 to 3 minutes Wanda Cortez gets a change in her voice and a barky cough and this will go on for hours unless Wanda Cortez takes a Benadryl which truncates this issue to less than 20 minutes or so.  Wanda Cortez feels as though there is something in her chest very high up almost like "air going over sandpaper" when Wanda Cortez develops this issue.  Wanda Cortez is not really sure that albuterol helps this  issue.  Third, Wanda Cortez carries the diagnosis of asthma.  This has been a longstanding issue and can sometimes be precipitated by smokes and perfumes exposure.  Wanda Cortez does not exercise on most days but if Wanda Cortez exercises to a significant amount Wanda Cortez does develop problems with chest tightness and shortness of breath.  Cold air gives her chest tightness and shortness of breath.  These issues do appear to respond to albuterol.  Fourth, Wanda Cortez has an issue with developing upper lip and facial swelling if Wanda Cortez is stung by a wasp.  Wanda Cortez does have an EpiPen.  Fifth, Wanda Cortez has a problem with developing upper lip swelling and facial swelling if Wanda Cortez takes NSAIDs.  Wanda Cortez avoids all NSAIDs.  Sixth, Wanda Cortez has had an issue with his reflux that appears to be treated by having the head of her bed elevated.  Wanda Cortez has been told that Wanda Cortez has a hiatal hernia.  Wanda Cortez does drink green tea every day and Wanda Cortez has chocolate 1 time per week.  Past Medical History:  Diagnosis Date   Asthma    Bronchitis    Pleurisy    Recurrent upper respiratory infection (URI)    Urticaria     Past Surgical History:  Procedure Laterality Date   APPENDECTOMY     KNEE SURGERY Right 2018    Allergies as of 07/17/2021       Reactions   Loratadine Palpitations   Meloxicam Anaphylaxis   Wasp Venom Itching, Hives   Lorazepam Nausea And Vomiting  Prednisone Rash        Medication List    ADRENAL STRESS CALM PO Take by mouth daily.   albuterol 108 (90 Base) MCG/ACT inhaler Commonly known as: ProAir HFA Inhale 2 puffs into the lungs every 6 (six) hours as needed for wheezing or shortness of breath.   Benadryl Allergy 25 mg capsule Generic drug: diphenhydrAMINE Take 25 mg by mouth 3 (three) times a week.   BIOTIN PO Take 1,250 mcg by mouth daily.   BLACK COHOSH-DONG QUAI PO Take by mouth 2 times daily at 12 noon and 4 pm.   cetirizine 10 MG tablet Commonly known as: ZYRTEC Take 1 tablet (10 mg total) by mouth daily.   EPINEPHrine  0.3 mg/0.3 mL Soaj injection Commonly known as: EPI-PEN epinephrine 0.3 mg/0.3 mL injection, auto-injector   famotidine 20 MG tablet Commonly known as: PEPCID Take 1 tablet (20 mg total) by mouth daily.   ferrous gluconate 324 MG tablet Commonly known as: FERGON Take 324 mg by mouth daily with breakfast.   L-LYSINE PO Take 1,000 mg by mouth.   MULTIPLE VITAMINS-MINERALS ER PO Take by mouth.   Vitamin B Complex Tabs Take by mouth.   Vitamin C 100 MG Chew Vitamin C    Review of systems negative except as noted in HPI / PMHx or noted below:  Review of Systems  Constitutional: Negative.   HENT: Negative.    Eyes: Negative.   Respiratory: Negative.    Cardiovascular: Negative.   Gastrointestinal: Negative.   Genitourinary: Negative.   Musculoskeletal: Negative.   Skin: Negative.   Neurological: Negative.   Endo/Heme/Allergies: Negative.   Psychiatric/Behavioral: Negative.     Family History  Problem Relation Age of Onset   Asthma Mother    Allergic rhinitis Mother    Breast cancer Mother    Allergic rhinitis Father    Stroke Father    Hypertension Father    Allergic rhinitis Sister    Arthritis Sister    Allergic rhinitis Sister    Heart disease Paternal Grandmother    Heart disease Paternal Grandfather    Allergic rhinitis Niece    Allergic rhinitis Nephew     Social History   Socioeconomic History   Marital status: Single    Spouse name: Not on file   Number of children: Not on file   Years of education: Not on file   Highest education level: Not on file  Occupational History   Not on file  Tobacco Use   Smoking status: Never   Smokeless tobacco: Never  Vaping Use   Vaping Use: Never used  Substance and Sexual Activity   Alcohol use: No   Drug use: No   Sexual activity: Yes    Birth control/protection: None  Other Topics Concern   Not on file  Social History Narrative   Not on file   Environmental and Social history  Lives in a house  with a dry environment, no animals look inside the household, no carpet in the bedroom, plastic on the bed, no plastic on the pillow, and no smoking ongoing with inside the household.  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  Objective:   Vitals:   07/17/21 1434  BP: 122/68  Pulse: 72  Resp: 16  Temp: (!) 97.5 F (36.4 C)  SpO2: 99%   Height: 5' 5.75" (167 cm) Weight: 152 lb 3.2 oz (69 kg)  Physical Exam Constitutional:      Appearance: Wanda Cortez is not diaphoretic.  HENT:     Head: Normocephalic.     Right Ear: Tympanic membrane, ear canal and external ear normal.     Left Ear: Tympanic membrane, ear canal and external ear normal.     Nose: Nose normal. No mucosal edema or rhinorrhea.     Mouth/Throat:     Pharynx: Uvula midline. No oropharyngeal exudate.  Eyes:     Conjunctiva/sclera: Conjunctivae normal.  Neck:     Thyroid: No thyromegaly.     Trachea: Trachea normal. No tracheal tenderness or tracheal deviation.  Cardiovascular:     Rate and Rhythm: Normal rate and regular rhythm.     Heart sounds: Normal heart sounds, S1 normal and S2 normal. No murmur heard. Pulmonary:     Effort: No respiratory distress.     Breath sounds: Normal breath sounds. No stridor. No wheezing or rales.  Lymphadenopathy:     Head:     Right side of head: No tonsillar adenopathy.     Left side of head: No tonsillar adenopathy.     Cervical: No cervical adenopathy.  Skin:    Findings: No erythema or rash.     Nails: There is no clubbing.  Neurological:     Mental Status: Wanda Cortez is alert.    Diagnostics: Allergy skin tests were performed.  Wanda Cortez did not demonstrate any hypersensitivity against a screening panel of aeroallergens or foods.  Spirometry was performed and demonstrated an FEV1 of 1.87 @ 66 % of predicted. FEV1/FVC = 0.56.  Following the administration of nebulized albuterol her FEV1 did not increase.  The patient had an Asthma Control Test with the following results: ACT Total Score:  20.     Assessment and Plan:    1. Not well controlled moderate persistent asthma   2. LPRD (laryngopharyngeal reflux disease)   3. 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  Allergen avoidance measures?  2.  Treat and prevent inflammation:  A. AirDuo 113 -1 inhalations 2 times per day (specialty pharmacy)  3.  Treat and prevent reflux / LPR:  A. Consolidate caffeine exposure B. Omeprazole 40 mg - 1 tablet 1 time per day in AM C. Famotidine 40 mg - 1 tablet 1 time per day in PM  4.  If needed:  A. Albuterol HFA - 2 inhalations every 4-6 hours B. Epi-Pen, Benadryl, MD/ER evaluation for allergic reaction  5.  Blood - venom IgE panel  6. Return to clinic in 4 weeks or earlier if problem  Angellynn does appear to have a irritated and inflamed respiratory tract but it does not appear as though aero allergen exposure is driving this issue.  Wanda Cortez will utilize the combination inhaler as noted above and to address the issue of a reflux insult to her airway we will be treating her with a combination of a proton pump inhibitor and H2 receptor blocker.  We will work through her possible hymenoptera venom hypersensitivity state by checking a hymenoptera venom IgE panel.  I will see her back in this clinic in 4 weeks or earlier if there is a problem.  Jiles Prows, MD Allergy / Immunology [facility]

## 2021-07-17 NOTE — Patient Instructions (Addendum)
°  1.  Allergen avoidance measures?  2.  Treat and prevent inflammation:  A. AirDuo 113 -1 inhalations 2 times per day (specialty pharmacy)  3.  Treat and prevent reflux / LPR:  A. Consolidate caffeine exposure B. Omeprazole 40 mg - 1 tablet 1 time per day in AM C. Famotidine 40 mg - 1 tablet 1 time per day in PM  4.  If needed:  A. Albuterol HFA - 2 inhalations every 4-6 hours B. Epi-Pen, Benadryl, MD/ER evaluation for allergic reaction  5.  Blood - venom IgE panel  6. Return to clinic in 4 weeks or earlier if problem

## 2021-07-18 ENCOUNTER — Encounter: Payer: Self-pay | Admitting: Allergy and Immunology

## 2021-07-19 ENCOUNTER — Other Ambulatory Visit: Payer: Self-pay | Admitting: *Deleted

## 2021-07-21 LAB — HYMENOPTERA VENOM ALLERGY PROF
I001-IgE Honeybee: <0.1 kU/L
I003-IgE Yellow Jacket: 0.17 kU/L — AB
I004-IgE Paper Wasp: 0.15 kU/L — AB
I208-IgE Api m 1: <0.1 kU/L
I209-IgE Ves v 5: <0.1 kU/L
I210-IgE Pol d 5: <0.1 kU/L
I211-IgE Ves v 1: 0.18 kU/L — AB
I214-IgE Api m 2: <0.1 kU/L
I215-IgE Api m 3: <0.1 kU/L
I216-IgE Api m 5: <0.1 kU/L
I217-IgE Api m 10: <0.1 kU/L
Tryptase: 4.2 ug/L (ref 2.2–13.2)

## 2021-07-21 LAB — O214-IGE CCD DETERMINANTS: O214-IgE CCD Determinants: <0.1 kU/L

## 2021-07-21 LAB — ALLERGEN COMPONENT COMMENTS

## 2021-07-24 ENCOUNTER — Telehealth: Payer: Self-pay | Admitting: Allergy and Immunology

## 2021-07-24 ENCOUNTER — Other Ambulatory Visit: Payer: Self-pay

## 2021-07-24 DIAGNOSIS — J45909 Unspecified asthma, uncomplicated: Secondary | ICD-10-CM | POA: Diagnosis not present

## 2021-07-24 DIAGNOSIS — J45998 Other asthma: Secondary | ICD-10-CM | POA: Diagnosis not present

## 2021-07-24 MED ORDER — ALBUTEROL SULFATE (2.5 MG/3ML) 0.083% IN NEBU: 2.5000 mg | INHALATION_SOLUTION | RESPIRATORY_TRACT | 1 refills | Status: DC | PRN

## 2021-07-24 NOTE — Telephone Encounter (Signed)
Dr Neldon Mc please advise to nebulizer machine for pt?  Lm for pt to call us back about lab results

## 2021-07-24 NOTE — Telephone Encounter (Signed)
Patient was returning a call about lab results and also she had requested a nebulizer machine last week and has not heard anything about it. [phone].

## 2021-07-24 NOTE — Telephone Encounter (Signed)
Pt will come by the [facility] office today to pick up a nebulizer as she works right down the road from these office

## 2021-08-01 ENCOUNTER — Ambulatory Visit
Admission: EM | Admit: 2021-08-01 | Discharge: 2021-08-01 | Disposition: A | Discharge status: Home / Self Care | Payer: BC Managed Care – PPO | Attending: Emergency Medicine | Admitting: Emergency Medicine

## 2021-08-01 ENCOUNTER — Telehealth: Payer: Self-pay | Admitting: Nurse Practitioner

## 2021-08-01 ENCOUNTER — Telehealth: Payer: Self-pay | Admitting: Allergy and Immunology

## 2021-08-01 DIAGNOSIS — R0989 Other specified symptoms and signs involving the circulatory and respiratory systems: Secondary | ICD-10-CM

## 2021-08-01 DIAGNOSIS — K219 Gastro-esophageal reflux disease without esophagitis: Secondary | ICD-10-CM

## 2021-08-01 MED ORDER — LANSOPRAZOLE 30 MG PO CPDR: 30.0000 mg | DELAYED_RELEASE_CAPSULE | Freq: Every day | ORAL | 2 refills | Status: DC

## 2021-08-01 NOTE — Telephone Encounter (Signed)
Patient called believing she is having a reaction from her famotadine. Patient has been taking it every night as instructed by Dr. Neldon Mc. Patient noticed on the 2nd day that it is harder to swallow, patient has noticed it getting worse and worse with each day. Patient also noticed a change in her voice - as if it is impacting her vocal cords.   Patient would like a call back, [phone]

## 2021-08-01 NOTE — Telephone Encounter (Signed)
Please have Wanda Cortez stop her famotidine and lets see what happens over the course of the next several days.

## 2021-08-01 NOTE — ED Triage Notes (Signed)
Pt reports feeling pressure in her throat, patient denies SOB. Patient has a patent airway.

## 2021-08-01 NOTE — Discharge Instructions (Addendum)
Please discontinue omeprazole and famotidine at this time.  Begin Prevacid 30 mg once daily before meal, I recommend taking this prior to your evening meal as the evenings are when your symptoms are worse.  I have enclosed information regarding food choices for gastroesophageal reflux disease as well as information about globus pharyngeus.  Please continue regular follow-up with your asthma provider.  Thank you for visiting urgent care today.

## 2021-08-01 NOTE — Telephone Encounter (Signed)
Called patient and let her know to stop famotidine and see how she does for the next couple of days. Asked patient to notate how she is feeling and to call us back and let us know how it went.

## 2021-08-01 NOTE — Telephone Encounter (Signed)
Pt called stating the famotidine (PEPCID) 40 MG tablet she is taking may be causing her to have trouble swallowing and is also changing the tone of her voice. Baldo Ash did not prescribe this to her, but she is concerned.

## 2021-08-01 NOTE — ED Provider Notes (Signed)
[facility]    CSN: [id number] Arrival date & time: 08/01/21  1312    HISTORY  No chief complaint on file.  HPI Wanda Cortez is a 51 y.o. female. Patient states she was recently prescribed famotidine, is taking it for 2 days and noticed today that she is having throat swelling and pressure.   Past Medical History:  Diagnosis Date   Asthma    Bronchitis    Pleurisy    Recurrent upper respiratory infection (URI)    Urticaria    Patient Active Problem List   Diagnosis Date Noted   Personal history of asthma 06/07/2021   Food allergy 06/07/2021   Acute meniscal tear, lateral, right, initial encounter 05/24/2021   Shoulder effusion, right 03/21/2021   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/21/2021   Biceps tendinitis of right upper extremity 03/16/2021   Cervical radiculopathy at C6 03/16/2021   Menopausal flushing 07/06/2020   Lipoma of left lower extremity 01/04/2020   Cervical cancer ([place]) 07/12/2019   Polyp of cervix 07/12/2019   Tear of right scapholunate ligament 03/30/2019   Essential hypertension 02/25/2019   Chronic pain of right knee 09/22/2018   Acute lateral meniscus tear of left knee 06/10/2017   Submucous leiomyoma of uterus 03/21/2016   Uterine leiomyoma 03/21/2016   Allergy to insect stings 12/27/2014   Past Surgical History:  Procedure Laterality Date   APPENDECTOMY     KNEE SURGERY Right 2018   OB History   No obstetric history on file.    Home Medications    Prior to Admission medications   Medication Sig Start Date End Date Taking? Authorizing Provider  albuterol (PROVENTIL) (2.5 MG/3ML) 0.083% nebulizer solution Take 3 mLs (2.5 mg total) by nebulization every 4 (four) hours as needed for wheezing or shortness of breath. 07/24/21   Kozlow, Donnamarie Poag, MD  albuterol (VENTOLIN HFA) 108 (90 Base) MCG/ACT inhaler Inhale 2 puffs into the lungs every 6 (six) hours as needed for wheezing or shortness of breath. 07/17/21   Kozlow, Donnamarie Poag, MD   Ascorbic Acid (VITAMIN C) 100 MG CHEW Vitamin C    [provider]  B Complex Vitamins (VITAMIN B COMPLEX) TABS Take by mouth.    [provider]  BIOTIN PO Take 1,250 mcg by mouth daily.    [provider]  BLACK COHOSH-DONG QUAI PO Take by mouth 2 times daily at 12 noon and 4 pm.    [provider]  diphenhydrAMINE (BENADRYL ALLERGY) 25 mg capsule Take 25 mg by mouth 3 (three) times a week.    [provider]  EPINEPHrine 0.3 mg/0.3 mL IJ SOAJ injection epinephrine 0.3 mg/0.3 mL injection, auto-injector 06/07/21   Nche, Charlene Brooke, NP  famotidine (PEPCID) 40 MG tablet Take 1 tablet (40 mg total) by mouth in the morning. 07/17/21   Kozlow, Donnamarie Poag, MD  Fluticasone-Salmeterol,sensor, (AIRDUO DIGIHALER) [phone] MCG/ACT AEPB Inhale 1 puff into the lungs in the morning and at bedtime. 07/17/21   Kozlow, Donnamarie Poag, MD  L-LYSINE PO Take 1,000 mg by mouth.    [provider]  MULTIPLE VITAMINS-MINERALS ER PO Take by mouth.    [provider]  omeprazole (PRILOSEC OTC) 20 MG tablet Take 2 tablets (40 mg total) by mouth at bedtime. 07/17/21   Kozlow, Donnamarie Poag, MD  Specialty Vitamins Products (ADRENAL STRESS CALM PO) Take by mouth daily.    [provider]    Family History Family History  Problem Relation Age  of Onset   Asthma Mother    Allergic rhinitis Mother    Breast cancer Mother    Allergic rhinitis Father    Stroke Father    Hypertension Father    Allergic rhinitis Sister    Arthritis Sister    Allergic rhinitis Sister    Heart disease Paternal Grandmother    Heart disease Paternal Grandfather    Allergic rhinitis Niece    Allergic rhinitis Nephew    Social History Social History   Tobacco Use   Smoking status: Never   Smokeless tobacco: Never  Vaping Use   Vaping Use: Never used  Substance Use Topics   Alcohol use: No   Drug use: No   Allergies   Loratadine, Meloxicam, Wasp venom, Lorazepam, and  Prednisone  Review of Systems Review of Systems Pertinent findings noted in history of present illness.   Physical Exam Triage Vital Signs ED Triage Vitals  Enc Vitals Group     BP 05/18/21 0827 (!) 147/82     Pulse Rate 05/18/21 0827 72     Resp 05/18/21 0827 18     Temp 05/18/21 0827 98.3 F (36.8 C)     Temp Source 05/18/21 0827 Oral     SpO2 05/18/21 0827 98 %     Weight --      Height --      Head Circumference --      Peak Flow --      Pain Score 05/18/21 0826 5     Pain Loc --      Pain Edu? --      Excl. in [place]? --   No data found.  Updated Vital Signs BP 122/80 (BP Location: Right Arm)    Pulse 78    Temp 99 F (37.2 C) (Oral)    Resp 16    SpO2 98%   Physical Exam Vitals and nursing note reviewed.  Constitutional:      General: She is not in acute distress.    Appearance: Normal appearance. She is not ill-appearing.  HENT:     Head: Normocephalic and atraumatic.     Salivary Glands: Right salivary gland is not diffusely enlarged or tender. Left salivary gland is not diffusely enlarged or tender.     Right Ear: Tympanic membrane, ear canal and external ear normal. No drainage. No middle ear effusion. There is no impacted cerumen. Tympanic membrane is not erythematous or bulging.     Left Ear: Tympanic membrane, ear canal and external ear normal. No drainage.  No middle ear effusion. There is no impacted cerumen. Tympanic membrane is not erythematous or bulging.     Nose: Nose normal. No nasal deformity, septal deviation, mucosal edema, congestion or rhinorrhea.     Right Turbinates: Not enlarged, swollen or pale.     Left Turbinates: Not enlarged, swollen or pale.     Right Sinus: No maxillary sinus tenderness or frontal sinus tenderness.     Left Sinus: No maxillary sinus tenderness or frontal sinus tenderness.     Mouth/Throat:     Lips: Pink. No lesions.     Mouth: Mucous membranes are moist. No oral lesions.     Pharynx: Oropharynx is clear. Uvula midline.  No posterior oropharyngeal erythema or uvula swelling.     Tonsils: No tonsillar exudate. 0 on the right. 0 on the left.  Eyes:     General: Lids are normal.        Right eye: No discharge.  Left eye: No discharge.     Extraocular Movements: Extraocular movements intact.     Conjunctiva/sclera: Conjunctivae normal.     Right eye: Right conjunctiva is not injected.     Left eye: Left conjunctiva is not injected.  Neck:     Trachea: Trachea and phonation normal.  Cardiovascular:     Rate and Rhythm: Normal rate and regular rhythm.     Pulses: Normal pulses.     Heart sounds: Normal heart sounds. No murmur heard.   No friction rub. No gallop.  Pulmonary:     Effort: Pulmonary effort is normal. No accessory muscle usage, prolonged expiration or respiratory distress.     Breath sounds: Normal breath sounds. No stridor, decreased air movement or transmitted upper airway sounds. No decreased breath sounds, wheezing, rhonchi or rales.  Chest:     Chest wall: No tenderness.  Abdominal:     General: Abdomen is flat. Bowel sounds are normal. There is no distension.     Palpations: Abdomen is soft. There is no mass.     Tenderness: There is no abdominal tenderness. There is no right CVA tenderness, left CVA tenderness or guarding.     Hernia: No hernia is present.  Musculoskeletal:        General: Normal range of motion.     Cervical back: Normal range of motion and neck supple. Normal range of motion.  Lymphadenopathy:     Cervical: No cervical adenopathy.  Skin:    General: Skin is warm and dry.     Findings: No erythema or rash.  Neurological:     General: No focal deficit present.     Mental Status: She is alert and oriented to person, place, and time.  Psychiatric:        Mood and Affect: Mood normal.        Behavior: Behavior normal.    Visual Acuity Right Eye Distance:   Left Eye Distance:   Bilateral Distance:    Right Eye Near:   Left Eye Near:    Bilateral  Near:     UC Couse / Diagnostics / Procedures:    EKG  Radiology No results found.  Procedures Procedures (including critical care time)  UC Diagnoses / Final Clinical Impressions(s)   I have reviewed the triage vital signs and the nursing notes.  Pertinent labs & imaging results that were available during my care of the patient were reviewed by me and considered in my medical decision making (see chart for details).    Final diagnoses:  Gastroesophageal reflux disease without esophagitis  Globus sensation   Begin Prevacid 30 mg daily with evening meal.  Stop famotidine, stop taking omeprazole 20 mg, 2 capsules daily as this is not equal to a full prescription dose.  Patient provided with information regarding GERD and globus sensation.  Return precautions advised.  ED Prescriptions     Medication Sig Dispense Auth. Provider   lansoprazole (PREVACID) 30 MG capsule Take 1 capsule (30 mg total) by mouth daily before supper. 30 capsule Lynden Oxford Scales, PA-C      PDMP not reviewed this encounter.  Pending results:  Labs Reviewed - No data to display  Medications Ordered in UC: Medications - No data to display  Disposition Upon Discharge:  Condition: stable for discharge home Home: take medications as prescribed; routine discharge instructions as discussed; follow up as advised.  Patient presented with an acute illness with associated systemic symptoms and significant discomfort requiring urgent management.  In my opinion, this is a condition that a prudent lay person (someone who possesses an average knowledge of health and medicine) may potentially expect to result in complications if not addressed urgently such as respiratory distress, impairment of bodily function or dysfunction of bodily organs.   Routine symptom specific, illness specific and/or disease specific instructions were discussed with the patient and/or caregiver at length.   As such, the patient has  been evaluated and assessed, work-up was performed and treatment was provided in alignment with urgent care protocols and evidence based medicine.  Patient/parent/caregiver has been advised that the patient may require follow up for further testing and treatment if the symptoms continue in spite of treatment, as clinically indicated and appropriate.  If the patient was tested for COVID-19, Influenza and/or RSV, then the patient/parent/guardian was advised to isolate at home pending the results of his/her diagnostic coronavirus test and potentially longer if theyre positive. I have also advised pt that if his/her COVID-19 test returns positive, it's recommended to self-isolate for at least 10 days after symptoms first appeared AND until fever-free for 24 hours without fever reducer AND other symptoms have improved or resolved. Discussed self-isolation recommendations as well as instructions for household member/close contacts as per the [facility] and [place] DHHS, and also gave patient the [place] packet with this information.  Patient/parent/caregiver has been advised to return to the [facility] or PCP in 3-5 days if no better; to PCP or the Emergency Department if new signs and symptoms develop, or if the current signs or symptoms continue to change or worsen for further workup, evaluation and treatment as clinically indicated and appropriate  The patient will follow up with their current PCP if and as advised. If the patient does not currently have a PCP we will assist them in obtaining one.   The patient may need specialty follow up if the symptoms continue, in spite of conservative treatment and management, for further workup, evaluation, consultation and treatment as clinically indicated and appropriate.   Patient/parent/caregiver verbalized understanding and agreement of plan as discussed.  All questions were addressed during visit.  Please see discharge instructions below for further details of plan.  Discharge  Instructions:   Discharge Instructions      Please discontinue omeprazole and famotidine at this time.  Begin Prevacid 30 mg once daily before meal, I recommend taking this prior to your evening meal as the evenings are when your symptoms are worse.  I have enclosed information regarding food choices for gastroesophageal reflux disease as well as information about globus pharyngeus.  Please continue regular follow-up with your asthma provider.  Thank you for visiting urgent care today.      This office note has been dictated using Museum/gallery curator.  Unfortunately, and despite my best efforts, this method of dictation can sometimes lead to occasional typographical or grammatical errors.  I apologize in advance if this occurs.     Lynden Oxford Scales, PA-C 08/01/21 1444

## 2021-08-02 NOTE — Telephone Encounter (Signed)
Please advise 

## 2021-08-02 NOTE — Telephone Encounter (Signed)
LVM on patient personal mailbox instructing her to stop famotidine and return our call if she has any questions.

## 2021-08-28 ENCOUNTER — Ambulatory Visit: Payer: BC Managed Care – PPO | Admitting: Allergy and Immunology

## 2021-08-28 ENCOUNTER — Other Ambulatory Visit: Payer: Self-pay

## 2021-08-28 VITALS — BP 112/70 | HR 78 | Temp 98.0°F | Resp 16 | Ht 67.0 in | Wt 151.0 lb

## 2021-08-28 DIAGNOSIS — J4530 Mild persistent asthma, uncomplicated: Secondary | ICD-10-CM

## 2021-08-28 DIAGNOSIS — T63481A Toxic effect of venom of other arthropod, accidental (unintentional), initial encounter: Secondary | ICD-10-CM

## 2021-08-28 DIAGNOSIS — K219 Gastro-esophageal reflux disease without esophagitis: Secondary | ICD-10-CM

## 2021-08-28 DIAGNOSIS — Z91018 Allergy to other foods: Secondary | ICD-10-CM

## 2021-08-28 MED ORDER — EPINEPHRINE 0.3 MG/0.3ML IJ SOAJ: INTRAMUSCULAR | 2 refills | Status: DC

## 2021-08-28 MED ORDER — OMEPRAZOLE 40 MG PO CPDR: 40.0000 mg | DELAYED_RELEASE_CAPSULE | Freq: Two times a day (BID) | ORAL | 5 refills | Status: DC

## 2021-08-28 MED ORDER — ALBUTEROL SULFATE HFA 108 (90 BASE) MCG/ACT IN AERS: 2.0000 | INHALATION_SPRAY | Freq: Four times a day (QID) | RESPIRATORY_TRACT | 2 refills | Status: DC | PRN

## 2021-08-28 MED ORDER — ALBUTEROL SULFATE (2.5 MG/3ML) 0.083% IN NEBU: 2.5000 mg | INHALATION_SOLUTION | RESPIRATORY_TRACT | 1 refills | Status: DC | PRN

## 2021-08-28 NOTE — Progress Notes (Signed)
[place] [facility] [place] [facility] [place]   Follow-up Note  Referring Provider: Nche, Charlene Brooke, NP Primary Provider: Flossie Buffy, NP Date of Office Visit: 08/28/2021  Subjective:   Wanda Cortez (DOB: 1971/03/26) is a 51 y.o. female who returns to the [facility] [place] on 08/28/2021 in re-evaluation of the following:  HPI: Wanda Cortez returns to this clinic in evaluation of asthma, reflux induced respiratory disease, and hymenoptera venom hypersensitivity state.  Her last visit to this clinic was her initial evaluation of 17 July 2021.  She has noticed a significant improvement regarding her respiratory tract while focusing on the treatment of reflux.  She does not have any coughing and she does not have the sensation that there is something very high up in her chest and feeling as though there is "area going over sandpaper" and she can eat really well without developing a coughing spell at this point in time.  She is using omeprazole 1 time per day.  She was intolerant of using famotidine in the evening because of some throat issues.  She notes that if she is very careful about eating before 5:30 PM.  If she eats past 5:30 PM she does have some reflux activity.  She has no need to use albuterol.  She has not been using her combination inhaler.  She continues with an EpiPen regarding her hymenoptera venom hypersensitivity state.  Allergies as of 08/28/2021       Reactions   Loratadine Palpitations   Meloxicam Anaphylaxis   Wasp Venom Itching, Hives   Lorazepam Nausea And Vomiting   Prednisone Rash        Medication List    ADRENAL STRESS CALM PO Take by mouth daily.   AirDuo Digihaler 113-14 MCG/ACT Aepb Generic drug: Fluticasone-Salmeterol(sensor) Inhale 1 puff into the lungs in the morning and at bedtime.   albuterol 108 (90 Base) MCG/ACT inhaler Commonly known as: VENTOLIN HFA Inhale 2 puffs into the lungs every 6 (six) hours as  needed for wheezing or shortness of breath.   albuterol (2.5 MG/3ML) 0.083% nebulizer solution Commonly known as: PROVENTIL Take 3 mLs (2.5 mg total) by nebulization every 4 (four) hours as needed for wheezing or shortness of breath.   Benadryl Allergy 25 mg capsule Generic drug: diphenhydrAMINE Take 25 mg by mouth 3 (three) times a week.   BIOTIN PO Take 1,250 mcg by mouth daily.   BLACK COHOSH-DONG QUAI PO Take by mouth 2 times daily at 12 noon and 4 pm.   EPINEPHrine 0.3 mg/0.3 mL Soaj injection Commonly known as: EPI-PEN epinephrine 0.3 mg/0.3 mL injection, auto-injector   L-LYSINE PO Take 1,000 mg by mouth.   lansoprazole 30 MG capsule Commonly known as: Prevacid Take 1 capsule (30 mg total) by mouth daily before supper.   MULTIPLE VITAMINS-MINERALS ER PO Take by mouth.   Vitamin B Complex Tabs Take by mouth.   Vitamin C 100 MG Chew Vitamin C    Past Medical History:  Diagnosis Date   Asthma    Bronchitis    Pleurisy    Recurrent upper respiratory infection (URI)    Urticaria     Past Surgical History:  Procedure Laterality Date   APPENDECTOMY     KNEE SURGERY Right 2018    Review of systems negative except as noted in HPI / PMHx or noted below:  Review of Systems  Constitutional: Negative.   HENT: Negative.    Eyes: Negative.   Respiratory:  Negative.    Cardiovascular: Negative.   Gastrointestinal: Negative.   Genitourinary: Negative.   Musculoskeletal: Negative.   Skin: Negative.   Neurological: Negative.   Endo/Heme/Allergies: Negative.   Psychiatric/Behavioral: Negative.      Objective:   Vitals:   08/28/21 0937  BP: 112/70  Pulse: 78  Resp: 16  Temp: 98 F (36.7 C)  SpO2: 100%   Height: 5\' 7"  (170.2 cm)  Weight: 151 lb (68.5 kg)   Physical Exam Constitutional:      Appearance: She is not diaphoretic.  HENT:     Head: Normocephalic.     Right Ear: Tympanic membrane, ear canal and external ear normal.     Left Ear:  Tympanic membrane, ear canal and external ear normal.     Nose: Nose normal. No mucosal edema or rhinorrhea.     Mouth/Throat:     Pharynx: Uvula midline. No oropharyngeal exudate.  Eyes:     Conjunctiva/sclera: Conjunctivae normal.  Neck:     Thyroid: No thyromegaly.     Trachea: Trachea normal. No tracheal tenderness or tracheal deviation.  Cardiovascular:     Rate and Rhythm: Normal rate and regular rhythm.     Heart sounds: Normal heart sounds, S1 normal and S2 normal. No murmur heard. Pulmonary:     Effort: No respiratory distress.     Breath sounds: Normal breath sounds. No stridor. No wheezing or rales.  Lymphadenopathy:     Head:     Right side of head: No tonsillar adenopathy.     Left side of head: No tonsillar adenopathy.     Cervical: No cervical adenopathy.  Skin:    Findings: No erythema or rash.     Nails: There is no clubbing.  Neurological:     Mental Status: She is alert.    Diagnostics:    Spirometry was performed and demonstrated an FEV1 of 2.96 at 96 % of predicted.   Results of blood tests obtained 17 July 2021 identifies IgE antibodies directed against yellowjacket at 0.17 KU/L, Ves V1 0.18 KU/L, WASP 0.15 KU/L.  Assessment and Plan:   1. Asthma, well controlled, mild persistent   2. LPRD (laryngopharyngeal reflux disease)   3. 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 Food allergy     1.  Allergen avoidance measures - flying insects  2.  Continue to treat and prevent reflux / LPR:  A. Consolidate caffeine exposure B. INCREASE Omeprazole 40 mg - 1 tablet 2 times per day  C. Nissen fundoplication???  3.  If needed:  A. Albuterol HFA - 2 inhalations every 4-6 hours B. Epi-Pen, Benadryl, MD/ER evaluation for allergic reaction  4.  Consider starting a course of immunotherapy against yellowjacket and wasp  5. Return to clinic in 12 weeks or earlier if problem  Wanda Cortez is doing much better now that her reflux is under little better  control.  She still has some symptoms at nighttime and she will increase her omeprazole to twice a day.  She is intolerant of using famotidine.  I also gave her the name of a Nissen fundoplication that she can explore as a possible option for her reflux if she does not gain good control of this issue on this plan noted above.  We have given her some literature about starting immunotherapy again yellowjacket and wasp.  She will inform us if she would like to start this form of treatment.  I will see her back in this clinic in 12 weeks  or earlier if there is a problem.  Allena Katz, MD Allergy / Immunology [place]

## 2021-08-28 NOTE — Patient Instructions (Signed)
°  1.  Allergen avoidance measures - flying insects  2.  Continue to treat and prevent reflux / LPR:  A. Consolidate caffeine exposure B. INCREASE Omeprazole 40 mg - 1 tablet 2 times per day  C. Nissen fundoplication???  3.  If needed:  A. Albuterol HFA - 2 inhalations every 4-6 hours B. Epi-Pen, Benadryl, MD/ER evaluation for allergic reaction  4.  Consider starting a course of immunotherapy against yellowjacket and wasp  5. Return to clinic in 12 weeks or earlier if problem

## 2021-08-29 ENCOUNTER — Encounter: Payer: Self-pay | Admitting: Allergy and Immunology

## 2021-09-05 ENCOUNTER — Encounter: Payer: Self-pay | Admitting: Nurse Practitioner

## 2021-09-05 ENCOUNTER — Ambulatory Visit: Payer: BC Managed Care – PPO | Admitting: Nurse Practitioner

## 2021-09-05 ENCOUNTER — Other Ambulatory Visit: Payer: Self-pay

## 2021-09-05 VITALS — BP 100/64 | HR 84 | Temp 96.8°F | Ht 66.0 in | Wt 151.0 lb

## 2021-09-05 DIAGNOSIS — Z1211 Encounter for screening for malignant neoplasm of colon: Secondary | ICD-10-CM | POA: Diagnosis not present

## 2021-09-05 DIAGNOSIS — K148 Other diseases of tongue: Secondary | ICD-10-CM

## 2021-09-05 NOTE — Patient Instructions (Signed)
You will be contacted to schedule appt with oral surgeon  I do not have records from [facility].

## 2021-09-05 NOTE — Progress Notes (Signed)
Subjective:  Patient ID: Wanda Cortez, female    DOB: June 14, 1971  Age: 51 y.o. MRN: [id number]  CC: Acute Visit (Pt c/o bumps in mouth, pt states they appear to be on the back of the tongue and thinks they have been there for a long time. )  Mouth Lesions  The current episode started more than 1 week ago (lesion on right side of tongue). The onset was gradual. The problem has been gradually worsening. Nothing relieves the symptoms. Nothing aggravates the symptoms. Associated symptoms include mouth sores. Pertinent negatives include no fever, no sore throat, no stridor, no swollen glands and no neck pain. She has been Behaving normally. She has been Eating and drinking normally. Urine output has been normal. The last void occurred Less than 6 hours ago. There were no sick contacts. Recent Medical Care: oal exam by dentist 1week ago.  No hx of tobacco use, no ETOH use  Reviewed past Medical, Social and Family history today.  Outpatient Medications Prior to Visit  Medication Sig Dispense Refill   albuterol (PROVENTIL) (2.5 MG/3ML) 0.083% nebulizer solution Take 3 mLs (2.5 mg total) by nebulization every 4 (four) hours as needed for wheezing or shortness of breath. 75 mL 1   albuterol (VENTOLIN HFA) 108 (90 Base) MCG/ACT inhaler Inhale 2 puffs into the lungs every 6 (six) hours as needed for wheezing or shortness of breath. 8 g 2   Ascorbic Acid (VITAMIN C) 100 MG CHEW Vitamin C     B Complex Vitamins (VITAMIN B COMPLEX) TABS Take by mouth.     BIOTIN PO Take 1,250 mcg by mouth daily.     BLACK COHOSH-DONG QUAI PO Take by mouth 2 times daily at 12 noon and 4 pm.     diphenhydrAMINE (BENADRYL) 25 mg capsule Take 25 mg by mouth 3 (three) times a week.     EPINEPHrine 0.3 mg/0.3 mL IJ SOAJ injection epinephrine 0.3 mg/0.3 mL injection, auto-injector 1 each 2   MULTIPLE VITAMINS-MINERALS ER PO Take by mouth.     omeprazole (PRILOSEC) 40 MG capsule Take 1 capsule (40 mg total) by mouth in the morning  and at bedtime. 60 capsule 5   Specialty Vitamins Products (ADRENAL STRESS CALM PO) Take by mouth daily.     No facility-administered medications prior to visit.   ROS See HPI  Objective:  BP 100/64 (BP Location: Left Arm, Patient Position: Sitting, Cuff Size: Normal)    Pulse 84    Temp (!) 96.8 F (36 C) (Temporal)    Ht 5\' 6"  (1.676 m)    Wt 151 lb (68.5 kg)    SpO2 100%    BMI 24.37 kg/m   Physical Exam HENT:     Mouth/Throat:     Mouth: Mucous membranes are moist.     Dentition: Normal dentition.     Tongue: Lesions present. Tongue does not deviate from midline.     Pharynx: Oropharynx is clear.     Tonsils: No tonsillar exudate.      Comments: 2cystic lesions on lateral aspect of tongue no erythema, no pain. Neck:     Thyroid: No thyroid mass, thyromegaly or thyroid tenderness.  Musculoskeletal:     Cervical back: Normal range of motion.  Lymphadenopathy:     Cervical: No cervical adenopathy.   Assessment & Plan:  This visit occurred during the SARS-CoV-2 public health emergency.  Safety protocols were in place, including screening questions prior to the visit, additional usage of staff PPE,  and extensive cleaning of exam room while observing appropriate contact time as indicated for disinfecting solutions.   Wanda Cortez was seen today for acute visit.  Diagnoses and all orders for this visit:  Tongue lesion -     Ambulatory referral to Oral Maxillofacial Surgery  Colon cancer screening -     Cologuard; Future  Lesions appear benign and she has no risk factors for oral cancer. Will refer to oral surgeon to confirm.  Problem List Items Addressed This Visit   None Visit Diagnoses     Tongue lesion    -  Primary   Relevant Orders   Ambulatory referral to Oral Maxillofacial Surgery   Colon cancer screening       Relevant Orders   Cologuard       Follow-up: Return if symptoms worsen or fail to improve.  Wanda Lacy, NP

## 2021-09-10 ENCOUNTER — Encounter: Payer: Self-pay | Admitting: Nurse Practitioner

## 2021-09-11 ENCOUNTER — Ambulatory Visit: Payer: BC Managed Care – PPO | Admitting: Family Medicine

## 2021-09-11 ENCOUNTER — Other Ambulatory Visit: Payer: Self-pay

## 2021-09-11 VITALS — BP 118/76 | HR 82 | Temp 97.5°F | Ht 66.0 in | Wt 150.0 lb

## 2021-09-11 DIAGNOSIS — R197 Diarrhea, unspecified: Secondary | ICD-10-CM | POA: Diagnosis not present

## 2021-09-11 DIAGNOSIS — R1031 Right lower quadrant pain: Secondary | ICD-10-CM

## 2021-09-11 NOTE — Progress Notes (Signed)
[facility] [address] [place] [address] Dept: [phone] Dept Fax: [phone]  Office Visit  Subjective:    Patient ID: Wanda Cortez, female    DOB: Oct 31, 1970, 51 y.o..   MRN: [id number]  Chief Complaint  Patient presents with   Acute Visit    C/o having 4-9 x a day of diarhea x 9 days.  She has been taking Imodium.      History of Present Illness:  Patient is in today for with a 9-day history of diarrhea. She notes she is having watery diarrhea about 5 times a day. There has been no blood in her stool. She is not febrile. She has no nausea/vomiting. She is eating well and taking fluids. She is supplementing her usual fluid intake with two Biosteel Hydration Mix drinks a day. She has had no recent antibiotics or travel history. She took some Imodium which helps temporarily. Ms. Niblack has had a prior appendectomy.   Past Medical History: Patient Active Problem List   Diagnosis Date Noted   Personal history of asthma 06/07/2021   Food allergy 06/07/2021   Shoulder effusion, right 03/21/2021   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/21/2021   Cervical radiculopathy at C6 03/16/2021   Menopausal flushing 07/06/2020   Lipoma of left lower extremity 01/04/2020   Cervical cancer ([place]) 07/12/2019   Polyp of cervix 07/12/2019   Tear of right scapholunate ligament 03/30/2019   Essential hypertension 02/25/2019   Chronic pain of right knee 09/22/2018   Acute lateral meniscus tear of left knee 06/10/2017   Submucous leiomyoma of uterus 03/21/2016   Uterine leiomyoma 03/21/2016   Allergy to insect stings 12/27/2014   Past Surgical History:  Procedure Laterality Date   APPENDECTOMY     KNEE SURGERY Right 2018   Family History  Problem Relation Age of Onset   Asthma Mother    Allergic rhinitis Mother    Breast cancer Mother    Allergic rhinitis Father    Stroke Father    Hypertension Father    Allergic rhinitis  Sister    Arthritis Sister    Allergic rhinitis Sister    Heart disease Paternal Grandmother    Heart disease Paternal Grandfather    Allergic rhinitis Niece    Allergic rhinitis Nephew    Outpatient Medications Prior to Visit  Medication Sig Dispense Refill   albuterol (PROVENTIL) (2.5 MG/3ML) 0.083% nebulizer solution Take 3 mLs (2.5 mg total) by nebulization every 4 (four) hours as needed for wheezing or shortness of breath. 75 mL 1   albuterol (VENTOLIN HFA) 108 (90 Base) MCG/ACT inhaler Inhale 2 puffs into the lungs every 6 (six) hours as needed for wheezing or shortness of breath. 8 g 2   Ascorbic Acid (VITAMIN C) 100 MG CHEW Vitamin C     B Complex Vitamins (VITAMIN B COMPLEX) TABS Take by mouth.     BIOTIN PO Take 1,250 mcg by mouth daily.     BLACK COHOSH-DONG QUAI PO Take by mouth 2 times daily at 12 noon and 4 pm.     diphenhydrAMINE (BENADRYL) 25 mg capsule Take 25 mg by mouth 3 (three) times a week.     EPINEPHrine 0.3 mg/0.3 mL IJ SOAJ injection epinephrine 0.3 mg/0.3 mL injection, auto-injector 1 each 2   MULTIPLE VITAMINS-MINERALS ER PO Take by mouth.     omeprazole (PRILOSEC) 40 MG capsule Take 1 capsule (40 mg total) by mouth in the morning and  at bedtime. 60 capsule 5   Specialty Vitamins Products (ADRENAL STRESS CALM PO) Take by mouth daily.     No facility-administered medications prior to visit.   Allergies  Allergen Reactions   Loratadine Palpitations   Meloxicam Anaphylaxis   Wasp Venom Itching and Hives   Lorazepam Nausea And Vomiting   Prednisone Rash     Objective:   Today's Vitals   09/11/21 1434  BP: 118/76  Pulse: 82  Temp: (!) 97.5 F (36.4 C)  TempSrc: Temporal  SpO2: 98%  Weight: 150 lb (68 kg)  Height: 5\' 6"  (1.676 m)   Body mass index is 24.21 kg/m.   General: Well developed, well nourished. No acute distress. Abdomen: Soft. Bowel sounds positive, normal pitch, mildly increased frequency. Pain in the right mid to   lower quadrant. -  Murphy's sign. Mild guarding with deep palpation. No rebound. Psych: Alert and oriented. Normal mood and affect.  Health Maintenance Due  Topic Date Due   HIV Screening  Never done   Hepatitis C Screening  Never done   TETANUS/TDAP  Never done   Zoster Vaccines- Shingrix (1 of 2) Never done   COLONOSCOPY (Pts 45-52yrs Insurance coverage will need to be confirmed)  Never done     Assessment & Plan:   1. Diarrhea, unspecified type Ms. Heacox is having acute diarrhea going on for 9 days now. I will check screening labs and stool cultures. I will hold off on empiric antibiotics at this point. She can continue some limited Imodium use.  - Comprehensive metabolic panel - CBC with Differential/Platelet - Stool Culture  2. Right lower quadrant abdominal pain In addition to the diarrhea. Ms. Munsch has right mid- and lower quadrant abdominal pain. She has had a prior appendectomy. I will check screening labs and order a CT of the abdomen to assess. I will plan to follow up with her after her scan is available.  - Comprehensive metabolic panel - CBC with Differential/Platelet - Lipase - CT ABDOMEN PELVIS W CONTRAST; Future - Urinalysis w microscopic + reflex cultur  Haydee Salter, MD

## 2021-09-11 NOTE — Patient Instructions (Signed)
Diarrhea, Adult ?Diarrhea is frequent loose and watery bowel movements. Diarrhea can make you feel weak and cause you to become dehydrated. Dehydration can make you tired and thirsty, cause you to have a dry mouth, and decrease how often you urinate. ?Diarrhea typically lasts 2-3 days. However, it can last longer if it is a sign of something more serious. It is important to treat your diarrhea as told by your health care provider. ?Follow these instructions at home: ?Eating and drinking ?  ?Follow these recommendations as told by your health care provider: ?Take an oral rehydration solution (ORS). This is an over-the-counter medicine that helps return your body to its normal balance of nutrients and water. It is found at pharmacies and retail stores. ?Drink plenty of fluids, such as water, ice chips, diluted fruit juice, and low-calorie sports drinks. You can drink milk also, if desired. ?Avoid drinking fluids that contain a lot of sugar or caffeine, such as energy drinks, sports drinks, and soda. ?Eat bland, easy-to-digest foods in small amounts as you are able. These foods include bananas, applesauce, rice, lean meats, toast, and crackers. ?Avoid alcohol. ?Avoid spicy or fatty foods. ? ?Medicines ?Take over-the-counter and prescription medicines only as told by your health care provider. ?If you were prescribed an antibiotic medicine, take it as told by your health care provider. Do not stop using the antibiotic even if you start to feel better. ?General instructions ? ?Wash your hands often using soap and water. If soap and water are not available, use a hand sanitizer. Others in the household should wash their hands as well. Hands should be washed: ?After using the toilet or changing a diaper. ?Before preparing, cooking, or serving food. ?While caring for a sick person or while visiting someone in a hospital. ?Drink enough fluid to keep your urine pale yellow. ?Rest at home while you recover. ?Watch your  condition for any changes. ?Take a warm bath to relieve any burning or pain from frequent diarrhea episodes. ?Keep all follow-up visits as told by your health care provider. This is important. ?Contact a health care provider if: ?You have a fever. ?Your diarrhea gets worse. ?You have new symptoms. ?You cannot keep fluids down. ?You feel light-headed or dizzy. ?You have a headache. ?You have muscle cramps. ?Get help right away if: ?You have chest pain. ?You feel extremely weak or you faint. ?You have bloody or black stools or stools that look like tar. ?You have severe pain, cramping, or bloating in your abdomen. ?You have trouble breathing or you are breathing very quickly. ?Your heart is beating very quickly. ?Your skin feels cold and clammy. ?You feel confused. ?You have signs of dehydration, such as: ?Dark urine, very little urine, or no urine. ?Cracked lips. ?Dry mouth. ?Sunken eyes. ?Sleepiness. ?Weakness. ?Summary ?Diarrhea is frequent loose and sometimes watery bowel movements. Diarrhea can make you feel weak and cause you to become dehydrated. ?Drink enough fluids to keep your urine pale yellow. ?Make sure that you wash your hands after using the toilet. If soap and water are not available, use hand sanitizer. ?Contact a health care provider if your diarrhea gets worse or you have new symptoms. ?Get help right away if you have signs of dehydration. ?This information is not intended to replace advice given to you by your health care provider. Make sure you discuss any questions you have with your health care provider. ?Document Revised: 01/17/2021 Document Reviewed: 01/17/2021 ?Elsevier Patient Education ? 2022 [employer] ? ?

## 2021-09-12 ENCOUNTER — Other Ambulatory Visit: Payer: BC Managed Care – PPO

## 2021-09-12 DIAGNOSIS — R197 Diarrhea, unspecified: Secondary | ICD-10-CM | POA: Diagnosis not present

## 2021-09-12 LAB — CBC WITH DIFFERENTIAL/PLATELET
Basophils Absolute: 0 10*3/uL (ref 0.0–0.1)
Basophils Relative: 0.7 % (ref 0.0–3.0)
Eosinophils Absolute: 0.1 10*3/uL (ref 0.0–0.7)
Eosinophils Relative: 1.2 % (ref 0.0–5.0)
HCT: 40.6 % (ref 36.0–46.0)
Hemoglobin: 13.6 g/dL (ref 12.0–15.0)
Lymphocytes Relative: 29.3 % (ref 12.0–46.0)
Lymphs Abs: 1.6 10*3/uL (ref 0.7–4.0)
MCHC: 33.6 g/dL (ref 30.0–36.0)
MCV: 93.2 fl (ref 78.0–100.0)
Monocytes Absolute: 0.5 10*3/uL (ref 0.1–1.0)
Monocytes Relative: 8.3 % (ref 3.0–12.0)
Neutro Abs: 3.3 10*3/uL (ref 1.4–7.7)
Neutrophils Relative %: 60.5 % (ref 43.0–77.0)
Platelets: 315 10*3/uL (ref 150.0–400.0)
RBC: 4.35 Mil/uL (ref 3.87–5.11)
RDW: 12.4 % (ref 11.5–15.5)
WBC: 5.5 10*3/uL (ref 4.0–10.5)

## 2021-09-12 LAB — COMPREHENSIVE METABOLIC PANEL
ALT: 25 U/L (ref 0–35)
AST: 21 U/L (ref 0–37)
Albumin: 4.7 g/dL (ref 3.5–5.2)
Alkaline Phosphatase: 54 U/L (ref 39–117)
BUN: 14 mg/dL (ref 6–23)
CO2: 32 mEq/L (ref 19–32)
Calcium: 9.9 mg/dL (ref 8.4–10.5)
Chloride: 102 mEq/L (ref 96–112)
Creatinine, Ser: 0.73 mg/dL (ref 0.40–1.20)
GFR: 95.41 mL/min (ref >60.00)
Glucose, Bld: 97 mg/dL (ref 70–99)
Potassium: 4.8 mEq/L (ref 3.5–5.1)
Sodium: 138 mEq/L (ref 135–145)
Total Bilirubin: 0.3 mg/dL (ref 0.2–1.2)
Total Protein: 7.5 g/dL (ref 6.0–8.3)

## 2021-09-12 LAB — URINALYSIS W MICROSCOPIC + REFLEX CULTURE
Bacteria, UA: NONE SEEN /HPF
Bilirubin Urine: NEGATIVE
Glucose, UA: NEGATIVE
Hgb urine dipstick: NEGATIVE
Hyaline Cast: NONE SEEN /LPF
Ketones, ur: NEGATIVE
Leukocyte Esterase: NEGATIVE
Nitrites, Initial: NEGATIVE
Protein, ur: NEGATIVE
RBC / HPF: NONE SEEN /HPF (ref 0–2)
Specific Gravity, Urine: 1.006 (ref 1.001–1.035)
Squamous Epithelial / HPF: NONE SEEN /HPF (ref <=5)
WBC, UA: NONE SEEN /HPF (ref 0–5)
pH: 5.5 (ref 5.0–8.0)

## 2021-09-12 LAB — NO CULTURE INDICATED

## 2021-09-12 LAB — LIPASE: Lipase: 41 U/L (ref 11.0–59.0)

## 2021-09-12 NOTE — Progress Notes (Signed)
Per the orders fo Wilfred Lacy NP pt is here for drop off stoll sample for cultre pt has provided an adequate amount of stool as requested.

## 2021-09-17 ENCOUNTER — Encounter: Payer: Self-pay | Admitting: Family Medicine

## 2021-09-17 LAB — STOOL CULTURE: E coli, Shiga toxin Assay: NEGATIVE

## 2021-09-17 NOTE — Telephone Encounter (Signed)
Please review and advise. Thanks. Dm/cma  

## 2021-09-19 ENCOUNTER — Telehealth: Payer: Self-pay | Admitting: Nurse Practitioner

## 2021-09-19 NOTE — Telephone Encounter (Signed)
[facility] are needing the referral faxed back over to them. Fax [phone]. ?

## 2021-09-20 ENCOUNTER — Other Ambulatory Visit: Payer: BC Managed Care – PPO

## 2021-09-20 DIAGNOSIS — H524 Presbyopia: Secondary | ICD-10-CM | POA: Diagnosis not present

## 2021-09-20 DIAGNOSIS — H04123 Dry eye syndrome of bilateral lacrimal glands: Secondary | ICD-10-CM | POA: Diagnosis not present

## 2021-09-20 DIAGNOSIS — H02831 Dermatochalasis of right upper eyelid: Secondary | ICD-10-CM | POA: Diagnosis not present

## 2021-09-20 DIAGNOSIS — H57813 Brow ptosis, bilateral: Secondary | ICD-10-CM | POA: Diagnosis not present

## 2021-09-20 DIAGNOSIS — H5212 Myopia, left eye: Secondary | ICD-10-CM | POA: Diagnosis not present

## 2021-09-20 DIAGNOSIS — H52203 Unspecified astigmatism, bilateral: Secondary | ICD-10-CM | POA: Diagnosis not present

## 2021-09-21 ENCOUNTER — Ambulatory Visit
Admission: RE | Admit: 2021-09-21 | Discharge: 2021-09-21 | Disposition: A | Discharge status: Home / Self Care | Payer: BC Managed Care – PPO | Source: Ambulatory Visit | Attending: Family Medicine | Admitting: Family Medicine

## 2021-09-21 DIAGNOSIS — K3189 Other diseases of stomach and duodenum: Secondary | ICD-10-CM | POA: Diagnosis not present

## 2021-09-21 DIAGNOSIS — R350 Frequency of micturition: Secondary | ICD-10-CM | POA: Diagnosis not present

## 2021-09-21 DIAGNOSIS — Z01419 Encounter for gynecological examination (general) (routine) without abnormal findings: Secondary | ICD-10-CM | POA: Diagnosis not present

## 2021-09-21 DIAGNOSIS — N941 Unspecified dyspareunia: Secondary | ICD-10-CM | POA: Diagnosis not present

## 2021-09-21 DIAGNOSIS — Z124 Encounter for screening for malignant neoplasm of cervix: Secondary | ICD-10-CM | POA: Diagnosis not present

## 2021-09-21 DIAGNOSIS — R1031 Right lower quadrant pain: Secondary | ICD-10-CM

## 2021-09-21 MED ORDER — IOPAMIDOL (ISOVUE-300) INJECTION 61%
100.0000 mL | Freq: Once | INTRAVENOUS | Status: AC | PRN
  Administered 2021-09-21: 100 mL via INTRAVENOUS

## 2021-09-27 ENCOUNTER — Other Ambulatory Visit: Payer: BC Managed Care – PPO

## 2021-10-02 ENCOUNTER — Ambulatory Visit: Payer: BC Managed Care – PPO | Admitting: Family Medicine

## 2021-11-25 ENCOUNTER — Other Ambulatory Visit: Payer: Self-pay

## 2021-11-25 ENCOUNTER — Emergency Department (HOSPITAL_BASED_OUTPATIENT_CLINIC_OR_DEPARTMENT_OTHER): Payer: BC Managed Care – PPO

## 2021-11-25 ENCOUNTER — Encounter: Payer: Self-pay | Admitting: Emergency Medicine

## 2021-11-25 ENCOUNTER — Ambulatory Visit
Admission: EM | Admit: 2021-11-25 | Discharge: 2021-11-25 | Disposition: A | Discharge status: Home / Self Care | Payer: BC Managed Care – PPO

## 2021-11-25 ENCOUNTER — Emergency Department (HOSPITAL_BASED_OUTPATIENT_CLINIC_OR_DEPARTMENT_OTHER)
Admission: EM | Admit: 2021-11-25 | Discharge: 2021-11-25 | Disposition: A | Discharge status: Home / Self Care | Payer: BC Managed Care – PPO | Attending: Emergency Medicine | Admitting: Emergency Medicine

## 2021-11-25 DIAGNOSIS — R1032 Left lower quadrant pain: Secondary | ICD-10-CM | POA: Diagnosis not present

## 2021-11-25 DIAGNOSIS — Y93B4 Activity, pilates: Secondary | ICD-10-CM | POA: Insufficient documentation

## 2021-11-25 DIAGNOSIS — R1012 Left upper quadrant pain: Secondary | ICD-10-CM | POA: Insufficient documentation

## 2021-11-25 DIAGNOSIS — W231XXA Caught, crushed, jammed, or pinched between stationary objects, initial encounter: Secondary | ICD-10-CM | POA: Diagnosis not present

## 2021-11-25 DIAGNOSIS — R0781 Pleurodynia: Secondary | ICD-10-CM | POA: Diagnosis not present

## 2021-11-25 DIAGNOSIS — D7389 Other diseases of spleen: Secondary | ICD-10-CM | POA: Diagnosis not present

## 2021-11-25 DIAGNOSIS — S299XXA Unspecified injury of thorax, initial encounter: Secondary | ICD-10-CM | POA: Diagnosis not present

## 2021-11-25 DIAGNOSIS — R0789 Other chest pain: Secondary | ICD-10-CM

## 2021-11-25 LAB — CBC
HCT: 39.6 % (ref 36.0–46.0)
Hemoglobin: 12.7 g/dL (ref 12.0–15.0)
MCH: 30.5 pg (ref 26.0–34.0)
MCHC: 32.1 g/dL (ref 30.0–36.0)
MCV: 95 fL (ref 80.0–100.0)
Platelets: 269 10*3/uL (ref 150–400)
RBC: 4.17 MIL/uL (ref 3.87–5.11)
RDW: 12.7 % (ref 11.5–15.5)
WBC: 5.6 10*3/uL (ref 4.0–10.5)
nRBC: 0 % (ref 0.0–0.2)

## 2021-11-25 LAB — BASIC METABOLIC PANEL
Anion gap: 7 (ref 5–15)
BUN: 11 mg/dL (ref 6–20)
CO2: 28 mmol/L (ref 22–32)
Calcium: 9.2 mg/dL (ref 8.9–10.3)
Chloride: 104 mmol/L (ref 98–111)
Creatinine, Ser: 0.56 mg/dL (ref 0.44–1.00)
GFR, Estimated: >60 mL/min (ref >60)
Glucose, Bld: 93 mg/dL (ref 70–99)
Potassium: 4.1 mmol/L (ref 3.5–5.1)
Sodium: 139 mmol/L (ref 135–145)

## 2021-11-25 MED ORDER — IOHEXOL 300 MG/ML  SOLN
100.0000 mL | Freq: Once | INTRAMUSCULAR | Status: AC | PRN
  Administered 2021-11-25: 100 mL via INTRAVENOUS

## 2021-11-25 MED ORDER — ACETAMINOPHEN 325 MG PO TABS: 650.0000 mg | ORAL_TABLET | Freq: Four times a day (QID) | ORAL | 0 refills | Status: DC | PRN

## 2021-11-25 MED ORDER — OXYCODONE HCL 5 MG PO TABS
5.0000 mg | ORAL_TABLET | Freq: Four times a day (QID) | ORAL | 0 refills | Status: DC | PRN
Start: 2021-11-25 — End: 2021-12-06

## 2021-11-25 MED ORDER — LIDOCAINE 5 % EX PTCH
1.0000 | MEDICATED_PATCH | CUTANEOUS | Status: DC
  Administered 2021-11-25: 1 via TRANSDERMAL
  Filled 2021-11-25: qty 1

## 2021-11-25 MED ORDER — ACETAMINOPHEN 500 MG PO TABS
1000.0000 mg | ORAL_TABLET | Freq: Once | ORAL | Status: AC
  Administered 2021-11-25: 1000 mg via ORAL
  Filled 2021-11-25: qty 2

## 2021-11-25 NOTE — ED Triage Notes (Signed)
Patient states that she was doing piliates on Wednesday, used a spine fitter, placed on her tummy and it caught under left sided ribs.  Patient has been very sore since. ?

## 2021-11-25 NOTE — ED Provider Notes (Signed)
?[facility] ?Provider Note ? ? ?CSN: [id number] ?Arrival date & time: 11/25/21  1023 ? ?  ? ?History ? ?Chief Complaint  ?Patient presents with  ? Rib Injury  ? ? ?Wanda Cortez is a 51 y.o. female presenting to the emergency department with injury to her ribs.  Patient reports that she was using a spine roller several days ago during a Pilates class and rolled it up under her left ribs, and had sudden significant pain on the underside of her ribs.  This includes her left lower anterior rib cage.  It is worse with inspiration, movement.  The pain has been very significant.  She has allergies to NSAIDs ? ?HPI ? ?  ? ?Home Medications ?Prior to Admission medications   ?Medication Sig Start Date End Date Taking? Authorizing Provider  ?acetaminophen (TYLENOL) 325 MG tablet Take 2 tablets (650 mg total) by mouth every 6 (six) hours as needed for up to 30 doses for mild pain or moderate pain. 11/25/21  Yes Tennile Styles, Carola Rhine, MD  ?oxyCODONE (ROXICODONE) 5 MG immediate release tablet Take 1 tablet (5 mg total) by mouth every 6 (six) hours as needed for up to 15 doses for severe pain. 11/25/21  Yes Johnella Crumm, Carola Rhine, MD  ?albuterol (PROVENTIL) (2.5 MG/3ML) 0.083% nebulizer solution Take 3 mLs (2.5 mg total) by nebulization every 4 (four) hours as needed for wheezing or shortness of breath. 08/28/21   Kozlow, Donnamarie Poag, MD  ?albuterol (VENTOLIN HFA) 108 (90 Base) MCG/ACT inhaler Inhale 2 puffs into the lungs every 6 (six) hours as needed for wheezing or shortness of breath. 08/28/21   Kozlow, Donnamarie Poag, MD  ?Ascorbic Acid (VITAMIN C) 100 MG CHEW Vitamin C    [provider]  ?B Complex Vitamins (VITAMIN B COMPLEX) TABS Take by mouth.    [provider]  ?BIOTIN PO Take 1,250 mcg by mouth daily.    [provider]  ?BLACK COHOSH-DONG QUAI PO Take by mouth 2 times daily at 12 noon and 4 pm.    [provider]  ?diphenhydrAMINE (BENADRYL) 25 mg capsule Take 25 mg by mouth 3  (three) times a week.    [provider]  ?EPINEPHrine 0.3 mg/0.3 mL IJ SOAJ injection epinephrine 0.3 mg/0.3 mL injection, auto-injector 08/28/21   Kozlow, Donnamarie Poag, MD  ?MULTIPLE VITAMINS-MINERALS ER PO Take by mouth.    [provider]  ?omeprazole (PRILOSEC) 40 MG capsule Take 1 capsule (40 mg total) by mouth in the morning and at bedtime. 08/28/21   Kozlow, Donnamarie Poag, MD  ?Specialty Vitamins Products (ADRENAL STRESS CALM PO) Take by mouth daily.    [provider]  ?   ? ?Allergies    ?Loratadine, Meloxicam, Wasp venom, Lorazepam, and Prednisone   ? ?Review of Systems   ?Review of Systems ? ?Physical Exam ?Updated Vital Signs ?BP 115/76 (BP Location: Right Arm)   Pulse 77   Temp 98 ?F (36.7 ?C)   Resp 16   Ht 5' 6.5" (1.689 m)   Wt 68.9 kg   SpO2 100%   BMI 24.17 kg/m?  ?Physical Exam ?Constitutional:   ?   General: She is not in acute distress. ?HENT:  ?   Head: Normocephalic and atraumatic.  ?Eyes:  ?   Conjunctiva/sclera: Conjunctivae normal.  ?   Pupils: Pupils are equal, round, and reactive to light.  ?Cardiovascular:  ?   Rate and Rhythm: Normal rate and regular rhythm.  ?  Pulses: Normal pulses.  ?Pulmonary:  ?   Effort: Pulmonary effort is normal. No respiratory distress.  ?Abdominal:  ?   General: There is no distension.  ?   Tenderness: There is abdominal tenderness in the left upper quadrant. There is guarding.  ?Skin: ?   General: Skin is warm and dry.  ?Neurological:  ?   General: No focal deficit present.  ?   Mental Status: She is alert and oriented to person, place, and time. Mental status is at baseline.  ? ? ?ED Results / Procedures / Treatments   ?Labs ?(all labs ordered are listed, but only abnormal results are displayed) ?Labs Reviewed  ?BASIC METABOLIC PANEL  ?CBC  ? ? ?EKG ?None ? ?Radiology ?DG Ribs Unilateral W/Chest Left ? ?Result Date: 11/25/2021 ?CLINICAL DATA:  Blunt trauma to left chest several days ago. Left rib pain. EXAM: LEFT RIBS AND CHEST - 3+ VIEW  COMPARISON:  Chest radiograph on 10/13/2020 FINDINGS: No fracture or other bone lesions are seen involving the ribs. There is no evidence of pneumothorax or pleural effusion. Both lungs are clear. Heart size and mediastinal contours are within normal limits. IMPRESSION: Negative. Electronically Signed   By: Marlaine Hind M.D.   On: 11/25/2021 12:11  ? ?CT ABDOMEN PELVIS W CONTRAST ? ?Result Date: 11/25/2021 ?CLINICAL DATA:  Left upper quadrant tenderness EXAM: CT ABDOMEN AND PELVIS WITH CONTRAST TECHNIQUE: Multidetector CT imaging of the abdomen and pelvis was performed using the standard protocol following bolus administration of intravenous contrast. RADIATION DOSE REDUCTION: This exam was performed according to the departmental dose-optimization program which includes automated exposure control, adjustment of the mA and/or kV according to patient size and/or use of iterative reconstruction technique. CONTRAST:  172m OMNIPAQUE IOHEXOL 300 MG/ML  SOLN COMPARISON:  CT abdomen and pelvis dated September 21, 2021 FINDINGS: Lower chest: No acute abnormality. Hepatobiliary: No focal liver abnormality is seen. No gallstones, gallbladder wall thickening, or biliary dilatation. Pancreas: Unremarkable. No pancreatic ductal dilatation or surrounding inflammatory changes. Spleen: Punctate hyper density of the left spleen is unchanged when compared to prior exam and likely a small cyst. No evidence of splenic laceration or perisplenic hematoma. Adrenals/Urinary Tract: Adrenal glands are unremarkable. Kidneys are normal, without renal calculi, focal lesion, or hydronephrosis. Bladder is decompressed. Stomach/Bowel: Stomach is within normal limits. Appendix is surgically absent. No evidence of bowel wall thickening, distention, or inflammatory changes. Vascular/Lymphatic: Aortic atherosclerosis. No enlarged abdominal or pelvic lymph nodes. Reproductive: Fibroid uterus. New cystic lesions of the left ovary, largest measures 1.6 cm,  likely physiologic with no further follow-up imaging recommended. Other: No abdominal wall hernia or abnormality. No abdominopelvic ascites. Musculoskeletal: No acute or significant osseous findings. IMPRESSION: 1. No acute findings in the abdomen or pelvis, including no evidence of splenic injury. 2. Fibroid uterus. Electronically Signed   By: LYetta GlassmanM.D.   On: 11/25/2021 13:15   ? ?Procedures ?Procedures  ? ? ?Medications Ordered in ED ?Medications  ?lidocaine (LIDODERM) 5 % 1 patch (1 patch Transdermal Patch Applied 11/25/21 1201)  ?acetaminophen (TYLENOL) tablet 1,000 mg (1,000 mg Oral Given 11/25/21 1159)  ?iohexol (OMNIPAQUE) 300 MG/ML solution 100 mL (100 mLs Intravenous Contrast Given 11/25/21 1248)  ? ? ?ED Course/ Medical Decision Making/ A&P ?Clinical Course as of 11/25/21 1332  ?Sun Nov 25, 2021  ?1325 DG Ribs Unilateral W/Chest Left [MT]  ?  ?Clinical Course User Index ?[MT] TWyvonnia Dusky MD  ? ?                        ?  Medical Decision Making ?Amount and/or Complexity of Data Reviewed ?Labs: ordered. ?Radiology: ordered. Decision-making details documented in ED Course. ? ?Risk ?OTC drugs. ?Prescription drug management. ? ? ?Chest wall and left upper quadrant abdominal pain after an injury several days ago.  Differential gnosis include rib fractures versus contusion versus splenic injury versus other.  X-ray of the chest ordered and personally reviewed showing no underlying pneumothorax and no visible rib fracture, although it remains a possibility.  Given her right upper quadrant abdominal tenderness reported lightheadedness at home and nausea, I thought CT of the abdomen to evaluate for splenic puncture laceration was reasonable.  Subsequent scan was reviewed by myself and interpreted, agree with the radiologist that there is no acute visible bleeding, she has a small hyperdensity that was present on prior imaging.  She also has uterine fibroids which I made her aware of, and she was aware of  already. ? ?Tylenol and Lidoderm patch applied in the ED, not providing much relief of her pain.  I will provide some oxycodone for pain relief at home.  Incentive spirometer training provided at the bedside.  We discu

## 2021-11-25 NOTE — Discharge Instructions (Signed)
You have a suspected rib fracture on your left side.  We do not see it on an x-ray but many rib fractures are missed on x-rays.  Most rib fractures take 4 to 6 weeks to heal.  For the next 10 days, I recommend to use incentive spirometer, taking 10 slow breaths and at a time, 10 times a day, for 10 full days.  This is to prevent pneumonia from developing in your lungs. ? ?

## 2021-11-25 NOTE — ED Provider Notes (Signed)
?[facility] ? ? ? ?CSN: [id number] ?Arrival date & time: 11/25/21  3428 ? ? ?  ? ?History   ?Chief Complaint ?Chief Complaint  ?Patient presents with  ? Rib Injury  ? ? ?HPI ?Wanda Cortez is a 51 y.o. female.  ? ?Patient presents with left rib pain and left abdominal pain that started approximately 5 days ago after an injury.  Patient reports that she was using a spine fitter while doing Pilates.  She placed it on her stomach and it got caught under her left ribs and was pushed underneath her ribs.  Patient has been having significant pain since this happened.  Any type of movement exacerbates pain.  Denies nausea, vomiting, diarrhea.  Denies blood in stool.  Patient has not taken any medications for pain as she is allergic to NSAIDs and is not sure what to take. ? ? ? ?Past Medical History:  ?Diagnosis Date  ? Asthma   ? Bronchitis   ? Pleurisy   ? Recurrent upper respiratory infection (URI)   ? Urticaria   ? ? ?Patient Active Problem List  ? Diagnosis Date Noted  ? Personal history of asthma 06/07/2021  ? Food allergy 06/07/2021  ? Shoulder effusion, right 03/21/2021  ? 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/21/2021  ? Cervical radiculopathy at C6 03/16/2021  ? Menopausal flushing 07/06/2020  ? Lipoma of left lower extremity 01/04/2020  ? Cervical cancer ([place]) 07/12/2019  ? Polyp of cervix 07/12/2019  ? Tear of right scapholunate ligament 03/30/2019  ? Essential hypertension 02/25/2019  ? Chronic pain of right knee 09/22/2018  ? Acute lateral meniscus tear of left knee 06/10/2017  ? Submucous leiomyoma of uterus 03/21/2016  ? Uterine leiomyoma 03/21/2016  ? Allergy to insect stings 12/27/2014  ? ? ?Past Surgical History:  ?Procedure Laterality Date  ? APPENDECTOMY    ? KNEE SURGERY Right 2018  ? ? ?OB History   ?No obstetric history on file. ?  ? ? ? ?Home Medications   ? ?Prior to Admission medications   ?Medication Sig Start Date End Date Taking? Authorizing Provider  ?albuterol (PROVENTIL) (2.5  MG/3ML) 0.083% nebulizer solution Take 3 mLs (2.5 mg total) by nebulization every 4 (four) hours as needed for wheezing or shortness of breath. 08/28/21  Yes Kozlow, Donnamarie Poag, MD  ?albuterol (VENTOLIN HFA) 108 (90 Base) MCG/ACT inhaler Inhale 2 puffs into the lungs every 6 (six) hours as needed for wheezing or shortness of breath. 08/28/21  Yes Kozlow, Donnamarie Poag, MD  ?Ascorbic Acid (VITAMIN C) 100 MG CHEW Vitamin C   Yes [provider]  ?B Complex Vitamins (VITAMIN B COMPLEX) TABS Take by mouth.   Yes [provider]  ?BIOTIN PO Take 1,250 mcg by mouth daily.   Yes [provider]  ?BLACK COHOSH-DONG QUAI PO Take by mouth 2 times daily at 12 noon and 4 pm.   Yes [provider]  ?diphenhydrAMINE (BENADRYL) 25 mg capsule Take 25 mg by mouth 3 (three) times a week.   Yes [provider]  ?EPINEPHrine 0.3 mg/0.3 mL IJ SOAJ injection epinephrine 0.3 mg/0.3 mL injection, auto-injector 08/28/21  Yes Kozlow, Donnamarie Poag, MD  ?MULTIPLE VITAMINS-MINERALS ER PO Take by mouth.   Yes [provider]  ?omeprazole (PRILOSEC) 40 MG capsule Take 1 capsule (40 mg total) by mouth in the morning and at bedtime. 08/28/21  Yes Kozlow, Donnamarie Poag, MD  ?Specialty Vitamins Products (ADRENAL STRESS CALM PO) Take by mouth daily.  Yes [provider]  ? ? ?Family History ?Family History  ?Problem Relation Age of Onset  ? Asthma Mother   ? Allergic rhinitis Mother   ? Breast cancer Mother   ? Allergic rhinitis Father   ? Stroke Father   ? Hypertension Father   ? Allergic rhinitis Sister   ? Arthritis Sister   ? Allergic rhinitis Sister   ? Heart disease Paternal Grandmother   ? Heart disease Paternal Grandfather   ? Allergic rhinitis Niece   ? Allergic rhinitis Nephew   ? ? ?Social History ?Social History  ? ?Tobacco Use  ? Smoking status: Never  ? Smokeless tobacco: Never  ?Vaping Use  ? Vaping Use: Never used  ?Substance Use Topics  ? Alcohol use: No  ? Drug use: No  ? ? ? ?Allergies   ?Loratadine,  Meloxicam, Wasp venom, Lorazepam, and Prednisone ? ? ?Review of Systems ?Review of Systems ?Per HPI ? ?Physical Exam ?Triage Vital Signs ?ED Triage Vitals  ?Enc Vitals Group  ?   BP 11/25/21 0924 128/74  ?   Pulse Rate 11/25/21 0924 77  ?   Resp 11/25/21 0924 18  ?   Temp 11/25/21 0924 98.3 ?F (36.8 ?C)  ?   Temp Source 11/25/21 0924 Oral  ?   SpO2 11/25/21 0924 98 %  ?   Weight 11/25/21 0925 152 lb 6.4 oz (69.1 kg)  ?   Height 11/25/21 0925 5' 6.5" (1.689 m)  ?   Head Circumference --   ?   Peak Flow --   ?   Pain Score 11/25/21 0925 8  ?   Pain Loc --   ?   Pain Edu? --   ?   Excl. in [place]? --   ? ?No data found. ? ?Updated Vital Signs ?BP 128/74 (BP Location: Left Arm)   Pulse 77   Temp 98.3 ?F (36.8 ?C) (Oral)   Resp 18   Ht 5' 6.5" (1.689 m)   Wt 152 lb 6.4 oz (69.1 kg)   SpO2 98%   BMI 24.23 kg/m?  ? ?Visual Acuity ?Right Eye Distance:   ?Left Eye Distance:   ?Bilateral Distance:   ? ?Right Eye Near:   ?Left Eye Near:    ?Bilateral Near:    ? ?Physical Exam ?Constitutional:   ?   General: She is not in acute distress. ?   Appearance: Normal appearance. She is not toxic-appearing or diaphoretic.  ?   Comments: Patient is tearful during exam.   ?HENT:  ?   Head: Normocephalic and atraumatic.  ?Eyes:  ?   Extraocular Movements: Extraocular movements intact.  ?   Conjunctiva/sclera: Conjunctivae normal.  ?Cardiovascular:  ?   Rate and Rhythm: Normal rate and regular rhythm.  ?   Pulses: Normal pulses.  ?   Heart sounds: Normal heart sounds.  ?Pulmonary:  ?   Effort: Pulmonary effort is normal. No respiratory distress.  ?   Breath sounds: Normal breath sounds.  ?Chest:  ?   Chest wall: Tenderness present.  ?   Comments: Tenderness to palpation to left lower rib cage.  No obvious discoloration, swelling, crepitus noted. ?Abdominal:  ?   General: Abdomen is flat. Bowel sounds are normal. There is no distension.  ?   Palpations: Abdomen is soft.  ?   Tenderness: There is abdominal tenderness in the left upper  quadrant and left lower quadrant.  ?   Comments: Tenderness to palpation throughout left side of  abdomen.  No obvious distention, discoloration, lacerations, abrasions noted.  ?Neurological:  ?   General: No focal deficit present.  ?   Mental Status: She is alert and oriented to person, place, and time. Mental status is at baseline.  ?Psychiatric:     ?   Mood and Affect: Mood normal.     ?   Behavior: Behavior normal.     ?   Thought Content: Thought content normal.     ?   Judgment: Judgment normal.  ? ? ? ?UC Treatments / Results  ?Labs ?(all labs ordered are listed, but only abnormal results are displayed) ?Labs Reviewed - No data to display ? ?EKG ? ? ?Radiology ?No results found. ? ?Procedures ?Procedures (including critical care time) ? ?Medications Ordered in UC ?Medications - No data to display ? ?Initial Impression / Assessment and Plan / UC Course  ?I have reviewed the triage vital signs and the nursing notes. ? ?Pertinent labs & imaging results that were available during my care of the patient were reviewed by me and considered in my medical decision making (see chart for details). ? ?  ? ?Given the pain is in the left rib cage as well as in the left abdomen and there is significant tenderness to palpation with patient becoming tearful on physical exam, I do think that CT imaging would be most sensitive to determine cause of patient's symptoms.  Only have x-ray in urgent care.  Advised patient that she will need to go to the emergency department to have CT imaging and further evaluation and management.  Patient was agreeable with plan.  Vital signs stable at discharge.  Agree with patient self transport to the hospital. ?Final Clinical Impressions(s) / UC Diagnoses  ? ?Final diagnoses:  ?Rib pain on left side  ?Abdominal pain, left lower quadrant  ? ? ? ?Discharge Instructions   ? ?  ?Please go to the emergency department as soon as you leave urgent care for further evaluation and management. ? ? ? ?ED  Prescriptions   ?None ?  ? ?PDMP not reviewed this encounter. ?  ?Teodora Medici, [place] ?11/25/21 7681 ? ?

## 2021-11-25 NOTE — ED Triage Notes (Signed)
Pt arrives POV with c/o left side rib pain. ? ?She was doing pilates on Wednesday, and was using a device called a spine fitter. ? ?Placed the spine fitter on her abdomin and it got caught under the left side ribs. ? ?Reports soreness to this area that continues to get worse. ? ?Hurts to laugh, cough, deep breathe. ? ?Allergic to NSAID's so has not used these medications. ? ?Ambulatory to triage, in NAD. ?

## 2021-11-25 NOTE — ED Notes (Signed)
Discharge paperwork given and understood. 

## 2021-11-25 NOTE — Discharge Instructions (Signed)
Please go to the emergency department as soon as you leave urgent care for further evaluation and management. ?

## 2021-12-04 ENCOUNTER — Ambulatory Visit: Payer: BC Managed Care – PPO | Admitting: Allergy and Immunology

## 2021-12-06 ENCOUNTER — Ambulatory Visit: Payer: BC Managed Care – PPO | Admitting: Family Medicine

## 2021-12-06 VITALS — BP 118/74 | HR 84 | Temp 97.4°F | Ht 66.5 in | Wt 153.6 lb

## 2021-12-06 DIAGNOSIS — S301XXD Contusion of abdominal wall, subsequent encounter: Secondary | ICD-10-CM

## 2021-12-06 DIAGNOSIS — L231 Allergic contact dermatitis due to adhesives: Secondary | ICD-10-CM | POA: Diagnosis not present

## 2021-12-06 DIAGNOSIS — R0781 Pleurodynia: Secondary | ICD-10-CM

## 2021-12-06 MED ORDER — TRIAMCINOLONE ACETONIDE 0.1 % EX CREA
1.0000 | TOPICAL_CREAM | Freq: Two times a day (BID) | CUTANEOUS | 0 refills | Status: DC
Start: 2021-12-06 — End: 2023-02-14

## 2021-12-06 NOTE — Progress Notes (Signed)
[facility] [address] [place] [address] Dept: [phone] Dept Fax: [phone]  Office Visit  Subjective:    Patient ID: Wanda Cortez, female    DOB: Aug 20, 1970, 51 y.o..   MRN: [id number]  Chief Complaint  Patient presents with   Follow-up    F/u rib pain after going to ER on 11/25/21.  Still having pain on the RT side.     History of Present Illness:  Patient is in today for reassessment of an injury to her left lower ribs.left upper abdomen. She was seen at Urgent Care and subsequently [facility] for an acute injury while doing Pilates. She notes she had been using a spine roller. She laid this on her abdomen while going to sit up and trapped the balls between her pelvis and ribcage. She had acute pain along her left lower rib border. She had both plain x-ray and CT scan, which were benign. She has continued to have pain, esp. with movement. She is not having any breathing issues. She was provided some hydrocodone, but prefers not to take this. She is using Tylenol, as it does help her sleep. She is also using a heating pad. She tired using a lidocaine patch, despite her history of allergic reactions to adhesives. She has broken out with a pruritic rash and has stopped the use of the patches.  Past Medical History: Patient Active Problem List   Diagnosis Date Noted   Personal history of asthma 06/07/2021   Food allergy 06/07/2021   Shoulder effusion, right 03/21/2021   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/21/2021   Cervical radiculopathy at C6 03/16/2021   Menopausal flushing 07/06/2020   Lipoma of left lower extremity 01/04/2020   Cervical cancer ([place]) 07/12/2019   Polyp of cervix 07/12/2019   Tear of right scapholunate ligament 03/30/2019   Essential hypertension 02/25/2019   Chronic pain of right knee 09/22/2018   Acute lateral meniscus tear of left knee 06/10/2017   Submucous leiomyoma of uterus  03/21/2016   Uterine leiomyoma 03/21/2016   Allergy to insect stings 12/27/2014   Past Surgical History:  Procedure Laterality Date   APPENDECTOMY     KNEE SURGERY Right 2018   Family History  Problem Relation Age of Onset   Asthma Mother    Allergic rhinitis Mother    Breast cancer Mother    Allergic rhinitis Father    Stroke Father    Hypertension Father    Allergic rhinitis Sister    Arthritis Sister    Allergic rhinitis Sister    Heart disease Paternal Grandmother    Heart disease Paternal Grandfather    Allergic rhinitis Niece    Allergic rhinitis Nephew    Outpatient Medications Prior to Visit  Medication Sig Dispense Refill   acetaminophen (TYLENOL) 325 MG tablet Take 2 tablets (650 mg total) by mouth every 6 (six) hours as needed for up to 30 doses for mild pain or moderate pain. 30 tablet 0   albuterol (PROVENTIL) (2.5 MG/3ML) 0.083% nebulizer solution Take 3 mLs (2.5 mg total) by nebulization every 4 (four) hours as needed for wheezing or shortness of breath. 75 mL 1   albuterol (VENTOLIN HFA) 108 (90 Base) MCG/ACT inhaler Inhale 2 puffs into the lungs every 6 (six) hours as needed for wheezing or shortness of breath. 8 g 2   Ascorbic Acid (VITAMIN C) 100 MG CHEW Vitamin C     B Complex Vitamins (VITAMIN B COMPLEX)  TABS Take by mouth.     BIOTIN PO Take 1,250 mcg by mouth daily.     BLACK COHOSH-DONG QUAI PO Take by mouth 2 times daily at 12 noon and 4 pm.     conjugated estrogens (PREMARIN) vaginal cream Premarin 0.625 mg/gram vaginal cream  INSERT HALF AN APPLICATORFUL (1 GRAM) VAGINALLY EVERY DAY FOR 14 DAYS     diphenhydrAMINE (BENADRYL) 25 mg capsule Take 25 mg by mouth 3 (three) times a week.     EPINEPHrine 0.3 mg/0.3 mL IJ SOAJ injection epinephrine 0.3 mg/0.3 mL injection, auto-injector 1 each 2   MULTIPLE VITAMINS-MINERALS ER PO Take by mouth.     Specialty Vitamins Products (ADRENAL STRESS CALM PO) Take by mouth daily.     omeprazole (PRILOSEC) 40 MG  capsule Take 1 capsule (40 mg total) by mouth in the morning and at bedtime. 60 capsule 5   oxyCODONE (ROXICODONE) 5 MG immediate release tablet Take 1 tablet (5 mg total) by mouth every 6 (six) hours as needed for up to 15 doses for severe pain. 15 tablet 0   No facility-administered medications prior to visit.   Allergies  Allergen Reactions   Loratadine Palpitations   Meloxicam Anaphylaxis   Wasp Venom Itching and Hives   Lorazepam Nausea And Vomiting   Prednisone Rash    Objective:   Today's Vitals   12/06/21 1031  BP: 118/74  Pulse: 84  Temp: (!) 97.4 F (36.3 C)  TempSrc: Temporal  SpO2: 99%  Weight: 153 lb 9.6 oz (69.7 kg)  Height: 5' 6.5" (1.689 m)   Body mass index is 24.42 kg/m.   General: Well developed, well nourished. No acute distress. Lungs: Clear to auscultation bilaterally. No wheezing, rales or rhonchi. Chest: Increased pain int he lateral left lower rib margin with AP compression of the chest. Abdomen: Soft. Significant tenderness with palpation over the left upper quadrant. Pain worse when raising head off the table. Back:  Skin: There is a rectangular, patterned area of erythema and mild scaliness on the left upper abdomen/flank. Psych: Alert and oriented. Normal mood and affect.  Health Maintenance Due  Topic Date Due   HIV Screening  Never done   Hepatitis C Screening  Never done   TETANUS/TDAP  Never done   Zoster Vaccines- Shingrix (1 of 2) Never done   COLONOSCOPY (Pts 45-34yr Insurance coverage will need to be confirmed)  Never done   Imaging: Rib X-ray (11/25/2021) IMPRESSION: Negative.  CT of Abdomen and Pelvis wo contrast (11/25/2021) IMPRESSION: 1. No acute abnormality in the abdomen or pelvis. 2. Large volume of formed stool throughout the colon suggesting constipation. 3. Nodular uterine contour likely reflects known uterine leiomyomas.    Assessment & Plan:   1. Contusion of abdominal wall, subsequent encounter 2. Rib pain on  left side Recommend ongoing relative rest, applying heat to the affected area, and Tylenol as needed for pain.  3. Allergic contact dermatitis due to adhesives I will provide a steroid cream for use on the area where she had an allergic reaction to the lidocaine patch.  - triamcinolone cream (KENALOG) 0.1 %; Apply 1 application. topically 2 (two) times daily.  Dispense: 30 g; Refill: 0   Return if symptoms worsen or fail to improve.   SHaydee Salter MD

## 2021-12-20 DIAGNOSIS — I6523 Occlusion and stenosis of bilateral carotid arteries: Secondary | ICD-10-CM | POA: Diagnosis not present

## 2021-12-26 ENCOUNTER — Other Ambulatory Visit (HOSPITAL_COMMUNITY): Payer: Self-pay | Admitting: *Deleted

## 2021-12-26 DIAGNOSIS — R0789 Other chest pain: Secondary | ICD-10-CM

## 2022-01-10 ENCOUNTER — Encounter: Payer: Self-pay | Admitting: Family Medicine

## 2022-01-10 ENCOUNTER — Ambulatory Visit: Payer: BC Managed Care – PPO | Admitting: Family Medicine

## 2022-01-10 ENCOUNTER — Ambulatory Visit (HOSPITAL_BASED_OUTPATIENT_CLINIC_OR_DEPARTMENT_OTHER)
Admission: RE | Admit: 2022-01-10 | Discharge: 2022-01-10 | Disposition: A | Discharge status: Home / Self Care | Payer: BC Managed Care – PPO | Source: Ambulatory Visit | Attending: Family Medicine | Admitting: Family Medicine

## 2022-01-10 VITALS — BP 108/64 | Ht 66.5 in | Wt 148.0 lb

## 2022-01-10 DIAGNOSIS — R200 Anesthesia of skin: Secondary | ICD-10-CM | POA: Diagnosis not present

## 2022-01-10 DIAGNOSIS — M5412 Radiculopathy, cervical region: Secondary | ICD-10-CM

## 2022-01-10 DIAGNOSIS — M542 Cervicalgia: Secondary | ICD-10-CM | POA: Diagnosis not present

## 2022-01-10 DIAGNOSIS — R202 Paresthesia of skin: Secondary | ICD-10-CM | POA: Diagnosis not present

## 2022-01-10 MED ORDER — PREDNISONE 5 MG PO TABS: ORAL_TABLET | ORAL | 0 refills | Status: DC

## 2022-01-10 NOTE — Progress Notes (Signed)
  Wanda Cortez - 51 y.o. female MRN [id number]  Date of birth: 14-Dec-1970  SUBJECTIVE:  Including CC & ROS.  No chief complaint on file.   Wanda Cortez is a 51 y.o. female that is presenting right pain and altered sensation from the forearm to the ulnar aspect of the hand.  Has been ongoing for about a year.  Intermittent in nature.  She is noticing more weakness with her grip strength.   Review of Systems See HPI   HISTORY: Past Medical, Surgical, Social, and Family History Reviewed & Updated per EMR.   Pertinent Historical Findings include:  Past Medical History:  Diagnosis Date   Asthma    Bronchitis    Pleurisy    Recurrent upper respiratory infection (URI)    Urticaria     Past Surgical History:  Procedure Laterality Date   APPENDECTOMY     KNEE SURGERY Right 2018     PHYSICAL EXAM:  VS: BP 108/64 (BP Location: Left Arm, Patient Position: Sitting)   Ht 5' 6.5" (1.689 m)   Wt 148 lb (67.1 kg)   BMI 23.53 kg/m  Physical Exam Gen: NAD, alert, cooperative with exam, well-appearing MSK:  Neurovascularly intact       ASSESSMENT & PLAN:   Cervical radiculopathy Acute on chronic in nature.  Symptoms seem more radicular in nature as she is having some pain in the forearm as well as weakness in the hand. -Counseled on home exercise therapy and supportive care. -Prednisone. -X-ray. -Could consider physical therapy or nerve study.

## 2022-01-10 NOTE — Patient Instructions (Signed)
Good to see you Please try heat  Please try the exercises  I will call with the xray results.   Please let me know if you would like to try physical therapy  Please send me a message in MyChart with any questions or updates.  Please see me back in 4 weeks.   --Dr. Raeford Razor

## 2022-01-10 NOTE — Assessment & Plan Note (Signed)
Acute on chronic in nature.  Symptoms seem more radicular in nature as she is having some pain in the forearm as well as weakness in the hand. -Counseled on home exercise therapy and supportive care. -Prednisone. -X-ray. -Could consider physical therapy or nerve study.

## 2022-01-11 ENCOUNTER — Telehealth: Payer: Self-pay | Admitting: Family Medicine

## 2022-01-11 DIAGNOSIS — N951 Menopausal and female climacteric states: Secondary | ICD-10-CM | POA: Diagnosis not present

## 2022-01-11 DIAGNOSIS — N920 Excessive and frequent menstruation with regular cycle: Secondary | ICD-10-CM | POA: Diagnosis not present

## 2022-01-11 DIAGNOSIS — Z6824 Body mass index (BMI) 24.0-24.9, adult: Secondary | ICD-10-CM | POA: Diagnosis not present

## 2022-01-11 MED ORDER — GABAPENTIN 300 MG PO CAPS: 300.0000 mg | ORAL_CAPSULE | Freq: Three times a day (TID) | ORAL | 1 refills | Status: DC

## 2022-01-17 ENCOUNTER — Inpatient Hospital Stay (HOSPITAL_BASED_OUTPATIENT_CLINIC_OR_DEPARTMENT_OTHER): Admission: RE | Admit: 2022-01-17 | Payer: BC Managed Care – PPO | Source: Ambulatory Visit

## 2022-01-17 DIAGNOSIS — M9901 Segmental and somatic dysfunction of cervical region: Secondary | ICD-10-CM | POA: Diagnosis not present

## 2022-01-17 DIAGNOSIS — M50122 Cervical disc disorder at C5-C6 level with radiculopathy: Secondary | ICD-10-CM | POA: Diagnosis not present

## 2022-01-17 DIAGNOSIS — M5384 Other specified dorsopathies, thoracic region: Secondary | ICD-10-CM | POA: Diagnosis not present

## 2022-01-17 DIAGNOSIS — M9902 Segmental and somatic dysfunction of thoracic region: Secondary | ICD-10-CM | POA: Diagnosis not present

## 2022-01-18 DIAGNOSIS — N951 Menopausal and female climacteric states: Secondary | ICD-10-CM | POA: Diagnosis not present

## 2022-01-18 DIAGNOSIS — R635 Abnormal weight gain: Secondary | ICD-10-CM | POA: Diagnosis not present

## 2022-01-24 DIAGNOSIS — R635 Abnormal weight gain: Secondary | ICD-10-CM | POA: Diagnosis not present

## 2022-01-24 DIAGNOSIS — N951 Menopausal and female climacteric states: Secondary | ICD-10-CM | POA: Diagnosis not present

## 2022-01-24 DIAGNOSIS — Z1339 Encounter for screening examination for other mental health and behavioral disorders: Secondary | ICD-10-CM | POA: Diagnosis not present

## 2022-01-24 DIAGNOSIS — Z6824 Body mass index (BMI) 24.0-24.9, adult: Secondary | ICD-10-CM | POA: Diagnosis not present

## 2022-01-24 DIAGNOSIS — Z1331 Encounter for screening for depression: Secondary | ICD-10-CM | POA: Diagnosis not present

## 2022-01-28 ENCOUNTER — Other Ambulatory Visit: Payer: Self-pay | Admitting: Obstetrics and Gynecology

## 2022-01-28 DIAGNOSIS — M47816 Spondylosis without myelopathy or radiculopathy, lumbar region: Secondary | ICD-10-CM | POA: Diagnosis not present

## 2022-01-28 DIAGNOSIS — Z1231 Encounter for screening mammogram for malignant neoplasm of breast: Secondary | ICD-10-CM

## 2022-02-07 ENCOUNTER — Ambulatory Visit: Payer: BC Managed Care – PPO | Admitting: Family Medicine

## 2022-02-08 ENCOUNTER — Other Ambulatory Visit (HOSPITAL_BASED_OUTPATIENT_CLINIC_OR_DEPARTMENT_OTHER): Payer: BC Managed Care – PPO

## 2022-03-14 ENCOUNTER — Ambulatory Visit
Admission: RE | Admit: 2022-03-14 | Discharge: 2022-03-14 | Disposition: A | Discharge status: Home / Self Care | Payer: BC Managed Care – PPO | Source: Ambulatory Visit | Attending: Obstetrics and Gynecology | Admitting: Obstetrics and Gynecology

## 2022-03-14 DIAGNOSIS — Z1231 Encounter for screening mammogram for malignant neoplasm of breast: Secondary | ICD-10-CM

## 2022-03-14 DIAGNOSIS — R5383 Other fatigue: Secondary | ICD-10-CM | POA: Diagnosis not present

## 2022-03-14 DIAGNOSIS — N951 Menopausal and female climacteric states: Secondary | ICD-10-CM | POA: Diagnosis not present

## 2022-03-18 ENCOUNTER — Other Ambulatory Visit: Payer: Self-pay | Admitting: Obstetrics and Gynecology

## 2022-03-18 DIAGNOSIS — R928 Other abnormal and inconclusive findings on diagnostic imaging of breast: Secondary | ICD-10-CM

## 2022-03-21 DIAGNOSIS — Z6824 Body mass index (BMI) 24.0-24.9, adult: Secondary | ICD-10-CM | POA: Diagnosis not present

## 2022-03-21 DIAGNOSIS — R5383 Other fatigue: Secondary | ICD-10-CM | POA: Diagnosis not present

## 2022-03-21 DIAGNOSIS — N898 Other specified noninflammatory disorders of vagina: Secondary | ICD-10-CM | POA: Diagnosis not present

## 2022-03-21 DIAGNOSIS — N951 Menopausal and female climacteric states: Secondary | ICD-10-CM | POA: Diagnosis not present

## 2022-03-28 ENCOUNTER — Ambulatory Visit
Admission: RE | Admit: 2022-03-28 | Discharge: 2022-03-28 | Disposition: A | Discharge status: Home / Self Care | Payer: BC Managed Care – PPO | Source: Ambulatory Visit | Attending: Obstetrics and Gynecology | Admitting: Obstetrics and Gynecology

## 2022-03-28 ENCOUNTER — Ambulatory Visit: Admission: RE | Admit: 2022-03-28 | Payer: BC Managed Care – PPO | Source: Ambulatory Visit

## 2022-03-28 DIAGNOSIS — R922 Inconclusive mammogram: Secondary | ICD-10-CM | POA: Diagnosis not present

## 2022-03-28 DIAGNOSIS — R928 Other abnormal and inconclusive findings on diagnostic imaging of breast: Secondary | ICD-10-CM

## 2022-05-31 DIAGNOSIS — R5383 Other fatigue: Secondary | ICD-10-CM | POA: Diagnosis not present

## 2022-05-31 DIAGNOSIS — N951 Menopausal and female climacteric states: Secondary | ICD-10-CM | POA: Diagnosis not present

## 2022-06-07 DIAGNOSIS — G479 Sleep disorder, unspecified: Secondary | ICD-10-CM | POA: Diagnosis not present

## 2022-06-07 DIAGNOSIS — N951 Menopausal and female climacteric states: Secondary | ICD-10-CM | POA: Diagnosis not present

## 2022-06-07 DIAGNOSIS — R232 Flushing: Secondary | ICD-10-CM | POA: Diagnosis not present

## 2022-06-07 DIAGNOSIS — Z6824 Body mass index (BMI) 24.0-24.9, adult: Secondary | ICD-10-CM | POA: Diagnosis not present

## 2022-06-20 ENCOUNTER — Ambulatory Visit: Payer: BC Managed Care – PPO | Admitting: Family Medicine

## 2022-06-20 ENCOUNTER — Encounter: Payer: Self-pay | Admitting: Family Medicine

## 2022-06-20 ENCOUNTER — Ambulatory Visit (HOSPITAL_BASED_OUTPATIENT_CLINIC_OR_DEPARTMENT_OTHER)
Admission: RE | Admit: 2022-06-20 | Discharge: 2022-06-20 | Disposition: A | Discharge status: Home / Self Care | Payer: BC Managed Care – PPO | Source: Ambulatory Visit | Attending: Family Medicine | Admitting: Family Medicine

## 2022-06-20 ENCOUNTER — Ambulatory Visit: Payer: Self-pay

## 2022-06-20 VITALS — BP 118/70 | Ht 67.0 in | Wt 158.0 lb

## 2022-06-20 DIAGNOSIS — M25511 Pain in right shoulder: Secondary | ICD-10-CM

## 2022-06-20 DIAGNOSIS — M67911 Unspecified disorder of synovium and tendon, right shoulder: Secondary | ICD-10-CM | POA: Insufficient documentation

## 2022-06-20 DIAGNOSIS — S46011A Strain of muscle(s) and tendon(s) of the rotator cuff of right shoulder, initial encounter: Secondary | ICD-10-CM | POA: Insufficient documentation

## 2022-06-20 NOTE — Assessment & Plan Note (Signed)
Acutely occurring.  Had an injury about 6 weeks ago.  Now having mechanical symptoms and significant pain.  Ultrasound revealing for supraspinatus tear on exam. -Counseled on home exercise therapy and supportive care. -X-ray. -MRI of the right shoulder to evaluate for rotator cuff tear and for presurgical planning.

## 2022-06-20 NOTE — Patient Instructions (Signed)
Good to see you Please use heat  We'll call with the xray results from today  We'll get the MRI at [facility]   Please send me a message in [place] with any questions or updates.  We'll setup a virtual once the MRI is resulted.   --Dr. Raeford Razor

## 2022-06-20 NOTE — Progress Notes (Signed)
  Wanda Cortez - 51 y.o. female MRN [id number]  Date of birth: 04/17/1971  SUBJECTIVE:  Including CC & ROS.  No chief complaint on file.   Wanda Cortez is a 51 y.o. female that is presenting with acute right shoulder pain.  Having significant shoulder pain over the past 6 weeks.  She initially felt the pain while she was taking care of her father.  Had an injury while shifting him in bed.  No history of surgery.   Review of Systems See HPI   HISTORY: Past Medical, Surgical, Social, and Family History Reviewed & Updated per EMR.   Pertinent Historical Findings include:  Past Medical History:  Diagnosis Date   Asthma    Bronchitis    Pleurisy    Recurrent upper respiratory infection (URI)    Urticaria     Past Surgical History:  Procedure Laterality Date   APPENDECTOMY     KNEE SURGERY Right 2018     PHYSICAL EXAM:  VS: BP 118/70   Ht '5\' 7"'$  (1.702 m)   Wt 158 lb (71.7 kg)   LMP 09/01/2020   BMI 24.75 kg/m  Physical Exam Gen: NAD, alert, cooperative with exam, well-appearing MSK:  Right shoulder: Limited internal and external rotation. Positive decant test. Positive speeds test. Neurovascularly intact    Limited ultrasound: Right shoulder pain:  Mild effusion within the bicipital tendon sheath. Overlying effusion of the subscapularis. Partial incomplete tear of the anterior leaflet of the supraspinatus  Summary: Effusion and rotator cuff tear.  Ultrasound and interpretation by Clearance Coots, MD    ASSESSMENT & PLAN:   Traumatic incomplete tear of right rotator cuff Acutely occurring.  Had an injury about 6 weeks ago.  Now having mechanical symptoms and significant pain.  Ultrasound revealing for supraspinatus tear on exam. -Counseled on home exercise therapy and supportive care. -X-ray. -MRI of the right shoulder to evaluate for rotator cuff tear and for presurgical planning.

## 2022-06-25 ENCOUNTER — Telehealth: Payer: Self-pay | Admitting: Family Medicine

## 2022-06-25 NOTE — Telephone Encounter (Signed)
Informed of results.   Rosemarie Ax, MD [facility] 06/25/2022, 4:15 PM

## 2022-06-26 NOTE — Telephone Encounter (Signed)
Left detailed message provided pt with [facility] imaging ph # to schedule Rt shoulder MRI. It appears Josem Kaufmann is valid 06/21/22- 07/20/22.

## 2022-06-29 ENCOUNTER — Ambulatory Visit
Admission: RE | Admit: 2022-06-29 | Discharge: 2022-06-29 | Disposition: A | Discharge status: Home / Self Care | Payer: BC Managed Care – PPO | Source: Ambulatory Visit | Attending: Family Medicine | Admitting: Family Medicine

## 2022-06-29 DIAGNOSIS — S46011A Strain of muscle(s) and tendon(s) of the rotator cuff of right shoulder, initial encounter: Secondary | ICD-10-CM

## 2022-06-29 DIAGNOSIS — M25511 Pain in right shoulder: Secondary | ICD-10-CM | POA: Diagnosis not present

## 2022-07-01 ENCOUNTER — Encounter: Payer: Self-pay | Admitting: Family Medicine

## 2022-07-01 ENCOUNTER — Telehealth (INDEPENDENT_AMBULATORY_CARE_PROVIDER_SITE_OTHER): Payer: BC Managed Care – PPO | Admitting: Family Medicine

## 2022-07-01 VITALS — Ht 67.0 in | Wt 158.0 lb

## 2022-07-01 DIAGNOSIS — M67911 Unspecified disorder of synovium and tendon, right shoulder: Secondary | ICD-10-CM

## 2022-07-01 MED ORDER — NITROGLYCERIN 0.2 MG/HR TD PT24: MEDICATED_PATCH | TRANSDERMAL | 11 refills | Status: DC

## 2022-07-01 NOTE — Progress Notes (Signed)
Virtual Visit via Video Note  I connected with Wanda Cortez on 07/01/22 at 11:10 AM EST by a video enabled telemedicine application and verified that I am speaking with the correct person using two identifiers.  Location: Patient: home Provider: office   I discussed the limitations of evaluation and management by telemedicine and the availability of in person appointments. The patient expressed understanding and agreed to proceed.  History of Present Illness:  Wanda Cortez is a 51 year old female that is following up after the MRI of her right shoulder.  This was demonstrating tendinosis of the supraspinatus with fraying along the anterior aspect with a bursitis appreciated at the subacromial and subdeltoid.  Observations/Objective:   Assessment and Plan:  Tendinopathy of right rotator: Acutely occurring.  Tendinosis of the rotator cuff as well as bursitis appreciated. -Counseled on home exercise therapy and supportive care. -Referral to physical therapy. -Consider shockwave therapy. -Nitro patches. -Could consider injection  Follow Up Instructions:    I discussed the assessment and treatment plan with the patient. The patient was provided an opportunity to ask questions and all were answered. The patient agreed with the plan and demonstrated an understanding of the instructions.   The patient was advised to call back or seek an in-person evaluation if the symptoms worsen or if the condition fails to improve as anticipated.    Clearance Coots, MD

## 2022-07-01 NOTE — Assessment & Plan Note (Signed)
Acutely occurring.  Tendinosis of the rotator cuff as well as bursitis appreciated. -Counseled on home exercise therapy and supportive care. -Referral to physical therapy. -Consider shockwave therapy. -Nitro patches. -Could consider injection

## 2022-08-01 DIAGNOSIS — F4320 Adjustment disorder, unspecified: Secondary | ICD-10-CM | POA: Diagnosis not present

## 2022-08-15 DIAGNOSIS — F4320 Adjustment disorder, unspecified: Secondary | ICD-10-CM | POA: Diagnosis not present

## 2022-08-22 DIAGNOSIS — F4320 Adjustment disorder, unspecified: Secondary | ICD-10-CM | POA: Diagnosis not present

## 2022-08-29 DIAGNOSIS — F4320 Adjustment disorder, unspecified: Secondary | ICD-10-CM | POA: Diagnosis not present

## 2022-09-05 DIAGNOSIS — N951 Menopausal and female climacteric states: Secondary | ICD-10-CM | POA: Diagnosis not present

## 2022-09-05 DIAGNOSIS — R5383 Other fatigue: Secondary | ICD-10-CM | POA: Diagnosis not present

## 2022-09-12 DIAGNOSIS — R232 Flushing: Secondary | ICD-10-CM | POA: Diagnosis not present

## 2022-09-12 DIAGNOSIS — Z6825 Body mass index (BMI) 25.0-25.9, adult: Secondary | ICD-10-CM | POA: Diagnosis not present

## 2022-09-12 DIAGNOSIS — R6882 Decreased libido: Secondary | ICD-10-CM | POA: Diagnosis not present

## 2022-09-12 DIAGNOSIS — N951 Menopausal and female climacteric states: Secondary | ICD-10-CM | POA: Diagnosis not present

## 2022-10-03 DIAGNOSIS — Z803 Family history of malignant neoplasm of breast: Secondary | ICD-10-CM | POA: Diagnosis not present

## 2022-10-03 DIAGNOSIS — Z8049 Family history of malignant neoplasm of other genital organs: Secondary | ICD-10-CM | POA: Diagnosis not present

## 2022-10-03 DIAGNOSIS — Z124 Encounter for screening for malignant neoplasm of cervix: Secondary | ICD-10-CM | POA: Diagnosis not present

## 2022-10-03 DIAGNOSIS — Z01419 Encounter for gynecological examination (general) (routine) without abnormal findings: Secondary | ICD-10-CM | POA: Diagnosis not present

## 2022-10-03 DIAGNOSIS — Z8541 Personal history of malignant neoplasm of cervix uteri: Secondary | ICD-10-CM | POA: Diagnosis not present

## 2022-10-03 DIAGNOSIS — Z6826 Body mass index (BMI) 26.0-26.9, adult: Secondary | ICD-10-CM | POA: Diagnosis not present

## 2022-11-04 ENCOUNTER — Encounter: Payer: Self-pay | Admitting: *Deleted

## 2022-11-12 ENCOUNTER — Ambulatory Visit: Payer: BC Managed Care – PPO | Admitting: Nurse Practitioner

## 2022-11-12 ENCOUNTER — Encounter: Payer: Self-pay | Admitting: Nurse Practitioner

## 2022-11-12 VITALS — BP 100/80 | HR 76 | Temp 98.6°F | Resp 16 | Ht 67.0 in | Wt 162.2 lb

## 2022-11-12 DIAGNOSIS — J209 Acute bronchitis, unspecified: Secondary | ICD-10-CM

## 2022-11-12 DIAGNOSIS — Z1211 Encounter for screening for malignant neoplasm of colon: Secondary | ICD-10-CM

## 2022-11-12 DIAGNOSIS — J029 Acute pharyngitis, unspecified: Secondary | ICD-10-CM

## 2022-11-12 LAB — POC COVID19 BINAXNOW: SARS Coronavirus 2 Ag: NEGATIVE

## 2022-11-12 LAB — POCT RAPID STREP A (OFFICE): Rapid Strep A Screen: NEGATIVE

## 2022-11-12 MED ORDER — BENZONATATE 200 MG PO CAPS: 200.0000 mg | ORAL_CAPSULE | Freq: Three times a day (TID) | ORAL | 0 refills | Status: DC | PRN

## 2022-11-12 MED ORDER — ALBUTEROL SULFATE (2.5 MG/3ML) 0.083% IN NEBU
2.5000 mg | INHALATION_SOLUTION | Freq: Once | RESPIRATORY_TRACT | Status: AC
  Administered 2022-11-12: 2.5 mg via RESPIRATORY_TRACT

## 2022-11-12 MED ORDER — ALBUTEROL SULFATE HFA 108 (90 BASE) MCG/ACT IN AERS: 1.0000 | INHALATION_SPRAY | Freq: Four times a day (QID) | RESPIRATORY_TRACT | 0 refills | Status: DC | PRN

## 2022-11-12 NOTE — Patient Instructions (Signed)
URI Instructions: Encourage adequate oral hydration. Use claritin or zyrtec 1tab and bedtime x 7week Use mucinex DM or Robitussin  or delsym for cough.  You can use plain "Tylenol" or "Advil" for fever, chills and achyness. Use cool mist humidifier at bedtime to help with nasal congestion and cough.  Cold/cough medications may have tylenol or ibuprofen or guaifenesin or dextromethophan in them, so be careful not to take beyond the recommended dose for each of these medications.   "Common cold" symptoms are usually triggered by a virus.  The antibiotics are usually not necessary. On average, a" viral cold" illness may take 7-10 days to resolve. Please, make an appointment if you are not better or if you're worse.

## 2022-11-12 NOTE — Progress Notes (Signed)
Established Patient Visit  Patient: Wanda Cortez   DOB: Mar 25, 1971   52 y.o. Female  MRN: [id number] Visit Date: 11/12/2022  Subjective:    Chief Complaint  Patient presents with   Sore Throat    Difficulty swallowing and a dry cough.  Started Friday    Sore Throat  Associated symptoms include congestion, coughing and headaches. Pertinent negatives include no abdominal pain, diarrhea, ear pain, plugged ear sensation, neck pain, swollen glands or vomiting.  URI  This is a new problem. The current episode started in the past 7 days. The problem has been unchanged. There has been no fever. Associated symptoms include congestion, coughing, headaches, joint pain, rhinorrhea, sinus pain and wheezing. Pertinent negatives include no abdominal pain, chest pain, diarrhea, dysuria, ear pain, joint swelling, nausea, neck pain, plugged ear sensation, rash, sneezing, sore throat, swollen glands or vomiting. Treatments tried: zarbee. The treatment provided no relief.  Unable to tolerate systemic corticosteriod. No known sick contact, no travel  Reviewed medical, surgical, and social history today  Medications: Outpatient Medications Prior to Visit  Medication Sig   acetaminophen (TYLENOL) 325 MG tablet Take 2 tablets (650 mg total) by mouth every 6 (six) hours as needed for up to 30 doses for mild pain or moderate pain.   Ascorbic Acid (VITAMIN C) 100 MG CHEW Vitamin C   B Complex Vitamins (VITAMIN B COMPLEX) TABS Take by mouth.   BIOTIN PO Take 1,250 mcg by mouth daily.   BLACK COHOSH-DONG QUAI PO Take by mouth 2 times daily at 12 noon and 4 pm.   conjugated estrogens (PREMARIN) vaginal cream Premarin 0.625 mg/gram vaginal cream  INSERT HALF AN APPLICATORFUL (1 GRAM) VAGINALLY EVERY DAY FOR 14 DAYS   diphenhydrAMINE (BENADRYL) 25 mg capsule Take 25 mg by mouth 3 (three) times a week.   EPINEPHrine 0.3 mg/0.3 mL IJ SOAJ injection epinephrine 0.3 mg/0.3 mL injection, auto-injector    gabapentin (NEURONTIN) 300 MG capsule Take 1 capsule (300 mg total) by mouth 3 (three) times daily.   MULTIPLE VITAMINS-MINERALS ER PO Take by mouth.   nitroGLYCERIN (NITRODUR - DOSED IN MG/24 HR) 0.2 mg/hr patch Cut and apply 1/4 patch to most painful area q24h.   progesterone (PROMETRIUM) 100 MG capsule Take 100 mg by mouth daily.   Specialty Vitamins Products (ADRENAL STRESS CALM PO) Take by mouth daily.   triamcinolone cream (KENALOG) 0.1 % Apply 1 application. topically 2 (two) times daily.   [DISCONTINUED] albuterol (PROVENTIL) (2.5 MG/3ML) 0.083% nebulizer solution Take 3 mLs (2.5 mg total) by nebulization every 4 (four) hours as needed for wheezing or shortness of breath.   [DISCONTINUED] albuterol (VENTOLIN HFA) 108 (90 Base) MCG/ACT inhaler Inhale 2 puffs into the lungs every 6 (six) hours as needed for wheezing or shortness of breath.   [DISCONTINUED] predniSONE (DELTASONE) 5 MG tablet Take 6 pills for first day, 5 pills second day, 4 pills third day, 3 pills fourth day, 2 pills the fifth day, and 1 pill sixth day. (Patient not taking: Reported on 11/12/2022)   No facility-administered medications prior to visit.   Reviewed past medical and social history.   ROS per HPI above      Objective:  BP 100/80 (BP Location: Right Arm, Patient Position: Sitting, Cuff Size: Large)   Pulse 76   Temp 98.6 F (37 C) (Oral)   Resp 16   Ht  (1.702 m)  Wt 162 lb 3.2 oz (73.6 kg)   LMP 09/01/2020   SpO2 100%   BMI 25.40 kg/m      Physical Exam Constitutional:      General: She is not in acute distress. HENT:     Right Ear: Tympanic membrane, ear canal and external ear normal.     Left Ear: Tympanic membrane, ear canal and external ear normal.     Nose: No nasal tenderness, mucosal edema, congestion or rhinorrhea.     Right Nostril: No occlusion.     Left Nostril: No occlusion.     Right Turbinates: Not enlarged, swollen or pale.     Left Turbinates: Not enlarged, swollen or  pale.     Right Sinus: No maxillary sinus tenderness or frontal sinus tenderness.     Left Sinus: No maxillary sinus tenderness or frontal sinus tenderness.     Mouth/Throat:     Pharynx: Oropharynx is clear. Uvula midline.     Tonsils: No tonsillar exudate or tonsillar abscesses.  Eyes:     Extraocular Movements: Extraocular movements intact.     Conjunctiva/sclera: Conjunctivae normal.  Cardiovascular:     Rate and Rhythm: Normal rate and regular rhythm.     Pulses: Normal pulses.     Heart sounds: Normal heart sounds.  Pulmonary:     Effort: Pulmonary effort is normal.     Breath sounds: Normal breath sounds.  Musculoskeletal:     Cervical back: Normal range of motion and neck supple.  Lymphadenopathy:     Cervical: No cervical adenopathy.  Neurological:     Mental Status: She is alert and oriented to person, place, and time.     Results for orders placed or performed in visit on 11/12/22  POCT rapid strep A  Result Value Ref Range   Rapid Strep A Screen Negative Negative      Assessment & Plan:    Problem List Items Addressed This Visit   None Visit Diagnoses     Acute pharyngitis, unspecified etiology    -  Primary   Relevant Orders   POCT rapid strep A (Completed)   POC COVID-19   Acute bronchitis, unspecified organism       Relevant Medications   albuterol (PROVENTIL) (2.5 MG/3ML) 0.083% nebulizer solution 2.5 mg   benzonatate (TESSALON) 200 MG capsule   albuterol (VENTOLIN HFA) 108 (90 Base) MCG/ACT inhaler   Other Relevant Orders   POC COVID-19   Colon cancer screening       Relevant Orders   Cologuard     Patient instructions: Encourage adequate oral hydration. Use claritin or zyrtec 1tab and bedtime x 7week Use mucinex DM or Robitussin  or delsym for cough.  You can use plain "Tylenol" or "Advil" for fever, chills and achyness. Use cool mist humidifier at bedtime to help with nasal congestion and cough.  Cold/cough medications may have tylenol or  ibuprofen or guaifenesin or dextromethophan in them, so be careful not to take beyond the recommended dose for each of these medications.   "Common cold" symptoms are usually triggered by a virus.  The antibiotics are usually not necessary. On average, a" viral cold" illness may take 7-10 days to resolve. Please, make an appointment if you are not better or if you're worse.   Return in about 3 months (around 02/11/2023) for CPE (fasting).     Alysia Penna, NP

## 2022-11-14 ENCOUNTER — Telehealth: Payer: Self-pay

## 2022-11-14 NOTE — Telephone Encounter (Signed)
Called patient to see how she is feeling since her visit and the cough has not gotten better.  She will take another breathing treatment and if she's not better by Monday, she will make an appointment to see [facility].

## 2022-11-21 DIAGNOSIS — L820 Inflamed seborrheic keratosis: Secondary | ICD-10-CM | POA: Diagnosis not present

## 2022-11-21 NOTE — Telephone Encounter (Signed)
Pt called in stating she didn't feel any better. I called her back asking her to give [place] a cb to schedule a f/up appt with Claris Gower.

## 2022-11-22 ENCOUNTER — Ambulatory Visit: Payer: BC Managed Care – PPO | Admitting: Nurse Practitioner

## 2022-11-22 ENCOUNTER — Encounter: Payer: Self-pay | Admitting: Nurse Practitioner

## 2022-11-22 VITALS — BP 100/62 | HR 99 | Temp 98.8°F | Resp 16 | Ht 67.0 in | Wt 165.2 lb

## 2022-11-22 DIAGNOSIS — J4521 Mild intermittent asthma with (acute) exacerbation: Secondary | ICD-10-CM | POA: Diagnosis not present

## 2022-11-22 DIAGNOSIS — Z8481 Family history of carrier of genetic disease: Secondary | ICD-10-CM | POA: Insufficient documentation

## 2022-11-22 DIAGNOSIS — K219 Gastro-esophageal reflux disease without esophagitis: Secondary | ICD-10-CM | POA: Diagnosis not present

## 2022-11-22 MED ORDER — ALBUTEROL SULFATE (2.5 MG/3ML) 0.083% IN NEBU
2.5000 mg | INHALATION_SOLUTION | Freq: Once | RESPIRATORY_TRACT | Status: AC
Start: 2022-11-22 — End: 2022-11-22
  Administered 2022-11-22: 2.5 mg via RESPIRATORY_TRACT

## 2022-11-22 MED ORDER — ATROVENT HFA 17 MCG/ACT IN AERS
1.0000 | INHALATION_SPRAY | RESPIRATORY_TRACT | 0 refills | Status: DC | PRN
Start: 2022-11-22 — End: 2023-02-14

## 2022-11-22 MED ORDER — LEVOFLOXACIN 500 MG PO TABS
500.0000 mg | ORAL_TABLET | Freq: Every day | ORAL | 0 refills | Status: AC
Start: 2022-11-22 — End: 2022-11-29

## 2022-11-22 MED ORDER — SPACER/AERO-HOLDING CHAMBERS DEVI
1.0000 [IU] | Freq: Every day | 0 refills | Status: AC
Start: 2022-11-22 — End: ?

## 2022-11-22 MED ORDER — HYDROCODONE BIT-HOMATROP MBR 5-1.5 MG/5ML PO SOLN
5.0000 mL | Freq: Three times a day (TID) | ORAL | 0 refills | Status: DC | PRN
Start: 2022-11-22 — End: 2023-02-14

## 2022-11-22 NOTE — Assessment & Plan Note (Signed)
Symptoms controlled with avoiding coffee and chocolate. Denies need for omeprazole and pepcid

## 2022-11-22 NOTE — Assessment & Plan Note (Signed)
Persistent non productive cough, chest tightness and wheezing x 2weeks despite use of albuterol inhaler, benadryl and benzonatate. Associated with fatigue and hoarseness. No fever Unable to tolerate oral prednisone and ICS (Flovent used in past): rash and generalized itching. Spirometry completed 2023: moderate obstruction  Diminished lung sounds in bilateral lung bases prior to albuterol nebulizer treatment in office. Clear and equal lung sounds in all lung lobes post nebulizer treatment Sent Levaquin, atrovent HHA and hycodan Also provided an incentive spirometry and spacer. Advised to call office if she no improvement in 3days

## 2022-11-22 NOTE — Progress Notes (Signed)
Established Patient Visit  Patient: Wanda Cortez   DOB: 22-Dec-1970   52 y.o. Female  MRN: [id number] Visit Date: 11/22/2022  Subjective:    Chief Complaint  Patient presents with   Follow-up    Cough-  Pt is still not feeling better.  Still coughing, Her back is starting to hurt and is fatigue.    HPI GERD (gastroesophageal reflux disease) Symptoms controlled with avoiding coffee and chocolate. Denies need for omeprazole and pepcid  Asthma Persistent non productive cough, chest tightness and wheezing x 2weeks despite use of albuterol inhaler, benadryl and benzonatate. Associated with fatigue and hoarseness. No fever Unable to tolerate oral prednisone and ICS (Flovent used in past): rash and generalized itching. Spirometry completed 2023: moderate obstruction  Diminished lung sounds in bilateral lung bases prior to albuterol nebulizer treatment in office. Clear and equal lung sounds in all lung lobes post nebulizer treatment Sent Levaquin, atrovent HHA and hycodan Also provided an incentive spirometry and spacer. Advised to call office if she no improvement in 3days  History of cervical cancer S/p Leep procedure Followed by [facility] Last PAP 09/21/2021: normal with negative HPV Last mammogram 03/2022: normal  Reviewed medical, surgical, and social history today  Medications: Outpatient Medications Prior to Visit  Medication Sig   acetaminophen (TYLENOL) 325 MG tablet Take 2 tablets (650 mg total) by mouth every 6 (six) hours as needed for up to 30 doses for mild pain or moderate pain.   albuterol (VENTOLIN HFA) 108 (90 Base) MCG/ACT inhaler Inhale 1-2 puffs into the lungs every 6 (six) hours as needed for wheezing or shortness of breath.   Ascorbic Acid (VITAMIN C) 100 MG CHEW Vitamin C   B Complex Vitamins (VITAMIN B COMPLEX) TABS Take by mouth.   benzonatate (TESSALON) 200 MG capsule Take 1 capsule (200 mg total) by mouth 3 (three) times daily as  needed.   BIOTIN PO Take 1,250 mcg by mouth daily.   BLACK COHOSH-DONG QUAI PO Take by mouth 2 times daily at 12 noon and 4 pm.   conjugated estrogens (PREMARIN) vaginal cream Premarin 0.625 mg/gram vaginal cream  INSERT HALF AN APPLICATORFUL (1 GRAM) VAGINALLY EVERY DAY FOR 14 DAYS   diphenhydrAMINE (BENADRYL) 25 mg capsule Take 25 mg by mouth 3 (three) times a week.   EPINEPHrine 0.3 mg/0.3 mL IJ SOAJ injection epinephrine 0.3 mg/0.3 mL injection, auto-injector   Ferrous Sulfate (IRON) 325 (65 Fe) MG TABS 1 tablet Orally Three times a Week   gabapentin (NEURONTIN) 300 MG capsule Take 1 capsule (300 mg total) by mouth 3 (three) times daily.   MULTIPLE VITAMINS-MINERALS ER PO Take by mouth.   nitroGLYCERIN (NITRODUR - DOSED IN MG/24 HR) 0.2 mg/hr patch Cut and apply 1/4 patch to most painful area q24h.   progesterone (PROMETRIUM) 100 MG capsule Take 100 mg by mouth daily.   Specialty Vitamins Products (ADRENAL STRESS CALM PO) Take by mouth daily.   triamcinolone cream (KENALOG) 0.1 % Apply 1 application. topically 2 (two) times daily.   No facility-administered medications prior to visit.   Reviewed past medical and social history.   ROS per HPI above      Objective:  BP 100/62 (BP Location: Right Arm, Patient Position: Sitting, Cuff Size: Large)   Pulse 99   Temp 98.8 F (37.1 C) (Oral)   Resp 16   Ht 5\' 7"  (1.702 m)   Wt 165  lb 3.2 oz (74.9 kg)   LMP 09/01/2020   SpO2 99%   BMI 25.87 kg/m      Physical Exam Vitals reviewed.  Constitutional:      General: She is not in acute distress.    Appearance: She is ill-appearing.     Comments: Sounds hoarse  Cardiovascular:     Rate and Rhythm: Normal rate and regular rhythm.     Pulses: Normal pulses.     Heart sounds: Normal heart sounds.  Pulmonary:     Effort: Pulmonary effort is normal.     Breath sounds: Normal breath sounds.  Neurological:     Mental Status: She is alert and oriented to person, place, and time.      No results found for any visits on 11/22/22.    Assessment & Plan:    Problem List Items Addressed This Visit       Respiratory   Asthma - Primary    Persistent non productive cough, chest tightness and wheezing x 2weeks despite use of albuterol inhaler, benadryl and benzonatate. Associated with fatigue and hoarseness. No fever Unable to tolerate oral prednisone and ICS (Flovent used in past): rash and generalized itching. Spirometry completed 2023: moderate obstruction  Diminished lung sounds in bilateral lung bases prior to albuterol nebulizer treatment in office. Clear and equal lung sounds in all lung lobes post nebulizer treatment Sent Levaquin, atrovent HHA and hycodan Also provided an incentive spirometry and spacer. Advised to call office if she no improvement in 3days      Relevant Medications   levofloxacin (LEVAQUIN) 500 MG tablet   HYDROcodone bit-homatropine (HYCODAN) 5-1.5 MG/5ML syrup   ipratropium (ATROVENT HFA) 17 MCG/ACT inhaler   Spacer/Aero-Holding Chambers DEVI     Digestive   GERD (gastroesophageal reflux disease)    Symptoms controlled with avoiding coffee and chocolate. Denies need for omeprazole and pepcid      Return in about 4 weeks (around 12/20/2022) for CPE (fasting).     Alysia Penna, NP

## 2022-11-22 NOTE — Patient Instructions (Addendum)
Use albuterol nebulizer treatment in Am and PM x 3days, then as needed Use atrovent inhaler in AM, noon and PM (after albuterol nebulaizer treatment) x 3days, then as needed. Use spacer on inhaler canister. Start levaquin x 7days Alternate between benzonatate and hycodan for cough.

## 2022-11-22 NOTE — Assessment & Plan Note (Addendum)
S/p Leep procedure Followed by [facility] Last PAP 09/21/2021: normal with negative HPV Last mammogram 03/2022: normal

## 2022-11-22 NOTE — Progress Notes (Signed)
Pt was given an Albuterol nebulizer treatment in office for chronic cough.  Pt tolerated well

## 2023-01-02 DIAGNOSIS — I6523 Occlusion and stenosis of bilateral carotid arteries: Secondary | ICD-10-CM | POA: Diagnosis not present

## 2023-01-29 ENCOUNTER — Other Ambulatory Visit: Payer: Self-pay | Admitting: Family

## 2023-01-29 ENCOUNTER — Encounter: Payer: Self-pay | Admitting: Nurse Practitioner

## 2023-01-29 MED ORDER — MONTELUKAST SODIUM 10 MG PO TABS
10.0000 mg | ORAL_TABLET | Freq: Every day | ORAL | 3 refills | Status: AC
Start: 2023-01-29 — End: ?

## 2023-01-30 ENCOUNTER — Other Ambulatory Visit: Payer: Self-pay | Admitting: Nurse Practitioner

## 2023-01-30 DIAGNOSIS — J209 Acute bronchitis, unspecified: Secondary | ICD-10-CM

## 2023-02-14 ENCOUNTER — Encounter: Payer: Self-pay | Admitting: Nurse Practitioner

## 2023-02-14 ENCOUNTER — Ambulatory Visit (INDEPENDENT_AMBULATORY_CARE_PROVIDER_SITE_OTHER): Payer: BC Managed Care – PPO | Admitting: Nurse Practitioner

## 2023-02-14 VITALS — BP 112/72 | HR 80 | Temp 97.6°F | Resp 16 | Ht 67.0 in | Wt 163.0 lb

## 2023-02-14 DIAGNOSIS — Z13220 Encounter for screening for lipoid disorders: Secondary | ICD-10-CM | POA: Diagnosis not present

## 2023-02-14 DIAGNOSIS — Z136 Encounter for screening for cardiovascular disorders: Secondary | ICD-10-CM | POA: Diagnosis not present

## 2023-02-14 DIAGNOSIS — Z0000 Encounter for general adult medical examination without abnormal findings: Secondary | ICD-10-CM | POA: Diagnosis not present

## 2023-02-14 LAB — LIPID PANEL
Cholesterol: 229 mg/dL — ABNORMAL HIGH (ref 0–200)
HDL: 59.2 mg/dL (ref >39.00)
LDL Cholesterol: 147 mg/dL — ABNORMAL HIGH (ref 0–99)
NonHDL: 170.23
Total CHOL/HDL Ratio: 4
Triglycerides: 116 mg/dL (ref 0.0–149.0)
VLDL: 23.2 mg/dL (ref 0.0–40.0)

## 2023-02-14 LAB — COMPREHENSIVE METABOLIC PANEL
ALT: 15 U/L (ref 0–35)
AST: 15 U/L (ref 0–37)
Albumin: 4.5 g/dL (ref 3.5–5.2)
Alkaline Phosphatase: 61 U/L (ref 39–117)
BUN: 15 mg/dL (ref 6–23)
CO2: 29 mEq/L (ref 19–32)
Calcium: 10 mg/dL (ref 8.4–10.5)
Chloride: 104 mEq/L (ref 96–112)
Creatinine, Ser: 0.65 mg/dL (ref 0.40–1.20)
GFR: 101.13 mL/min (ref >60.00)
Glucose, Bld: 93 mg/dL (ref 70–99)
Potassium: 5.1 mEq/L (ref 3.5–5.1)
Sodium: 142 mEq/L (ref 135–145)
Total Bilirubin: 0.6 mg/dL (ref 0.2–1.2)
Total Protein: 7.2 g/dL (ref 6.0–8.3)

## 2023-02-14 LAB — TSH: TSH: 1.49 u[IU]/mL (ref 0.35–5.50)

## 2023-02-14 NOTE — Progress Notes (Signed)
Complete physical exam  Patient: Wanda Cortez   DOB: [birth year]   52 y.o. Female  MRN: [id number] Visit Date: 02/14/2023  Subjective:    Chief Complaint  Patient presents with   Annual Exam    Fasting  Declines Tdap and had pap in march   Wanda Cortez is a 52 y.o. female who presents today for a complete physical exam. She reports consuming a pestecarian diet. Cardio and strength exercise 4x/week She generally feels well. She reports sleeping well. She does not have additional problems to discuss today.  Vision:Yes Dental:Yes STD Screen:No She plans to complete cologuard. She has kit at home.  BP Readings from Last 3 Encounters:  02/14/23 112/72  11/22/22 100/62  11/12/22 100/80   Wt Readings from Last 3 Encounters:  02/14/23 163 lb (73.9 kg)  11/22/22 165 lb 3.2 oz (74.9 kg)  11/12/22 162 lb 3.2 oz (73.6 kg)   Most recent fall risk assessment:    11/12/2022    1:19 PM  Fall Risk   Falls in the past year? 0  Number falls in past yr: 0  Injury with Fall? 0  Risk for fall due to : No Fall Risks   Depression screen:Yes - No Depression  Most recent depression screenings:    11/12/2022    1:32 PM 11/12/2022    1:19 PM  PHQ 2/9 Scores  PHQ - 2 Score 0 0   HPI  She reports weight gain and insomnia Gained 10lbs in last 12months despite pescaterian diet-1400Kcal daily and of moderate intensity exercise weekly Disrupted sleep in last 4months. Stopped vaginal estrogen supplement  fall 2023. Stopped progesterone 3months ago Started magnesium supplement 4days ago with minimal improvement. No problem-specific Assessment & Plan notes found for this encounter.  Past Medical History:  Diagnosis Date   Asthma    Bronchitis    Pleurisy    Recurrent upper respiratory infection (URI)    Urticaria    Past Surgical History:  Procedure Laterality Date   APPENDECTOMY     CERVICAL BIOPSY  W/ LOOP ELECTRODE EXCISION     KNEE SURGERY Right 2018   Social History    Socioeconomic History   Marital status: Married    Spouse name: Not on file   Number of children: Not on file   Years of education: Not on file   Highest education level: Not on file  Occupational History   Not on file  Tobacco Use   Smoking status: Never   Smokeless tobacco: Never  Vaping Use   Vaping status: Never Used  Substance and Sexual Activity   Alcohol use: No   Drug use: No   Sexual activity: Yes    Birth control/protection: None    Comment: husband with vasectomy  Other Topics Concern   Not on file  Social History Narrative   Not on file   Social Determinants of Health   Financial Resource Strain: Not on file  Food Insecurity: Not on file  Transportation Needs: Not on file  Physical Activity: Not on file  Stress: Not on file  Social Connections: Not on file  Intimate Partner Violence: Not on file   Family Status  Relation Name Status   Mother  Alive   Father  Deceased   Sister  Alive   Sister  Alive   MGM  (Not Specified)   PGM  (Not Specified)   PGF  (Not Specified)   Interior and spatial designer  Alive   Niece  Alive  No partnership data on file   Family History  Problem Relation Age of Onset   Asthma Mother    Allergic rhinitis Mother    Breast cancer Mother        47s   Allergic rhinitis Father    Stroke Father    Hypertension Father    ALS Father 24       negative genetic test   Allergic rhinitis Sister    Arthritis Sister    Allergic rhinitis Sister    Stroke Maternal Grandmother    Uterine cancer Maternal Grandmother    Osteoporosis Maternal Grandmother    Heart disease Paternal Grandmother    Stroke Paternal Grandfather    Heart disease Paternal Grandfather    Allergic rhinitis Nephew    Allergic rhinitis Niece    Allergies  Allergen Reactions   Loratadine Palpitations   Meloxicam Anaphylaxis   Other Shortness Of Breath and Cough    Eggs, almonds, gluten   Wasp Venom Itching and Hives   Yellow Jacket Venom Anaphylaxis   Lorazepam Nausea  And Vomiting   Prednisone Rash    Patient Care Team: Takhia Spoon, Bonna Gains, NP as PCP - General (Internal Medicine)   Medications: Outpatient Medications Prior to Visit  Medication Sig   albuterol (VENTOLIN HFA) 108 (90 Base) MCG/ACT inhaler INHALE ONE TO TWO PUFFS BY MOUTH EVERY 6 HOURS AS NEEDED FOR SHORTNESS OF BREATH OR FOR WHEEZING   Ascorbic Acid (VITAMIN C) 100 MG CHEW Vitamin C   B Complex Vitamins (VITAMIN B COMPLEX) TABS Take by mouth.   BIOTIN PO Take 1,250 mcg by mouth daily.   BLACK COHOSH-DONG QUAI PO Take by mouth 2 times daily at 12 noon and 4 pm.   diphenhydrAMINE (BENADRYL) 25 mg capsule Take 25 mg by mouth 3 (three) times a week.   EPINEPHrine 0.3 mg/0.3 mL IJ SOAJ injection epinephrine 0.3 mg/0.3 mL injection, auto-injector   montelukast (SINGULAIR) 10 MG tablet Take 1 tablet (10 mg total) by mouth at bedtime.   MULTIPLE VITAMINS-MINERALS ER PO Take by mouth.   Spacer/Aero-Holding Chambers DEVI 1 Units by Does not apply route daily at 12 noon.   [DISCONTINUED] ipratropium (ATROVENT HFA) 17 MCG/ACT inhaler Inhale 1 puff into the lungs every 4 (four) hours as needed for wheezing.   [DISCONTINUED] acetaminophen (TYLENOL) 325 MG tablet Take 2 tablets (650 mg total) by mouth every 6 (six) hours as needed for up to 30 doses for mild pain or moderate pain.   [DISCONTINUED] benzonatate (TESSALON) 200 MG capsule Take 1 capsule (200 mg total) by mouth 3 (three) times daily as needed.   [DISCONTINUED] conjugated estrogens (PREMARIN) vaginal cream Premarin 0.625 mg/gram vaginal cream  INSERT HALF AN APPLICATORFUL (1 GRAM) VAGINALLY EVERY DAY FOR 14 DAYS   [DISCONTINUED] Ferrous Sulfate (IRON) 325 (65 Fe) MG TABS 1 tablet Orally Three times a Week   [DISCONTINUED] gabapentin (NEURONTIN) 300 MG capsule Take 1 capsule (300 mg total) by mouth 3 (three) times daily.   [DISCONTINUED] HYDROcodone bit-homatropine (HYCODAN) 5-1.5 MG/5ML syrup Take 5 mLs by mouth every 8 (eight) hours as needed  for cough.   [DISCONTINUED] nitroGLYCERIN (NITRODUR - DOSED IN MG/24 HR) 0.2 mg/hr patch Cut and apply 1/4 patch to most painful area q24h.   [DISCONTINUED] progesterone (PROMETRIUM) 100 MG capsule Take 100 mg by mouth daily.   [DISCONTINUED] Specialty Vitamins Products (ADRENAL STRESS CALM PO) Take by mouth daily.   [DISCONTINUED] triamcinolone cream (KENALOG) 0.1 % Apply 1 application. topically 2 (two)  times daily.   No facility-administered medications prior to visit.   Review of Systems  Constitutional:  Negative for activity change, appetite change and unexpected weight change.  Respiratory: Negative.    Cardiovascular: Negative.   Gastrointestinal: Negative.   Endocrine: Negative for cold intolerance and heat intolerance.  Genitourinary: Negative.   Musculoskeletal: Negative.   Skin: Negative.   Neurological: Negative.   Hematological: Negative.   Psychiatric/Behavioral:  Negative for behavioral problems, decreased concentration, dysphoric mood, hallucinations, self-injury, sleep disturbance and suicidal ideas. The patient is not nervous/anxious.        Objective:  BP 112/72 (BP Location: Left Arm, Patient Position: Sitting, Cuff Size: Normal)   Pulse 80   Temp 97.6 F (36.4 C) (Temporal)   Resp 16   Ht 5\' 7"  (1.702 m)   Wt 163 lb (73.9 kg)   LMP 09/01/2020   SpO2 98%   BMI 25.53 kg/m     Physical Exam Vitals and nursing note reviewed.  Constitutional:      General: She is not in acute distress. HENT:     Right Ear: Tympanic membrane, ear canal and external ear normal.     Left Ear: Tympanic membrane, ear canal and external ear normal.     Nose: Nose normal.  Eyes:     Extraocular Movements: Extraocular movements intact.     Conjunctiva/sclera: Conjunctivae normal.     Pupils: Pupils are equal, round, and reactive to light.  Neck:     Thyroid: No thyroid mass, thyromegaly or thyroid tenderness.  Cardiovascular:     Rate and Rhythm: Normal rate and regular  rhythm.     Pulses: Normal pulses.     Heart sounds: Normal heart sounds.  Pulmonary:     Effort: Pulmonary effort is normal.     Breath sounds: Normal breath sounds.  Abdominal:     General: Bowel sounds are normal.     Palpations: Abdomen is soft.  Musculoskeletal:        General: Normal range of motion.     Cervical back: Normal range of motion and neck supple.     Right lower leg: No edema.     Left lower leg: No edema.  Lymphadenopathy:     Cervical: No cervical adenopathy.  Skin:    General: Skin is warm and dry.  Neurological:     Mental Status: She is alert and oriented to person, place, and time.     Cranial Nerves: No cranial nerve deficit.  Psychiatric:        Mood and Affect: Mood normal.        Behavior: Behavior normal.        Thought Content: Thought content normal.      Results for orders placed or performed in visit on 02/14/23  Comprehensive metabolic panel  Result Value Ref Range   Sodium 142 135 - 145 mEq/L   Potassium 5.1 3.5 - 5.1 mEq/L   Chloride 104 96 - 112 mEq/L   CO2 29 19 - 32 mEq/L   Glucose, Bld 93 70 - 99 mg/dL   BUN 15 6 - 23 mg/dL   Creatinine, Ser 7.82 0.40 - 1.20 mg/dL   Total Bilirubin 0.6 0.2 - 1.2 mg/dL   Alkaline Phosphatase 61 39 - 117 U/L   AST 15 0 - 37 U/L   ALT 15 0 - 35 U/L   Total Protein 7.2 6.0 - 8.3 g/dL   Albumin 4.5 3.5 - 5.2 g/dL   GFR 956.21 >  60.00 mL/min   Calcium 10.0 8.4 - 10.5 mg/dL  TSH  Result Value Ref Range   TSH 1.49 0.35 - 5.50 uIU/mL  Lipid panel  Result Value Ref Range   Cholesterol 229 (H) 0 - 200 mg/dL   Triglycerides 295.6 0.0 - 149.0 mg/dL   HDL 21.30 >86.57 mg/dL   VLDL 84.6 0.0 - 96.2 mg/dL   LDL Cholesterol 952 (H) 0 - 99 mg/dL   Total CHOL/HDL Ratio 4    NonHDL 170.23       Assessment & Plan:    Routine Health Maintenance and Physical Exam  Immunization History  Administered Date(s) Administered   PFIZER(Purple Top)SARS-COV-2 Vaccination 08/10/2019, 09/07/2019, 04/21/2020,  12/15/2020   PPD Test 04/13/2015   Health Maintenance  Topic Date Due   Colonoscopy  Never done   Zoster Vaccines- Shingrix (1 of 2) 05/17/2023 (Originally 07/23/2020)   Hepatitis C Screening  02/14/2024 (Originally 07/23/1988)   HIV Screening  02/14/2024 (Originally 07/23/1985)   INFLUENZA VACCINE  02/20/2023   MAMMOGRAM  03/14/2024   PAP SMEAR-Modifier  10/02/2025   HPV VACCINES  Aged Out   DTaP/Tdap/Td  Discontinued   COVID-19 Vaccine  Discontinued   Discussed health benefits of physical activity, and encouraged her to engage in regular exercise appropriate for her age and condition. Advised to Schedule appointment with nutritionist and try Ahwaghanda for sleep  Problem List Items Addressed This Visit   None Visit Diagnoses     Preventative health care    -  Primary   Relevant Orders   Comprehensive metabolic panel (Completed)   TSH (Completed)   Lipid panel (Completed)   Encounter for lipid screening for cardiovascular disease       Relevant Orders   Lipid panel (Completed)      Return in about 1 year (around 02/14/2024) for CPE (fasting).     Alysia Penna, NP

## 2023-02-14 NOTE — Patient Instructions (Addendum)
Go to lab Complete cologuard Continue Heart healthy diet and daily exercise. Maintain current medications. Try ashwaghanda for sleep  Preventive Care 79-52 Years Old, Female Preventive care refers to lifestyle choices and visits with your health care provider that can promote health and wellness. Preventive care visits are also called wellness exams. What can I expect for my preventive care visit? Counseling Your health care provider may ask you questions about your: Medical history, including: Past medical problems. Family medical history. Pregnancy history. Current health, including: Menstrual cycle. Method of birth control. Emotional well-being. Home life and relationship well-being. Sexual activity and sexual health. Lifestyle, including: Alcohol, nicotine or tobacco, and drug use. Access to firearms. Diet, exercise, and sleep habits. Work and work Astronomer. Sunscreen use. Safety issues such as seatbelt and bike helmet use. Physical exam Your health care provider will check your: Height and weight. These may be used to calculate your BMI (body mass index). BMI is a measurement that tells if you are at a healthy weight. Waist circumference. This measures the distance around your waistline. This measurement also tells if you are at a healthy weight and may help predict your risk of certain diseases, such as type 2 diabetes and high blood pressure. Heart rate and blood pressure. Body temperature. Skin for abnormal spots. What immunizations do I need?  Vaccines are usually given at various ages, according to a schedule. Your health care provider will recommend vaccines for you based on your age, medical history, and lifestyle or other factors, such as travel or where you work. What tests do I need? Screening Your health care provider may recommend screening tests for certain conditions. This may include: Lipid and cholesterol levels. Diabetes screening. This is done by  checking your blood sugar (glucose) after you have not eaten for a while (fasting). Pelvic exam and Pap test. Hepatitis B test. Hepatitis C test. HIV (human immunodeficiency virus) test. STI (sexually transmitted infection) testing, if you are at risk. Lung cancer screening. Colorectal cancer screening. Mammogram. Talk with your health care provider about when you should start having regular mammograms. This may depend on whether you have a family history of breast cancer. BRCA-related cancer screening. This may be done if you have a family history of breast, ovarian, tubal, or peritoneal cancers. Bone density scan. This is done to screen for osteoporosis. Talk with your health care provider about your test results, treatment options, and if necessary, the need for more tests. Follow these instructions at home: Eating and drinking  Eat a diet that includes fresh fruits and vegetables, whole grains, lean protein, and low-fat dairy products. Take vitamin and mineral supplements as recommended by your health care provider. Do not drink alcohol if: Your health care provider tells you not to drink. You are pregnant, may be pregnant, or are planning to become pregnant. If you drink alcohol: Limit how much you have to 0-1 drink a day. Know how much alcohol is in your drink. In the [place] one drink equals one 12 oz bottle of beer (355 mL), one 5 oz glass of wine (148 mL), or one 1 oz glass of hard liquor (44 mL). Lifestyle Brush your teeth every morning and night with fluoride toothpaste. Floss one time each day. Exercise for at least 30 minutes 5 or more days each week. Do not use any products that contain nicotine or tobacco. These products include cigarettes, chewing tobacco, and vaping devices, such as e-cigarettes. If you need help quitting, ask your health care provider. Do  not use drugs. If you are sexually active, practice safe sex. Use a condom or other form of protection to prevent  STIs. If you do not wish to become pregnant, use a form of birth control. If you plan to become pregnant, see your health care provider for a prepregnancy visit. Take aspirin only as told by your health care provider. Make sure that you understand how much to take and what form to take. Work with your health care provider to find out whether it is safe and beneficial for you to take aspirin daily. Find healthy ways to manage stress, such as: Meditation, yoga, or listening to music. Journaling. Talking to a trusted person. Spending time with friends and family. Minimize exposure to UV radiation to reduce your risk of skin cancer. Safety Always wear your seat belt while driving or riding in a vehicle. Do not drive: If you have been drinking alcohol. Do not ride with someone who has been drinking. When you are tired or distracted. While texting. If you have been using any mind-altering substances or drugs. Wear a helmet and other protective equipment during sports activities. If you have firearms in your house, make sure you follow all gun safety procedures. Seek help if you have been physically or sexually abused. What's next? Visit your health care provider once a year for an annual wellness visit. Ask your health care provider how often you should have your eyes and teeth checked. Stay up to date on all vaccines. This information is not intended to replace advice given to you by your health care provider. Make sure you discuss any questions you have with your health care provider. Document Revised: 01/03/2021 Document Reviewed: 01/03/2021 Elsevier Patient Education  2024 [employer].

## 2023-02-17 DIAGNOSIS — Z1211 Encounter for screening for malignant neoplasm of colon: Secondary | ICD-10-CM | POA: Diagnosis not present

## 2023-02-20 DIAGNOSIS — Z124 Encounter for screening for malignant neoplasm of cervix: Secondary | ICD-10-CM | POA: Diagnosis not present

## 2023-02-22 LAB — COLOGUARD: COLOGUARD: NEGATIVE

## 2023-02-27 ENCOUNTER — Ambulatory Visit: Payer: BC Managed Care – PPO | Admitting: Nurse Practitioner

## 2023-02-27 ENCOUNTER — Encounter: Payer: Self-pay | Admitting: Nurse Practitioner

## 2023-02-27 VITALS — BP 131/75 | HR 74 | Temp 98.6°F | Ht 67.0 in | Wt 159.0 lb

## 2023-02-27 DIAGNOSIS — F418 Other specified anxiety disorders: Secondary | ICD-10-CM | POA: Diagnosis not present

## 2023-02-27 MED ORDER — CLONAZEPAM 0.25 MG PO TBDP
0.2500 mg | ORAL_TABLET | Freq: Every day | ORAL | 0 refills | Status: DC | PRN
Start: 2023-02-27 — End: 2023-05-02
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
## 2023-02-27 NOTE — Patient Instructions (Signed)
Clonazepam Disintegrating Tablets What is this medication? CLONAZEPAM (kloe NA ze pam) treats seizures. It may also be used to treat panic disorder. It works by Education administrator system calm down. It belongs to a group of medications called benzodiazepines. This medicine may be used for other purposes; ask your health care provider or pharmacist if you have questions. COMMON BRAND NAME(S): Klonopin What should I tell my care team before I take this medication? They need to know if you have any of these conditions: Bipolar disorder, depression, psychosis, or other mental health conditions Glaucoma Kidney disease Liver disease Lung or breathing disease Myasthenia gravis Parkinson disease Porphyria Seizures or a history of seizures Substance use disorder Suicidal thoughts, plans, or attempt by you or a family member An unusual or allergic reaction to clonazepam, other medications, foods, dyes, or preservatives Pregnant or trying to get pregnant Breastfeeding How should I use this medication? Take this medication by mouth. Take it as directed on the prescription label at the same time every day. Place the wafer on your tongue, and it will slowly dissolve. You do not need to swallow the wafer with water, but you may take it with water if you like. You can take it with or without food. If it upsets your stomach, take it with food. Do not take your medication more often than directed. Do not stop taking or change the dose except on the advice of your care team. A special MedGuide will be given to you by the pharmacist with each prescription and refill. Be sure to read this information carefully each time. Talk to your care team about the use of this medication in children. Special care may be needed. Overdosage: If you think you have taken too much of this medicine contact a poison control center or emergency room at once. NOTE: This medicine is only for you. Do not share this medicine with  others. What if I miss a dose? If you miss a dose, take it as soon as you can. If it is almost time for your next dose, take only that dose. Do not take double or extra doses. What may interact with this medication? Do not take this medication with any of the following: Opioid medications for cough Sodium oxybate This medication may also interact with the following: Alcohol Antihistamines for allergy, cough, and cold Antiviral medications for HIV or AIDS Certain medications for anxiety or sleep Certain medications for depression, such as amitriptyline, fluoxetine, sertraline Certain medications for fungal infections, such as ketoconazole and itraconazole Certain medications for seizures, such as carbamazepine, phenobarbital, phenytoin, primidone General anesthetics, such as halothane, isoflurane, methoxyflurane, propofol Local anesthetics, such as lidocaine, pramoxine, tetracaine Medications that relax muscles for surgery Opioid medications for pain Phenothiazines, such as chlorpromazine, mesoridazine, prochlorperazine, thioridazine This list may not describe all possible interactions. Give your health care provider a list of all the medicines, herbs, non-prescription drugs, or dietary supplements you use. Also tell them if you smoke, drink alcohol, or use illegal drugs. Some items may interact with your medicine. What should I watch for while using this medication? Visit your care team for regular checks on your progress. Tell your care team if your symptoms do not start to get better or if they get worse. Do not suddenly stop taking this medication. You may develop a severe reaction. Your care team will tell you how much medication to take. If your care team wants you to stop the medication, the dose may be slowly lowered over  time to avoid any side effects. This medication may affect your coordination, reaction time, or judgment. Do not drive or operate machinery until you know how this  medication affects you. Sit up or stand slowly to reduce the risk of dizzy or fainting spells. Drinking alcohol with this medication can increase the risk of these side effects. If you are taking another medication that also causes drowsiness, you may have more side effects. Give your care team a list of all medications you use. Your care team will tell you how much medication to take. Do not take more medication than directed. Get emergency help right away if you have problems breathing or unusual sleepiness. If you or your family notice any changes in your behavior, such as new or worsening depression, thoughts of harming yourself, anxiety, other unusual or disturbing thoughts, or memory loss, call your care team right away. Talk to your care team if you wish to become pregnant or think you might be pregnant. This medication can cause serious birth defects if taken during pregnancy. Talk to your care team before breastfeeding. Changes to your treatment plan may be needed. What side effects may I notice from receiving this medication? Side effects that you should report to your care team as soon as possible: Allergic reactions--skin rash, itching, hives, swelling of the face, lips, tongue, or throat CNS depression--slow or shallow breathing, shortness of breath, feeling faint, dizziness, confusion, trouble staying awake Thoughts of suicide or self-harm, worsening mood, feelings of depression Side effects that usually do not require medical attention (report to your care team if they continue or are bothersome): Dizziness Drowsiness Headache This list may not describe all possible side effects. Call your doctor for medical advice about side effects. You may report side effects to FDA at 1-800-FDA-1088. Where should I keep my medication? Keep out of the reach of children and pets. This medication can be abused. Keep your medication in a safe place to protect it from theft. Do not share this medication  with anyone. Selling or giving away this medication is dangerous and against the law. Store at room temperature between 15 and 30 degrees C (59 and 86 degrees F). Protect from light and moisture. Keep container tightly closed. NOTE: This sheet is a summary. It may not cover all possible information. If you have questions about this medicine, talk to your doctor, pharmacist, or health care provider.  2024 Elsevier/Gold Standard (2022-04-03 00:00:00)
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
## 2023-03-26 ENCOUNTER — Encounter: Payer: Self-pay | Admitting: Nurse Practitioner

## 2023-03-26 ENCOUNTER — Ambulatory Visit: Admission: RE | Admit: 2023-03-26 | Payer: BC Managed Care – PPO | Source: Ambulatory Visit

## 2023-03-26 ENCOUNTER — Ambulatory Visit: Payer: BC Managed Care – PPO | Admitting: Nurse Practitioner

## 2023-03-26 VITALS — BP 128/78 | HR 76 | Temp 98.3°F | Wt 163.4 lb

## 2023-03-26 DIAGNOSIS — R1012 Left upper quadrant pain: Secondary | ICD-10-CM

## 2023-03-26 DIAGNOSIS — R0781 Pleurodynia: Secondary | ICD-10-CM | POA: Insufficient documentation

## 2023-03-26 DIAGNOSIS — J45909 Unspecified asthma, uncomplicated: Secondary | ICD-10-CM | POA: Diagnosis not present

## 2023-03-26 LAB — CBC WITH DIFFERENTIAL/PLATELET
Basophils Absolute: 0 10*3/uL (ref 0.0–0.1)
Basophils Relative: 0.4 % (ref 0.0–3.0)
Eosinophils Absolute: 0.1 10*3/uL (ref 0.0–0.7)
Eosinophils Relative: 1 % (ref 0.0–5.0)
HCT: 41 % (ref 36.0–46.0)
Hemoglobin: 13.5 g/dL (ref 12.0–15.0)
Lymphocytes Relative: 26.1 % (ref 12.0–46.0)
Lymphs Abs: 1.6 10*3/uL (ref 0.7–4.0)
MCHC: 33 g/dL (ref 30.0–36.0)
MCV: 93.1 fl (ref 78.0–100.0)
Monocytes Absolute: 0.5 10*3/uL (ref 0.1–1.0)
Monocytes Relative: 7.7 % (ref 3.0–12.0)
Neutro Abs: 3.9 10*3/uL (ref 1.4–7.7)
Neutrophils Relative %: 64.8 % (ref 43.0–77.0)
Platelets: 280 10*3/uL (ref 150.0–400.0)
RBC: 4.4 Mil/uL (ref 3.87–5.11)
RDW: 12.5 % (ref 11.5–15.5)
WBC: 6 10*3/uL (ref 4.0–10.5)

## 2023-03-26 LAB — HEPATIC FUNCTION PANEL
ALT: 14 U/L (ref 0–35)
AST: 15 U/L (ref 0–37)
Albumin: 4.3 g/dL (ref 3.5–5.2)
Alkaline Phosphatase: 69 U/L (ref 39–117)
Bilirubin, Direct: 0.1 mg/dL (ref 0.0–0.3)
Total Bilirubin: 0.4 mg/dL (ref 0.2–1.2)
Total Protein: 7.2 g/dL (ref 6.0–8.3)

## 2023-03-26 LAB — LIPASE: Lipase: 37 U/L (ref 11.0–59.0)

## 2023-03-26 NOTE — Progress Notes (Signed)
Established Patient Visit  Patient: Wanda Cortez   DOB: Feb 08, 1971   52 y.o. Female  MRN: [id number] Visit Date: 03/26/2023  Subjective:    Chief Complaint  Patient presents with   Rib Injury    Fractured ribs Nov 24 2021. Possible refracture within the last 10 days. Monday night worsening pain PT has asthma and afraid injury might not allow for proper breathing. Requesting lungs to be checked and future bone density test.   Abdominal Pain This is a recurrent problem. The current episode started 1 to 4 weeks ago. The onset quality is gradual. The problem occurs constantly. The problem has been unchanged. The pain is located in the LUQ and left flank. The pain is moderate. The quality of the pain is aching. Pain radiation: left lower chest wall. Pertinent negatives include no anorexia, arthralgias, belching, constipation, diarrhea, dysuria, fever, flatus, frequency, headaches, hematochezia, hematuria, melena, myalgias, nausea, vomiting or weight loss. The pain is aggravated by certain positions, deep breathing, movement and palpation. The pain is relieved by Being still. She has tried acetaminophen (cold compress) for the symptoms. The treatment provided mild relief. There is no history of abdominal surgery, gallstones, GERD, pancreatitis, PUD or ulcerative colitis.  Onset after carrying 25lbs box and reaching overhead, Denies any blunt force trauma. Allergic reaction to NSAIDs. No rib or spine fracture noted on DG rib and no abnormality on CT ABDOMEN/pelvis 11/2021  No problem-specific Assessment & Plan notes found for this encounter.  Reviewed medical, surgical, and social history today  Medications: Outpatient Medications Prior to Visit  Medication Sig   albuterol (VENTOLIN HFA) 108 (90 Base) MCG/ACT inhaler INHALE ONE TO TWO PUFFS BY MOUTH EVERY 6 HOURS AS NEEDED FOR SHORTNESS OF BREATH OR FOR WHEEZING   Ascorbic Acid (VITAMIN C) 100 MG CHEW Vitamin C   B Complex Vitamins  (VITAMIN B COMPLEX) TABS Take by mouth.   BIOTIN PO Take 1,250 mcg by mouth daily.   BLACK COHOSH-DONG QUAI PO Take by mouth 2 times daily at 12 noon and 4 pm.   clonazePAM (KLONOPIN) 0.25 MG disintegrating tablet Take 1 tablet (0.25 mg total) by mouth daily as needed.   diphenhydrAMINE (BENADRYL) 25 mg capsule Take 25 mg by mouth 3 (three) times a week.   EPINEPHrine 0.3 mg/0.3 mL IJ SOAJ injection epinephrine 0.3 mg/0.3 mL injection, auto-injector   MULTIPLE VITAMINS-MINERALS ER PO Take by mouth.   Spacer/Aero-Holding Chambers DEVI 1 Units by Does not apply route daily at 12 noon.   montelukast (SINGULAIR) 10 MG tablet Take 1 tablet (10 mg total) by mouth at bedtime. (Patient not taking: Reported on 03/26/2023)   No facility-administered medications prior to visit.   Reviewed past medical and social history.   ROS per HPI above      Objective:  BP 128/78   Pulse 76   Temp 98.3 F (36.8 C) (Temporal)   Wt 163 lb 6.4 oz (74.1 kg)   LMP 09/01/2020   SpO2 99%   BMI 25.59 kg/m      Physical Exam Vitals and nursing note reviewed.  Cardiovascular:     Rate and Rhythm: Normal rate and regular rhythm.     Pulses: Normal pulses.     Heart sounds: Normal heart sounds.  Pulmonary:     Effort: Pulmonary effort is normal.     Breath sounds: Normal breath sounds.  Abdominal:     General: Bowel  sounds are normal. There is no distension.     Palpations: Abdomen is soft.     Tenderness: There is abdominal tenderness in the left upper quadrant. There is left CVA tenderness and guarding. There is no right CVA tenderness or rebound.     Hernia: No hernia is present.  Skin:    General: Skin is warm and dry.     Findings: No erythema or rash.  Neurological:     Mental Status: She is alert and oriented to person, place, and time.     No results found for any visits on 03/26/23.    Assessment & Plan:    Problem List Items Addressed This Visit     Anterior pleuritic pain   Other  Visit Diagnoses     LUQ abdominal pain    -  Primary   Relevant Orders   Lipase   Hepatic function panel   CBC with Differential/Platelet   DG Chest 2 View     Likely musculoskeletal strain. Complete above orders to ensure no pleurisy or pancreatitis. Advised to Continue tylenol 650mg  every 8hrs and cold compress prn.  Return if symptoms worsen or fail to improve.     Alysia Penna, NP

## 2023-03-26 NOTE — Patient Instructions (Signed)
Go to [address] for CXR Go to lab Continue tylenol 650mg  every 8hrs and cold compress prn.

## 2023-04-20 ENCOUNTER — Telehealth: Payer: BC Managed Care – PPO

## 2023-04-20 DIAGNOSIS — R10811 Right upper quadrant abdominal tenderness: Secondary | ICD-10-CM | POA: Diagnosis not present

## 2023-04-20 DIAGNOSIS — R079 Chest pain, unspecified: Secondary | ICD-10-CM | POA: Diagnosis not present

## 2023-04-20 DIAGNOSIS — R142 Eructation: Secondary | ICD-10-CM

## 2023-04-20 DIAGNOSIS — K2100 Gastro-esophageal reflux disease with esophagitis, without bleeding: Secondary | ICD-10-CM

## 2023-04-20 NOTE — Progress Notes (Signed)
Virtual Visit Consent   Wanda Cortez, you are scheduled for a virtual visit with a [facility] provider today. Just as with appointments in the office, your consent must be obtained to participate. Your consent will be active for this visit and any virtual visit you may have with one of our providers in the next 365 days. If you have a MyChart account, a copy of this consent can be sent to you electronically.  As this is a virtual visit, video technology does not allow for your provider to perform a traditional examination. This may limit your provider's ability to fully assess your condition. If your provider identifies any concerns that need to be evaluated in person or the need to arrange testing (such as labs, EKG, etc.), we will make arrangements to do so. Although advances in technology are sophisticated, we cannot ensure that it will always work on either your end or our end. If the connection with a video visit is poor, the visit may have to be switched to a telephone visit. With either a video or telephone visit, we are not always able to ensure that we have a secure connection.  By engaging in this virtual visit, you consent to the provision of healthcare and authorize for your insurance to be billed (if applicable) for the services provided during this visit. Depending on your insurance coverage, you may receive a charge related to this service.  I need to obtain your verbal consent now. Are you willing to proceed with your visit today? [name] has provided verbal consent on 04/20/2023 for a virtual visit (video or telephone). [name], FNP  Date: 04/20/2023 8:35 AM  Virtual Visit via Video Note   I, [name], connected with  Wanda Cortez  ([id number], [birth year]) on 04/20/23 at  8:30 AM EDT by a video-enabled telemedicine application and verified that I am speaking with the correct person using two identifiers.  Location: Patient: Virtual Visit Location Patient:  Home Provider: Virtual Visit Location Provider: Home Office   I discussed the limitations of evaluation and management by telemedicine and the availability of in person appointments. The patient expressed understanding and agreed to proceed.    History of Present Illness: Wanda Cortez is a 52 y.o. who identifies as a female who was assigned female at birth, and is being seen today for right chest pain and right lower abdominal pain that started 2:00 pm that started right after eating and lasted til 8:00 Pm. Denies any pain at this time. Reports burping and gas pain. She reports she had noticed chills. Tenderness in RUQ.    HPI: Gastroesophageal Reflux She complains of abdominal pain, chest pain and heartburn. She reports no early satiety. This is a new problem. The current episode started yesterday.    Problems:  Patient Active Problem List   Diagnosis Date Noted   Anterior pleuritic pain 03/26/2023   Situational anxiety 02/27/2023   GERD (gastroesophageal reflux disease) 11/22/2022   Family history of breast cancer gene mutation in first degree relative 11/22/2022   Family history of uterine cancer 10/03/2022   Tendinopathy of right rotator cuff 06/20/2022   Asthma 06/07/2021   Food allergy 06/07/2021   Shoulder effusion, right 03/21/2021   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/21/2021   Cervical radiculopathy 03/16/2021   Menopausal flushing 07/06/2020   Lipoma of left lower extremity 01/04/2020   History of cervical cancer 07/12/2019   Tear of right scapholunate ligament 03/30/2019   Essential  hypertension 02/25/2019   Chronic pain of right knee 09/22/2018   Acute lateral meniscus tear of left knee 06/10/2017   Submucous leiomyoma of uterus 03/21/2016   Uterine leiomyoma 03/21/2016   Allergy to insect stings 12/27/2014    Allergies:  Allergies  Allergen Reactions   Loratadine Palpitations   Meloxicam Anaphylaxis   Other Shortness Of Breath and Cough    Eggs, almonds,  gluten   Wasp Venom Itching and Hives   Yellow Jacket Venom Anaphylaxis   Lorazepam Nausea And Vomiting   Prednisone Rash   Medications:  Current Outpatient Medications:    albuterol (VENTOLIN HFA) 108 (90 Base) MCG/ACT inhaler, INHALE ONE TO TWO PUFFS BY MOUTH EVERY 6 HOURS AS NEEDED FOR SHORTNESS OF BREATH OR FOR WHEEZING, Disp: 8.5 g, Rfl: 0   Ascorbic Acid (VITAMIN C) 100 MG CHEW, Vitamin C, Disp: , Rfl:    B Complex Vitamins (VITAMIN B COMPLEX) TABS, Take by mouth., Disp: , Rfl:    BIOTIN PO, Take 1,250 mcg by mouth daily., Disp: , Rfl:    BLACK COHOSH-DONG QUAI PO, Take by mouth 2 times daily at 12 noon and 4 pm., Disp: , Rfl:    clonazePAM (KLONOPIN) 0.25 MG disintegrating tablet, Take 1 tablet (0.25 mg total) by mouth daily as needed., Disp: 4 tablet, Rfl: 0   diphenhydrAMINE (BENADRYL) 25 mg capsule, Take 25 mg by mouth 3 (three) times a week., Disp: , Rfl:    EPINEPHrine 0.3 mg/0.3 mL IJ SOAJ injection, epinephrine 0.3 mg/0.3 mL injection, auto-injector, Disp: 1 each, Rfl: 2   montelukast (SINGULAIR) 10 MG tablet, Take 1 tablet (10 mg total) by mouth at bedtime. (Patient not taking: Reported on 03/26/2023), Disp: 30 tablet, Rfl: 3   MULTIPLE VITAMINS-MINERALS ER PO, Take by mouth., Disp: , Rfl:    Spacer/Aero-Holding Chambers DEVI, 1 Units by Does not apply route daily at 12 noon., Disp: 1 Units, Rfl: 0  Observations/Objective: Patient is well-developed, well-nourished in no acute distress.  Resting comfortably  at home.  Head is normocephalic, atraumatic.  No labored breathing.  Speech is clear and coherent with logical content.  Patient is alert and oriented at baseline.  Mild Tenderness in RUQ  Assessment and Plan: 1. Chest pain, unspecified type  2. Gastroesophageal reflux disease with esophagitis without hemorrhage  3. Burping  4. Right upper quadrant abdominal tenderness, rebound tenderness presence not specified  Worrisome for cholecystitis Recommend to see  Urgent Care today or PCP Monday if symptoms resolve.  Avoid any fatty foods If fever or pain increases go to ED Pt can not tolerate PPI    Follow Up Instructions: I discussed the assessment and treatment plan with the patient. The patient was provided an opportunity to ask questions and all were answered. The patient agreed with the plan and demonstrated an understanding of the instructions.  A copy of instructions were sent to the patient via MyChart unless otherwise noted below.    The patient was advised to call back or seek an in-person evaluation if the symptoms worsen or if the condition fails to improve as anticipated.  Time:  I spent 9 minutes with the patient via telehealth technology discussing the above problems/concerns.    [name], FNP

## 2023-04-21 ENCOUNTER — Ambulatory Visit (INDEPENDENT_AMBULATORY_CARE_PROVIDER_SITE_OTHER): Payer: BC Managed Care – PPO | Admitting: Nurse Practitioner

## 2023-04-21 ENCOUNTER — Encounter: Payer: Self-pay | Admitting: Nurse Practitioner

## 2023-04-21 ENCOUNTER — Ambulatory Visit (HOSPITAL_BASED_OUTPATIENT_CLINIC_OR_DEPARTMENT_OTHER)
Admission: RE | Admit: 2023-04-21 | Discharge: 2023-04-21 | Disposition: A | Discharge status: Home / Self Care | Payer: BC Managed Care – PPO | Source: Ambulatory Visit | Attending: Nurse Practitioner | Admitting: Nurse Practitioner

## 2023-04-21 VITALS — BP 126/73 | HR 88 | Wt 160.0 lb

## 2023-04-21 DIAGNOSIS — K838 Other specified diseases of biliary tract: Secondary | ICD-10-CM

## 2023-04-21 DIAGNOSIS — R1011 Right upper quadrant pain: Secondary | ICD-10-CM

## 2023-04-21 LAB — BASIC METABOLIC PANEL
BUN: 13 mg/dL (ref 6–23)
CO2: 28 meq/L (ref 19–32)
Calcium: 10 mg/dL (ref 8.4–10.5)
Chloride: 104 meq/L (ref 96–112)
Creatinine, Ser: 0.65 mg/dL (ref 0.40–1.20)
GFR: 101 mL/min (ref >60.00)
Glucose, Bld: 99 mg/dL (ref 70–99)
Potassium: 4.1 meq/L (ref 3.5–5.1)
Sodium: 140 meq/L (ref 135–145)

## 2023-04-21 LAB — HEPATIC FUNCTION PANEL
ALT: 15 U/L (ref 0–35)
AST: 15 U/L (ref 0–37)
Albumin: 4.7 g/dL (ref 3.5–5.2)
Alkaline Phosphatase: 65 U/L (ref 39–117)
Bilirubin, Direct: 0.1 mg/dL (ref 0.0–0.3)
Total Bilirubin: 0.5 mg/dL (ref 0.2–1.2)
Total Protein: 7.4 g/dL (ref 6.0–8.3)

## 2023-04-21 LAB — AMYLASE: Amylase: 45 U/L (ref 27–131)

## 2023-04-21 LAB — LIPASE: Lipase: 26 U/L (ref 11.0–59.0)

## 2023-04-21 NOTE — Progress Notes (Signed)
Acute Office Visit  Subjective:    Patient ID: Wanda Cortez, female    DOB: 04-Jan-1971, 52 y.o.   MRN: [id number]  Chief Complaint  Patient presents with   Flank Pain    Started Saturday; Pt states after she eats she gets a severe pain in R side under rib cage. Pt cannot take NSAIDS, been applying heat compress and benadryl.    Abdominal Pain This is a new problem. The current episode started in the past 7 days. The onset quality is gradual. The problem occurs constantly. The pain is located in the epigastric region and RUQ. The pain is severe. The quality of the pain is colicky and a sensation of fullness. The abdominal pain radiates to the epigastric region, LUQ and chest. Associated symptoms include anorexia and nausea. Pertinent negatives include no constipation, diarrhea, dysuria, hematochezia, hematuria, melena, myalgias or vomiting. The pain is aggravated by eating, movement and deep breathing. The pain is relieved by Nothing. She has tried nothing for the symptoms. Her past medical history is significant for GERD. There is no history of gallstones.  ABDOMEN pain, fullness and nausea postprandial.  Outpatient Medications Prior to Visit  Medication Sig   albuterol (VENTOLIN HFA) 108 (90 Base) MCG/ACT inhaler INHALE ONE TO TWO PUFFS BY MOUTH EVERY 6 HOURS AS NEEDED FOR SHORTNESS OF BREATH OR FOR WHEEZING   Ascorbic Acid (VITAMIN C) 100 MG CHEW Vitamin C   B Complex Vitamins (VITAMIN B COMPLEX) TABS Take by mouth.   BIOTIN PO Take 1,250 mcg by mouth daily.   BLACK COHOSH-DONG QUAI PO Take by mouth 2 times daily at 12 noon and 4 pm.   clonazePAM (KLONOPIN) 0.25 MG disintegrating tablet Take 1 tablet (0.25 mg total) by mouth daily as needed.   diphenhydrAMINE (BENADRYL) 25 mg capsule Take 25 mg by mouth 3 (three) times a week.   EPINEPHrine 0.3 mg/0.3 mL IJ SOAJ injection epinephrine 0.3 mg/0.3 mL injection, auto-injector   MULTIPLE VITAMINS-MINERALS ER PO Take by mouth.    Spacer/Aero-Holding Chambers DEVI 1 Units by Does not apply route daily at 12 noon.   montelukast (SINGULAIR) 10 MG tablet Take 1 tablet (10 mg total) by mouth at bedtime. (Patient not taking: Reported on 03/26/2023)   No facility-administered medications prior to visit.   Reviewed past medical and social history.  Review of Systems  Gastrointestinal:  Positive for abdominal pain, anorexia and nausea. Negative for constipation, diarrhea, hematochezia, melena and vomiting.  Genitourinary:  Negative for dysuria and hematuria.  Musculoskeletal:  Negative for myalgias.   Per HPI     Objective:    Physical Exam Vitals and nursing note reviewed.  Constitutional:      General: She is not in acute distress. Cardiovascular:     Rate and Rhythm: Normal rate.     Pulses: Normal pulses.     Heart sounds: Normal heart sounds.  Pulmonary:     Effort: Pulmonary effort is normal.     Breath sounds: Normal breath sounds.  Abdominal:     General: There is no distension.     Palpations: Abdomen is soft. There is no mass.     Tenderness: There is abdominal tenderness. There is guarding. There is no right CVA tenderness or left CVA tenderness.  Neurological:     Mental Status: She is alert and oriented to person, place, and time.    BP 126/73   Pulse 88   Wt 160 lb (72.6 kg)   LMP 09/01/2020  SpO2 100%   BMI 25.06 kg/m    No results found for any visits on 04/21/23.     Assessment & Plan:   Problem List Items Addressed This Visit   None Visit Diagnoses     RUQ abdominal pain    -  Primary   Relevant Orders   Hepatic function panel   Lipase   US Abdomen Limited RUQ (LIVER/GB)   Basic metabolic panel   Amylase      Scheduled for ABDOMEN US today at 6pm Maintain Clear liquid diet after [place] Go to ED if symptoms worsen  No orders of the defined types were placed in this encounter.  Return if symptoms worsen or fail to improve.    Alysia Penna, NP

## 2023-04-21 NOTE — Patient Instructions (Addendum)
You have an appointment for ABDOMEN US today at 5pm Go to [facility] on [address], [facility] to drink water, but nothing else to eat till [place] is completed Maintain Clear liquid diet after [place] Go to ED if symptoms worsen Go to lab

## 2023-04-22 ENCOUNTER — Other Ambulatory Visit: Payer: Self-pay | Admitting: Nurse Practitioner

## 2023-04-22 ENCOUNTER — Telehealth: Payer: Self-pay | Admitting: Nurse Practitioner

## 2023-04-22 NOTE — Telephone Encounter (Signed)
Pt advised of appt and arrival time. Office notes, labs and ultrasound faxed to [facility].

## 2023-04-22 NOTE — Addendum Note (Signed)
Addended by: Alysia Penna L on: 04/22/2023 08:20 AM   Modules accepted: Orders

## 2023-04-22 NOTE — Telephone Encounter (Signed)
Wanda Cortez placed a referral for pt today 04/22/23 for [facility]. Her new patient appt is scheduled out to far and she would like this moved to a different location.

## 2023-04-23 ENCOUNTER — Other Ambulatory Visit (HOSPITAL_COMMUNITY): Payer: Self-pay | Admitting: Gastroenterology

## 2023-04-23 ENCOUNTER — Encounter: Payer: Self-pay | Admitting: Gastroenterology

## 2023-04-23 DIAGNOSIS — R1011 Right upper quadrant pain: Secondary | ICD-10-CM | POA: Diagnosis not present

## 2023-04-23 DIAGNOSIS — K838 Other specified diseases of biliary tract: Secondary | ICD-10-CM | POA: Diagnosis not present

## 2023-04-23 NOTE — Telephone Encounter (Signed)
Spoke with patient, she is scheduled with [facility] for today.  Canceled appt with Dr. Chales Abrahams.

## 2023-04-24 ENCOUNTER — Encounter: Payer: Self-pay | Admitting: Nurse Practitioner

## 2023-04-25 ENCOUNTER — Telehealth: Payer: Self-pay | Admitting: Nurse Practitioner

## 2023-04-25 ENCOUNTER — Other Ambulatory Visit: Payer: Self-pay | Admitting: Gastroenterology

## 2023-04-25 DIAGNOSIS — R748 Abnormal levels of other serum enzymes: Secondary | ICD-10-CM

## 2023-04-25 NOTE — Telephone Encounter (Signed)
I s/w Wanda Cortez she stated she went to [facility] on Wednesday and was good to go with next appt

## 2023-04-25 NOTE — Telephone Encounter (Signed)
Patient informed of message verbalized understanding  ?

## 2023-04-25 NOTE — Telephone Encounter (Signed)
Pt got her lipase tested today, it's 191. They are sending her to get a MRI. She wants to know if you think this is the right direction.

## 2023-04-25 NOTE — Telephone Encounter (Signed)
Please advise 

## 2023-04-28 ENCOUNTER — Ambulatory Visit
Admission: RE | Admit: 2023-04-28 | Discharge: 2023-04-28 | Disposition: A | Discharge status: Home / Self Care | Payer: BC Managed Care – PPO | Source: Ambulatory Visit | Attending: Gastroenterology | Admitting: Gastroenterology

## 2023-04-28 DIAGNOSIS — R748 Abnormal levels of other serum enzymes: Secondary | ICD-10-CM

## 2023-04-28 DIAGNOSIS — R109 Unspecified abdominal pain: Secondary | ICD-10-CM | POA: Diagnosis not present

## 2023-04-28 MED ORDER — IOPAMIDOL (ISOVUE-300) INJECTION 61%
100.0000 mL | Freq: Once | INTRAVENOUS | Status: AC | PRN
  Administered 2023-04-28: 100 mL via INTRAVENOUS

## 2023-04-29 ENCOUNTER — Ambulatory Visit: Payer: BC Managed Care – PPO | Admitting: Gastroenterology

## 2023-04-29 ENCOUNTER — Ambulatory Visit (HOSPITAL_COMMUNITY)
Admission: RE | Admit: 2023-04-29 | Discharge: 2023-04-29 | Disposition: A | Discharge status: Home / Self Care | Payer: BC Managed Care – PPO | Source: Ambulatory Visit | Attending: Gastroenterology | Admitting: Gastroenterology

## 2023-04-29 DIAGNOSIS — R1011 Right upper quadrant pain: Secondary | ICD-10-CM | POA: Insufficient documentation

## 2023-04-29 MED ORDER — TECHNETIUM TC 99M MEBROFENIN IV KIT
5.0000 | PACK | Freq: Once | INTRAVENOUS | Status: AC | PRN
  Administered 2023-04-29: 5 via INTRAVENOUS

## 2023-05-01 ENCOUNTER — Inpatient Hospital Stay (HOSPITAL_BASED_OUTPATIENT_CLINIC_OR_DEPARTMENT_OTHER)
Admission: EM | Admit: 2023-05-01 | Discharge: 2023-05-05 | DRG: 988 | Disposition: A | Discharge status: Home / Self Care | Payer: BC Managed Care – PPO | Attending: Internal Medicine | Admitting: Internal Medicine

## 2023-05-01 ENCOUNTER — Other Ambulatory Visit: Payer: Self-pay

## 2023-05-01 ENCOUNTER — Encounter (HOSPITAL_BASED_OUTPATIENT_CLINIC_OR_DEPARTMENT_OTHER): Payer: Self-pay

## 2023-05-01 ENCOUNTER — Emergency Department (HOSPITAL_BASED_OUTPATIENT_CLINIC_OR_DEPARTMENT_OTHER): Payer: BC Managed Care – PPO

## 2023-05-01 DIAGNOSIS — K5710 Diverticulosis of small intestine without perforation or abscess without bleeding: Secondary | ICD-10-CM | POA: Diagnosis present

## 2023-05-01 DIAGNOSIS — Z91030 Bee allergy status: Secondary | ICD-10-CM | POA: Diagnosis not present

## 2023-05-01 DIAGNOSIS — I10 Essential (primary) hypertension: Secondary | ICD-10-CM | POA: Diagnosis present

## 2023-05-01 DIAGNOSIS — Z803 Family history of malignant neoplasm of breast: Secondary | ICD-10-CM

## 2023-05-01 DIAGNOSIS — J45909 Unspecified asthma, uncomplicated: Secondary | ICD-10-CM | POA: Diagnosis present

## 2023-05-01 DIAGNOSIS — Z87892 Personal history of anaphylaxis: Secondary | ICD-10-CM

## 2023-05-01 DIAGNOSIS — Z825 Family history of asthma and other chronic lower respiratory diseases: Secondary | ICD-10-CM

## 2023-05-01 DIAGNOSIS — R109 Unspecified abdominal pain: Secondary | ICD-10-CM | POA: Diagnosis present

## 2023-05-01 DIAGNOSIS — Z888 Allergy status to other drugs, medicaments and biological substances status: Secondary | ICD-10-CM | POA: Diagnosis not present

## 2023-05-01 DIAGNOSIS — Z8249 Family history of ischemic heart disease and other diseases of the circulatory system: Secondary | ICD-10-CM

## 2023-05-01 DIAGNOSIS — R1010 Upper abdominal pain, unspecified: Secondary | ICD-10-CM

## 2023-05-01 DIAGNOSIS — K449 Diaphragmatic hernia without obstruction or gangrene: Secondary | ICD-10-CM | POA: Diagnosis not present

## 2023-05-01 DIAGNOSIS — Z91018 Allergy to other foods: Secondary | ICD-10-CM | POA: Diagnosis not present

## 2023-05-01 DIAGNOSIS — Z91038 Other insect allergy status: Secondary | ICD-10-CM | POA: Diagnosis not present

## 2023-05-01 DIAGNOSIS — Z8261 Family history of arthritis: Secondary | ICD-10-CM | POA: Diagnosis not present

## 2023-05-01 DIAGNOSIS — K8042 Calculus of bile duct with acute cholecystitis without obstruction: Principal | ICD-10-CM | POA: Diagnosis not present

## 2023-05-01 DIAGNOSIS — R110 Nausea: Secondary | ICD-10-CM | POA: Diagnosis not present

## 2023-05-01 DIAGNOSIS — R1011 Right upper quadrant pain: Principal | ICD-10-CM | POA: Diagnosis present

## 2023-05-01 DIAGNOSIS — Z8709 Personal history of other diseases of the respiratory system: Secondary | ICD-10-CM | POA: Diagnosis not present

## 2023-05-01 DIAGNOSIS — Z836 Family history of other diseases of the respiratory system: Secondary | ICD-10-CM | POA: Diagnosis not present

## 2023-05-01 DIAGNOSIS — K819 Cholecystitis, unspecified: Secondary | ICD-10-CM | POA: Diagnosis not present

## 2023-05-01 DIAGNOSIS — K811 Chronic cholecystitis: Secondary | ICD-10-CM | POA: Diagnosis not present

## 2023-05-01 DIAGNOSIS — Z8049 Family history of malignant neoplasm of other genital organs: Secondary | ICD-10-CM

## 2023-05-01 DIAGNOSIS — Z823 Family history of stroke: Secondary | ICD-10-CM | POA: Diagnosis not present

## 2023-05-01 DIAGNOSIS — Z8262 Family history of osteoporosis: Secondary | ICD-10-CM

## 2023-05-01 HISTORY — DX: Nausea with vomiting, unspecified: R11.2

## 2023-05-01 HISTORY — DX: Other specified postprocedural states: Z98.890

## 2023-05-01 LAB — CBC
HCT: 42.4 % (ref 36.0–46.0)
Hemoglobin: 14.3 g/dL (ref 12.0–15.0)
MCH: 31 pg (ref 26.0–34.0)
MCHC: 33.7 g/dL (ref 30.0–36.0)
MCV: 91.8 fL (ref 80.0–100.0)
Platelets: 285 10*3/uL (ref 150–400)
RBC: 4.62 MIL/uL (ref 3.87–5.11)
RDW: 12.1 % (ref 11.5–15.5)
WBC: 5.6 10*3/uL (ref 4.0–10.5)
nRBC: 0 % (ref 0.0–0.2)

## 2023-05-01 LAB — COMPREHENSIVE METABOLIC PANEL
ALT: 16 U/L (ref 0–44)
AST: 18 U/L (ref 15–41)
Albumin: 4.6 g/dL (ref 3.5–5.0)
Alkaline Phosphatase: 65 U/L (ref 38–126)
Anion gap: 12 (ref 5–15)
BUN: 13 mg/dL (ref 6–20)
CO2: 27 mmol/L (ref 22–32)
Calcium: 9.9 mg/dL (ref 8.9–10.3)
Chloride: 101 mmol/L (ref 98–111)
Creatinine, Ser: 0.65 mg/dL (ref 0.44–1.00)
GFR, Estimated: >60 mL/min (ref >60)
Glucose, Bld: 110 mg/dL — ABNORMAL HIGH (ref 70–99)
Potassium: 3.7 mmol/L (ref 3.5–5.1)
Sodium: 140 mmol/L (ref 135–145)
Total Bilirubin: 0.6 mg/dL (ref 0.3–1.2)
Total Protein: 7.8 g/dL (ref 6.5–8.1)

## 2023-05-01 LAB — URINALYSIS, ROUTINE W REFLEX MICROSCOPIC
Bilirubin Urine: NEGATIVE
Glucose, UA: NEGATIVE mg/dL
Hgb urine dipstick: NEGATIVE
Ketones, ur: NEGATIVE mg/dL
Leukocytes,Ua: NEGATIVE
Nitrite: NEGATIVE
Protein, ur: NEGATIVE mg/dL
Specific Gravity, Urine: 1.01 (ref 1.005–1.030)
pH: 6 (ref 5.0–8.0)

## 2023-05-01 LAB — LIPASE, BLOOD: Lipase: 42 U/L (ref 11–51)

## 2023-05-01 MED ORDER — ONDANSETRON HCL 4 MG/2ML IJ SOLN
4.0000 mg | Freq: Once | INTRAMUSCULAR | Status: AC
  Administered 2023-05-01: 4 mg via INTRAVENOUS
  Filled 2023-05-01: qty 2

## 2023-05-01 MED ORDER — FENTANYL CITRATE PF 50 MCG/ML IJ SOSY
50.0000 ug | PREFILLED_SYRINGE | Freq: Once | INTRAMUSCULAR | Status: AC
  Administered 2023-05-01: 50 ug via INTRAVENOUS
  Filled 2023-05-01: qty 1

## 2023-05-01 NOTE — ED Triage Notes (Addendum)
Pt states that PTA developed rt. Upper quadrant pain.  States she feels fullness under her ribs.  States this is a burning pain & hardness she has never felt.  Pt having difficulty sitting.   +nausea Has had these issues before and is scheduled to see surgeon on Monday

## 2023-05-01 NOTE — ED Provider Notes (Signed)
[facility] Provider Note   CSN: [id number] Arrival date & time: 05/01/23  2241     History  Chief Complaint  Patient presents with   Abdominal Pain    Wanda Cortez is a 52 y.o. female.  The history is provided by the patient and medical records.  Abdominal Pain Wanda Cortez is a 52 y.o. female who presents to the Emergency Department complaining of abdominal pain.  She presents to the emergency department accompanied by her husband for evaluation of right upper quadrant abdominal pain that started 2 weeks ago.  She has a constant component but it does wax and wane as well.  About 10 PM she developed a hard and burning pain in her right upper quadrant.  Symptoms are severe and more intense than what has been intermittent over the last 2 weeks.  She has been seen as an outpatient and had a right upper quadrant ultrasound as well as CT abdomen pelvis, which were unremarkable.  She did have a HIDA scan performed on October 8 that showed decreased gallbladder ejection fraction.  She was started on Augmentin 2 days ago by her gastroenterologist and is scheduled to see general surgery on Monday.  Last ate at 6 PM.  She does have associated nausea.  No fevers, diarrhea, dysuria.     Home Medications Prior to Admission medications   Medication Sig Start Date End Date Taking? Authorizing Provider  albuterol (VENTOLIN HFA) 108 (90 Base) MCG/ACT inhaler INHALE ONE TO TWO PUFFS BY MOUTH EVERY 6 HOURS AS NEEDED FOR SHORTNESS OF BREATH OR FOR WHEEZING 01/31/23   Nche, Bonna Gains, NP  Ascorbic Acid (VITAMIN C) 100 MG CHEW Vitamin C    [provider]  B Complex Vitamins (VITAMIN B COMPLEX) TABS Take by mouth.    [provider]  BIOTIN PO Take 1,250 mcg by mouth daily.    [provider]  BLACK COHOSH-DONG QUAI PO Take by mouth 2 times daily at 12 noon and 4 pm.    [provider]  clonazePAM (KLONOPIN) 0.25 MG  disintegrating tablet Take 1 tablet (0.25 mg total) by mouth daily as needed. 02/27/23   Nche, Bonna Gains, NP  diphenhydrAMINE (BENADRYL) 25 mg capsule Take 25 mg by mouth 3 (three) times a week.    [provider]  EPINEPHrine 0.3 mg/0.3 mL IJ SOAJ injection epinephrine 0.3 mg/0.3 mL injection, auto-injector 08/28/21   Kozlow, Alvira Philips, MD  MULTIPLE VITAMINS-MINERALS ER PO Take by mouth.    [provider]  Spacer/Aero-Holding Chambers DEVI 1 Units by Does not apply route daily at 12 noon. 11/22/22   Nche, Bonna Gains, NP      Allergies    Loratadine, Meloxicam, Other, Wasp venom, Yellow jacket venom, Lorazepam, and Prednisone    Review of Systems   Review of Systems  Gastrointestinal:  Positive for abdominal pain.  All other systems reviewed and are negative.   Physical Exam Updated Vital Signs BP 100/63   Pulse 67   Temp (!) 97.4 F (36.3 C) (Oral)   Resp 18   Ht 5\' 6"  (1.676 m)   Wt 70.3 kg   LMP 09/01/2020   SpO2 100%   BMI 25.02 kg/m  Physical Exam Vitals and nursing note reviewed.  Constitutional:      Appearance: She is well-developed.  HENT:     Head: Normocephalic and atraumatic.  Cardiovascular:     Rate and Rhythm: Normal rate and  regular rhythm.  Pulmonary:     Effort: Pulmonary effort is normal. No respiratory distress.  Abdominal:     Palpations: Abdomen is soft.     Tenderness: There is no guarding or rebound.     Comments: Moderate right upper quadrant tenderness.  Musculoskeletal:        General: No tenderness.  Skin:    General: Skin is warm and dry.  Neurological:     Mental Status: She is alert and oriented to person, place, and time.  Psychiatric:        Behavior: Behavior normal.     ED Results / Procedures / Treatments   Labs (all labs ordered are listed, but only abnormal results are displayed) Labs Reviewed  COMPREHENSIVE METABOLIC PANEL - Abnormal; Notable for the following components:      Result Value   Glucose,  Bld 110 (*)    All other components within normal limits  LIPASE, BLOOD  CBC  URINALYSIS, ROUTINE W REFLEX MICROSCOPIC    EKG None  Radiology US Abdomen Limited RUQ (LIVER/GB)  Result Date: 05/02/2023 CLINICAL DATA:  Right upper quadrant abdominal pain EXAM: ULTRASOUND ABDOMEN LIMITED RIGHT UPPER QUADRANT COMPARISON:  None Available. FINDINGS: Gallbladder: The gallbladder is partially decompressed contributing to artifactual gallbladder wall thickening. No intraluminal stones or sludge is identified. No pericholecystic fluid identified. The sonographic Eulah Pont sign is reportedly positive, equivocal given the otherwise benign appearance of the gallbladder. Common bile duct: Diameter: 5 mm in proximal diameter, stable Liver: No focal lesion identified. Within normal limits in parenchymal echogenicity. Portal vein is patent on color Doppler imaging with normal direction of blood flow towards the liver. Other: None. IMPRESSION: 1. Normal examination. No definite sonographic evidence of acute cholecystitis. Electronically Signed   By: Helyn Numbers M.D.   On: 05/02/2023 00:53    Procedures Procedures    Medications Ordered in ED Medications  fentaNYL (SUBLIMAZE) injection 25 mcg (25 mcg Intravenous Given 05/02/23 0253)  ondansetron (ZOFRAN) injection 4 mg (4 mg Intravenous Given 05/02/23 0244)  fentaNYL (SUBLIMAZE) injection 50 mcg (50 mcg Intravenous Given 05/01/23 2338)  ondansetron (ZOFRAN) injection 4 mg (4 mg Intravenous Given 05/01/23 2336)    ED Course/ Medical Decision Making/ A&P                                 Medical Decision Making Amount and/or Complexity of Data Reviewed Labs: ordered. Radiology: ordered.  Risk Prescription drug management.   Patient here for evaluation of 2 weeks of right upper quadrant abdominal pain.  She is uncomfortable appearing on examination, does have significant tenderness on examination, positive Murphy's.  Right upper quadrant ultrasound  today is unremarkable, labs are within normal limits.  She did have a HIDA scan performed on October 8, which demonstrated reduced EF of the gallbladder concerning for chronic cholecystitis.  Given her symptoms and uncontrolled pain discussed with Dr. Dwain Sarna with general surgery.  He recommends ED to ED transfer for formal in person evaluation with surgery to the [facility] emergency department.  Discussed with patient findings of studies and surgery recommendations and she is in agreement with treatment plan.  Plan to transfer by private vehicle, has been driving to the [facility] emergency department for surgical evaluation.  Patient agrees to remain n.p.o. while awaiting surgical evaluation.  Discussed with Dr. Wilkie Aye in the [facility] emergency department, who agrees to accept the patient in transfer.  Final Clinical Impression(s) / ED Diagnoses Final diagnoses:  RUQ abdominal pain    Rx / DC Orders ED Discharge Orders     None         Tilden Fossa, MD 05/02/23 0710

## 2023-05-02 ENCOUNTER — Observation Stay (HOSPITAL_COMMUNITY): Payer: BC Managed Care – PPO

## 2023-05-02 DIAGNOSIS — R1011 Right upper quadrant pain: Secondary | ICD-10-CM | POA: Diagnosis present

## 2023-05-02 DIAGNOSIS — R110 Nausea: Secondary | ICD-10-CM | POA: Diagnosis not present

## 2023-05-02 MED ORDER — MORPHINE SULFATE (PF) 4 MG/ML IV SOLN
4.0000 mg | INTRAVENOUS | Status: DC | PRN
  Administered 2023-05-02: 4 mg via INTRAVENOUS
  Filled 2023-05-02 (×2): qty 1

## 2023-05-02 MED ORDER — ONDANSETRON HCL 4 MG/2ML IJ SOLN
4.0000 mg | Freq: Four times a day (QID) | INTRAMUSCULAR | Status: DC
  Administered 2023-05-02 – 2023-05-04 (×7): 4 mg via INTRAVENOUS
  Filled 2023-05-02 (×11): qty 2

## 2023-05-02 MED ORDER — MORPHINE SULFATE (PF) 4 MG/ML IV SOLN
4.0000 mg | Freq: Once | INTRAVENOUS | Status: AC
  Administered 2023-05-02: 4 mg via INTRAVENOUS
  Filled 2023-05-02: qty 1

## 2023-05-02 MED ORDER — FENTANYL CITRATE PF 50 MCG/ML IJ SOSY
25.0000 ug | PREFILLED_SYRINGE | INTRAMUSCULAR | Status: DC | PRN
  Administered 2023-05-02 (×2): 25 ug via INTRAVENOUS
  Filled 2023-05-02 (×2): qty 1

## 2023-05-02 MED ORDER — GADOBUTROL 1 MMOL/ML IV SOLN
7.0000 mL | Freq: Once | INTRAVENOUS | Status: AC | PRN
  Administered 2023-05-02: 7 mL via INTRAVENOUS

## 2023-05-02 MED ORDER — LORAZEPAM 2 MG/ML IJ SOLN
0.5000 mg | Freq: Once | INTRAMUSCULAR | Status: AC | PRN
  Administered 2023-05-02: 0.5 mg via INTRAVENOUS
  Filled 2023-05-02: qty 1

## 2023-05-02 MED ORDER — SODIUM CHLORIDE 0.9% FLUSH
3.0000 mL | Freq: Two times a day (BID) | INTRAVENOUS | Status: DC
  Administered 2023-05-02 – 2023-05-04 (×5): 3 mL via INTRAVENOUS

## 2023-05-02 MED ORDER — PANTOPRAZOLE SODIUM 40 MG IV SOLR
40.0000 mg | Freq: Two times a day (BID) | INTRAVENOUS | Status: DC
  Administered 2023-05-02: 40 mg via INTRAVENOUS
  Filled 2023-05-02 (×7): qty 10

## 2023-05-02 MED ORDER — ENOXAPARIN SODIUM 40 MG/0.4ML IJ SOSY
40.0000 mg | PREFILLED_SYRINGE | INTRAMUSCULAR | Status: DC
  Administered 2023-05-02 – 2023-05-04 (×3): 40 mg via SUBCUTANEOUS
  Filled 2023-05-02 (×3): qty 0.4

## 2023-05-02 MED ORDER — ACETAMINOPHEN 325 MG PO TABS
650.0000 mg | ORAL_TABLET | Freq: Four times a day (QID) | ORAL | Status: DC | PRN
  Administered 2023-05-04 – 2023-05-05 (×4): 650 mg via ORAL
  Filled 2023-05-02 (×4): qty 2

## 2023-05-02 MED ORDER — SODIUM CHLORIDE 0.9 % IV SOLN: INTRAVENOUS | Status: AC

## 2023-05-02 MED ORDER — ACETAMINOPHEN 650 MG RE SUPP: 650.0000 mg | Freq: Four times a day (QID) | RECTAL | Status: DC | PRN

## 2023-05-02 NOTE — Progress Notes (Signed)
Patient's office computer chart and hospital computer chart reviewed and case discussed briefly with the hospital team and surgical note appreciated and we will proceed with MRCP first will out CBD stones evaluate her gallbladder and if no significant findings will need either an inpatient or outpatient endoscopy next and please call me if any specific question or problem otherwise we will wait on MRI as above

## 2023-05-02 NOTE — ED Notes (Signed)
ED TO INPATIENT HANDOFF REPORT  ED Nurse Name and Phone #: Dahlia Client [phone]  S Name/Age/Gender Wanda Cortez 52 y.o. female Room/Bed: 002C/002C  Code Status   Code Status: Not on file  Home/SNF/Other Home Patient oriented to: self, place, time, and situation Is this baseline? Yes   Triage Complete: Triage complete  Chief Complaint Right upper quadrant abdominal pain [R10.11]  Triage Note Pt states that PTA developed rt. Upper quadrant pain.  States she feels fullness under her ribs.  States this is a burning pain & hardness she has never felt.  Pt having difficulty sitting.   +nausea Has had these issues before and is scheduled to see surgeon on Monday  Pt transfer from [facility] to [facility]. RUQ pain currently 6/10. Pt states no changes on drive over.    Allergies Allergies  Allergen Reactions   Loratadine Palpitations   Meloxicam Anaphylaxis   Other Shortness Of Breath and Cough    almonds, gluten   Wasp Venom Itching and Hives   Yellow Jacket Venom Anaphylaxis   Lorazepam Nausea And Vomiting   Nsaids    Prednisone Rash    Level of Care/Admitting Diagnosis ED Disposition     ED Disposition  Admit   Condition  --   Comment  Hospital Area: [facility] [[id number]]  Level of Care: Med-Surg [16]  May place patient in observation at [facility] or [facility] if equivalent level of care is available:: No  Covid Evaluation: Asymptomatic - no recent exposure (last 10 days) testing not required  Diagnosis: Right upper quadrant abdominal pain [621308]  Admitting Physician: Clydie Braun [[id number]]  Attending Physician: Clydie Braun [[id number]]          B Medical/Surgery History Past Medical History:  Diagnosis Date   Asthma    Bronchitis    Pleurisy    Recurrent upper respiratory infection (URI)    Urticaria    Past Surgical History:  Procedure Laterality Date   APPENDECTOMY     CERVICAL BIOPSY  W/ LOOP ELECTRODE  EXCISION     KNEE SURGERY Right 2018     A IV Location/Drains/Wounds Patient Lines/Drains/Airways Status     Active Line/Drains/Airways     Name Placement date Placement time Site Days   Peripheral IV 05/01/23 20 G Anterior;Left;Proximal Antecubital 05/01/23  2301  Antecubital  1            Intake/Output Last 24 hours No intake or output data in the 24 hours ending 05/02/23 1131  Labs/Imaging Results for orders placed or performed during the hospital encounter of 05/01/23 (from the past 48 hour(s))  Lipase, blood     Status: None   Collection Time: 05/01/23 11:02 PM  Result Value Ref Range   Lipase 42 11 - 51 U/L    Comment: Performed at [facility], [address] [place], [place] [address]  Comprehensive metabolic panel     Status: Abnormal   Collection Time: 05/01/23 11:02 PM  Result Value Ref Range   Sodium 140 135 - 145 mmol/L   Potassium 3.7 3.5 - 5.1 mmol/L   Chloride 101 98 - 111 mmol/L   CO2 27 22 - 32 mmol/L   Glucose, Bld 110 (H) 70 - 99 mg/dL    Comment: Glucose reference range applies only to samples taken after fasting for at least 8 hours.   BUN 13 6 - 20 mg/dL   Creatinine, Ser 0.10 0.44 -  1.00 mg/dL   Calcium 9.9 8.9 - 54.0 mg/dL   Total Protein 7.8 6.5 - 8.1 g/dL   Albumin 4.6 3.5 - 5.0 g/dL   AST 18 15 - 41 U/L   ALT 16 0 - 44 U/L   Alkaline Phosphatase 65 38 - 126 U/L   Total Bilirubin 0.6 0.3 - 1.2 mg/dL   GFR, Estimated >98 >11 mL/min    Comment: (NOTE) Calculated using the CKD-EPI Creatinine Equation (2021)    Anion gap 12 5 - 15    Comment: Performed at [facility], [address] [place], [place] [address]  CBC     Status: None   Collection Time: 05/01/23 11:02 PM  Result Value Ref Range   WBC 5.6 4.0 - 10.5 K/uL   RBC 4.62 3.87 - 5.11 MIL/uL   Hemoglobin 14.3 12.0 - 15.0 g/dL   HCT 29.5 62.1 - 30.8 %   MCV 91.8 80.0 - 100.0 fL   MCH 31.0 26.0 - 34.0 pg   MCHC 33.7 30.0 - 36.0 g/dL   RDW 65.7 84.6 - 96.2  %   Platelets 285 150 - 400 K/uL   nRBC 0.0 0.0 - 0.2 %    Comment: Performed at [facility], [address] [place], [place] [address]  Urinalysis, Routine w reflex microscopic -Urine, Clean Catch     Status: None   Collection Time: 05/01/23 11:32 PM  Result Value Ref Range   Color, Urine YELLOW YELLOW   APPearance CLEAR CLEAR   Specific Gravity, Urine 1.010 1.005 - 1.030   pH 6.0 5.0 - 8.0   Glucose, UA NEGATIVE NEGATIVE mg/dL   Hgb urine dipstick NEGATIVE NEGATIVE   Bilirubin Urine NEGATIVE NEGATIVE   Ketones, ur NEGATIVE NEGATIVE mg/dL   Protein, ur NEGATIVE NEGATIVE mg/dL   Nitrite NEGATIVE NEGATIVE   Leukocytes,Ua NEGATIVE NEGATIVE    Comment: Microscopic not done on urines with negative protein, blood, leukocytes, nitrite, or glucose < 500 mg/dL. Performed at [facility], [address] [place], [place] [address]    US Abdomen Limited RUQ (LIVER/GB)  Result Date: 05/02/2023 CLINICAL DATA:  Right upper quadrant abdominal pain EXAM: ULTRASOUND ABDOMEN LIMITED RIGHT UPPER QUADRANT COMPARISON:  None Available. FINDINGS: Gallbladder: The gallbladder is partially decompressed contributing to artifactual gallbladder wall thickening. No intraluminal stones or sludge is identified. No pericholecystic fluid identified. The sonographic Eulah Pont sign is reportedly positive, equivocal given the otherwise benign appearance of the gallbladder. Common bile duct: Diameter: 5 mm in proximal diameter, stable Liver: No focal lesion identified. Within normal limits in parenchymal echogenicity. Portal vein is patent on color Doppler imaging with normal direction of blood flow towards the liver. Other: None. IMPRESSION: 1. Normal examination. No definite sonographic evidence of acute cholecystitis. Electronically Signed   By: Helyn Numbers M.D.   On: 05/02/2023 00:53    Pending Labs Unresulted Labs (From admission, onward)    None       Vitals/Pain Today's Vitals    05/02/23 0704 05/02/23 0804 05/02/23 1103 05/02/23 1105  BP: 118/72   115/68  Pulse: 73   79  Resp: 18   20  Temp: (!) 97.5 F (36.4 C)   97.6 F (36.4 C)  TempSrc: Oral   Oral  SpO2: 100%   98%  Weight:      Height:      PainSc:  4  7      Isolation Precautions No active isolations  Medications Medications  fentaNYL (SUBLIMAZE) injection 25 mcg (25 mcg Intravenous Given 05/02/23 0605)  ondansetron (ZOFRAN) injection 4 mg (4 mg Intravenous Given 05/02/23 1104)  pantoprazole (PROTONIX) injection 40 mg (40 mg Intravenous Given 05/02/23 0939)  LORazepam (ATIVAN) injection 0.5 mg (has no administration in time range)  morphine (PF) 4 MG/ML injection 4 mg (has no administration in time range)  fentaNYL (SUBLIMAZE) injection 50 mcg (50 mcg Intravenous Given 05/01/23 2338)  ondansetron (ZOFRAN) injection 4 mg (4 mg Intravenous Given 05/01/23 2336)  morphine (PF) 4 MG/ML injection 4 mg (4 mg Intravenous Given 05/02/23 0804)  morphine (PF) 4 MG/ML injection 4 mg (4 mg Intravenous Given 05/02/23 1104)    Mobility walks     Focused Assessments     R Recommendations: See Admitting Provider Note  Report given to:   Additional Notes:

## 2023-05-02 NOTE — H&P (Addendum)
History and Physical    Patient: Wanda Cortez UXL:[id number] DOB: 09-Nov-1970 DOA: 05/01/2023 DOS: the patient was seen and examined on 05/02/2023 PCP: Anne Ng, NP  Patient coming from: Home  Chief Complaint:  Chief Complaint  Patient presents with   Abdominal Pain   HPI: Wanda Cortez is a 52 y.o. female with medical history significant of asthma who presented with complaints of intermittent abdominal pain for the last 2 weeks.  Patient noted that symptoms usually occurred 30 to 45 minutes after eating.  She describes it as a sharp pain in the right upper quadrant of her abdomen that radiates to her back and into her shoulder at times.  Notes associated symptoms of nausea.  Patient makes note that when exhaling pain seems to worsen as well.  She has not had any significant fever, chills, chest pain, vomiting, diarrhea, or dysuria symptoms.  Patient does not drink alcohol or smoke cigarettes.  She had been evaluated by her primary care provider who had ordered an ultrasound which noted the bile duct to be mildly dilated.  She had been referred to [facility] gastroenterology and had HIDA scan performed on 10/8 which noted her to have a patent cystic duct with unclear EF.  She had been placed on Augmentin approximately 2 days ago just in case by the gastroenterologist.  In the emergency department patient was noted to be afebrile with stable vital signs.  Labs were unremarkable including LFTs.  Right upper quadrant ultrasound was normal without any findings of cholecystitis.  General surgery had been consulted and recommended GI evaluation.  Patient had been given antiemetics and pain medication.  MRCP was ordered.  Review of Systems: As mentioned in the history of present illness. All other systems reviewed and are negative. Past Medical History:  Diagnosis Date   Asthma    Bronchitis    Pleurisy    Recurrent upper respiratory infection (URI)    Urticaria    Past Surgical  History:  Procedure Laterality Date   APPENDECTOMY     CERVICAL BIOPSY  W/ LOOP ELECTRODE EXCISION     KNEE SURGERY Right 2018   Social History:  reports that she has never smoked. She has never used smokeless tobacco. She reports that she does not drink alcohol and does not use drugs.  Allergies  Allergen Reactions   Loratadine Palpitations   Meloxicam Anaphylaxis   Other Shortness Of Breath and Cough    almonds, gluten   Wasp Venom Itching and Hives   Yellow Jacket Venom Anaphylaxis   Lorazepam Nausea And Vomiting   Nsaids    Prednisone Rash    Family History  Problem Relation Age of Onset   Asthma Mother    Allergic rhinitis Mother    Breast cancer Mother        73s   Allergic rhinitis Father    Stroke Father    Hypertension Father    ALS Father 5       negative genetic test   Allergic rhinitis Sister    Arthritis Sister    Allergic rhinitis Sister    Stroke Maternal Grandmother    Uterine cancer Maternal Grandmother    Osteoporosis Maternal Grandmother    Heart disease Paternal Grandmother    Stroke Paternal Grandfather    Heart disease Paternal Grandfather    Allergic rhinitis Nephew    Allergic rhinitis Niece     Prior to Admission medications   Medication Sig Start Date End Date Taking? Authorizing  Provider  amoxicillin-clavulanate (AUGMENTIN) 875-125 MG tablet Take 1 tablet by mouth 2 (two) times daily. 04/29/23  Yes [provider]  Ascorbic Acid (VITAMIN C) 100 MG CHEW Chew 1 tablet by mouth daily.   Yes [provider]  B Complex Vitamins (VITAMIN B COMPLEX) TABS Take 1 tablet by mouth daily.   Yes [provider]  BIOTIN PO Take 1,250 mcg by mouth daily.   Yes [provider]  BLACK COHOSH-DONG QUAI PO Take 1 tablet by mouth 2 times daily at 12 noon and 4 pm.   Yes [provider]  EPINEPHrine 0.3 mg/0.3 mL IJ SOAJ injection epinephrine 0.3 mg/0.3 mL injection, auto-injector Patient taking differently:  Inject 0.3 mg into the muscle as needed for anaphylaxis. 08/28/21  Yes Kozlow, Alvira Philips, MD  MULTIPLE VITAMINS-MINERALS ER PO Take 1 tablet by mouth daily.   Yes [provider]  Spacer/Aero-Holding Chambers DEVI 1 Units by Does not apply route daily at 12 noon. 11/22/22  Yes Nche, Bonna Gains, NP  albuterol (VENTOLIN HFA) 108 (90 Base) MCG/ACT inhaler INHALE ONE TO TWO PUFFS BY MOUTH EVERY 6 HOURS AS NEEDED FOR SHORTNESS OF BREATH OR FOR WHEEZING 01/31/23   Nche, Bonna Gains, NP    Physical Exam: Vitals:   05/02/23 0605 05/02/23 0615 05/02/23 0704 05/02/23 1105  BP: 103/67 97/60 118/72 115/68  Pulse: 81 75 73 79  Resp: 20  18 20   Temp: 97.8 F (36.6 C)  (!) 97.5 F (36.4 C) 97.6 F (36.4 C)  TempSrc: Oral  Oral Oral  SpO2: 98% 91% 100% 98%  Weight:      Height:        Constitutional: Middle-age female who appears to be in some discomfort. Eyes: PERRL, lids and conjunctivae normal ENMT: Mucous membranes are moist. Posterior pharynx clear of any exudate or lesions.Normal dentition.  Neck: normal, supple, no masses, no thyromegaly Respiratory: clear to auscultation bilaterally, no wheezing, no crackles. Normal respiratory effort. No accessory muscle use.  Cardiovascular: Regular rate and rhythm, no murmurs / rubs / gallops. No extremity edema. 2+ pedal pulses. No carotid bruits.  Abdomen: Upper quadrant tenderness to palpation.  Bowel sounds present in all 4 quadrants Musculoskeletal: no clubbing / cyanosis. No joint deformity upper and lower extremities. Good ROM, no contractures. Normal muscle tone.  Skin: no rashes, lesions, ulcers. No induration Neurologic: CN 2-12 grossly intact. Sensation intact, DTR normal. Strength 5/5 in all 4.  Psychiatric: Normal judgment and insight. Alert and oriented x 3. Normal mood.   Data Reviewed:  EKG revealed normal sinus rhythm at 79 bpm with no acute ST changes.  Reviewed labs, imaging, and pertinent records as documented in this  note.  Assessment and Plan:  Right upper quadrant abdominal pain Acute.  Patient presents with a almost 2-week history of right upper quadrant abdominal pain especially after eating.  Reported to have abnormal HIDA scan had noted a patent cystic duct with unclear EF.  General surgery have been consulted and recommended MRCP. -Admit to a medical telemetry -N.p.o. after midnight -Did not continue antibiotics at this time as patient is afebrile without leukocytosis or signs of cholecystitis -Follow-up MRCP(MRCP was negative for choledocholithiasis or cholecystitis. Gastroenterology plans on performing endoscopy in a.m.) -Normal saline IV fluids 100 mL/h for 1 L -Morphine IV as needed for pain -Antiemetics as needed -Appreciate general surgery and GI consultative services  GI prophylaxis: Protonix DVT prophylaxis: Lovenox Advance Care Planning:   Code Status: Full Code  Consults: General Surgery  Family Communication: Patient's husband updated at bedside  Severity of Illness: The appropriate patient status for this patient is OBSERVATION. Observation status is judged to be reasonable and necessary in order to provide the required intensity of service to ensure the patient's safety. The patient's presenting symptoms, physical exam findings, and initial radiographic and laboratory data in the context of their medical condition is felt to place them at decreased risk for further clinical deterioration. Furthermore, it is anticipated that the patient will be medically stable for discharge from the hospital within 2 midnights of admission.   Author: Clydie Braun, MD 05/02/2023 11:17 AM  For on call review [email or web address].

## 2023-05-02 NOTE — Progress Notes (Addendum)
I reviewed MRI and I did not see anything but awaiting the official word but in the meantime can have clear liquids and if MRCP positive for CBD stones will need ERCP tomorrow but otherwise for her pain we will proceed with an endoscopy which I have tentatively scheduled at 730 tomorrow and make her n.p.o. after midnight for the above and as an addendum her MRI read by radiology is negative so we will proceed with endoscopy tomorrow morning as above

## 2023-05-02 NOTE — ED Notes (Addendum)
Note made in error

## 2023-05-02 NOTE — Plan of Care (Signed)

## 2023-05-02 NOTE — Plan of Care (Signed)
  Problem: Health Behavior/Discharge Planning: Goal: Ability to manage health-related needs will improve Outcome: Progressing   Problem: Activity: Goal: Risk for activity intolerance will decrease Outcome: Progressing   Problem: Pain Managment: Goal: General experience of comfort will improve Outcome: Progressing   

## 2023-05-02 NOTE — ED Notes (Signed)
Patients IV secured for POV transfer. Patient and husband verbalized instructions of going to [facility] ED. Patient driven by husband.

## 2023-05-02 NOTE — ED Provider Notes (Signed)
  Physical Exam  BP 118/72 (BP Location: Right Arm)   Pulse 73   Temp (!) 97.5 F (36.4 C) (Oral)   Resp 18   Ht 5\' 6"  (1.676 m)   Wt 70.3 kg   LMP 09/01/2020   SpO2 100%   BMI 25.02 kg/m   Physical Exam Vitals and nursing note reviewed.  Constitutional:      General: She is not in acute distress.    Appearance: She is well-developed. She is ill-appearing.  HENT:     Head: Normocephalic and atraumatic.  Eyes:     Conjunctiva/sclera: Conjunctivae normal.  Cardiovascular:     Rate and Rhythm: Normal rate and regular rhythm.     Heart sounds: No murmur heard. Pulmonary:     Effort: Pulmonary effort is normal. No respiratory distress.     Breath sounds: Normal breath sounds.  Abdominal:     Palpations: Abdomen is soft.     Tenderness: There is abdominal tenderness in the right upper quadrant.  Musculoskeletal:        General: No swelling.     Cervical back: Neck supple.  Skin:    General: Skin is warm and dry.     Capillary Refill: Capillary refill takes less than 2 seconds.  Neurological:     Mental Status: She is alert.  Psychiatric:        Mood and Affect: Mood normal.     Procedures  Procedures  ED Course / MDM    Medical Decision Making Amount and/or Complexity of Data Reviewed Labs: ordered. Radiology: ordered.  Risk Prescription drug management.   Patient seen in transfer.  Persistent right upper quadrant pain over the last 2 weeks with abnormal outpatient HIDA.  Right upper quadrant ultrasound normal at [facility].  Transferred for surgical evaluation.  General surgery team evaluated the patient and is recommending medical admission with GI consultation.  Surgery will continue to follow and patient may ultimately need cholecystectomy but given normal imaging and lab work, will not go emergently to the operating room.  I sent a epic message to the [facility] GI team to have them evaluate the patient on the floor.  Patient admitted       Glendora Score, MD 05/02/23 [phone]

## 2023-05-02 NOTE — ED Triage Notes (Signed)
Pt transfer from [facility] to [facility]. RUQ pain currently 6/10. Pt states no changes on drive over.

## 2023-05-02 NOTE — ED Notes (Signed)
Patient transported to MRI 

## 2023-05-02 NOTE — Consult Note (Signed)
Consult Note  Wanda Cortez 07-09-1971  [id number].    Requesting MD: Glendora Score, MD Chief Complaint/Reason for Consult: RUQ pain and nausea  HPI:  Patient is a 52 year old female who presented to [facility] with RUQ abdominal pain and nausea for the last 2 weeks. Pain has been intermittent but progressively worsening and seems to be brought on by eating. Initially pain was occurring 30-45 min after eating but per husband now can't leave the table without complaining of pain. She has had associated nausea but no emesis. She denies fever, chills, chest pain, SOB, urinary symptoms, vaginal bleeding or discharge. She denies constipation or diarrhea but does report stools have been a little lighter in color - almost orange. She has a long history of abdominal pain with workup in chart back to 2014. She underwent EGD in 2016 which showed hiatal hernia and she previously was taking PPI but stopped some time ago secondary to diarrhea. She has not currently taken any PPI or H2 blocker. She was referred to GI as an outpatient and got an [place] and HIDA scan which were negative for cholecystitis but showed concern for biliary dyskinesia. She is reporting persistent discomfort currently. Husband is at bedside in ED. She has had prior laparoscopic appendectomy but no other abdominal surgery. No new medications recently. Not on any blood thinners. She follows a vegetarian diet and denies changes in diet recently as well. She is pretty active. PMH otherwise significant for HTN, asthma, anxiety, GERD. Denies alcohol, tobacco or illicit drug use.   ROS: Negative other than HPI  Family History  Problem Relation Age of Onset   Asthma Mother    Allergic rhinitis Mother    Breast cancer Mother        42s   Allergic rhinitis Father    Stroke Father    Hypertension Father    ALS Father 55       negative genetic test   Allergic rhinitis Sister    Arthritis Sister    Allergic rhinitis Sister    Stroke  Maternal Grandmother    Uterine cancer Maternal Grandmother    Osteoporosis Maternal Grandmother    Heart disease Paternal Grandmother    Stroke Paternal Grandfather    Heart disease Paternal Grandfather    Allergic rhinitis Nephew    Allergic rhinitis Niece     Past Medical History:  Diagnosis Date   Asthma    Bronchitis    Pleurisy    Recurrent upper respiratory infection (URI)    Urticaria     Past Surgical History:  Procedure Laterality Date   APPENDECTOMY     CERVICAL BIOPSY  W/ LOOP ELECTRODE EXCISION     KNEE SURGERY Right 2018    Social History:  reports that she has never smoked. She has never used smokeless tobacco. She reports that she does not drink alcohol and does not use drugs.  Allergies:  Allergies  Allergen Reactions   Loratadine Palpitations   Meloxicam Anaphylaxis   Other Shortness Of Breath and Cough    Eggs, almonds, gluten   Wasp Venom Itching and Hives   Yellow Jacket Venom Anaphylaxis   Lorazepam Nausea And Vomiting   Prednisone Rash    (Not in a hospital admission)   Blood pressure 118/72, pulse 73, temperature (!) 97.5 F (36.4 C), temperature source Oral, resp. rate 18, height 5\' 6"  (1.676 m), weight 70.3 kg, last menstrual period 09/01/2020, SpO2 100%. Physical Exam:  General: pleasant,  WD, WN female who is sitting in bed and appears uncomfortable HEENT: head is normocephalic, atraumatic.  Sclera anicteric.  Ears and nose without any masses or lesions.  Mouth is pink and moist Heart: regular, rate, and rhythm.  Normal s1,s2. No obvious murmurs, gallops, or rubs noted.  Palpable radial and pedal pulses bilaterally Lungs: CTAB, no wheezes, rhonchi, or rales noted.  Respiratory effort nonlabored Abd: soft, TTP in RUQ and epigastric abdomen, ND, +BS, no masses, hernias, or organomegaly MS: all 4 extremities are symmetrical with no cyanosis, clubbing, or edema. Skin: warm and dry with no masses, lesions, or rashes Neuro: Cranial nerves  2-12 grossly intact, sensation is normal throughout Psych: A&Ox3 with an appropriate affect.   Results for orders placed or performed during the hospital encounter of 05/01/23 (from the past 48 hour(s))  Lipase, blood     Status: None   Collection Time: 05/01/23 11:02 PM  Result Value Ref Range   Lipase 42 11 - 51 U/L    Comment: Performed at [facility], [address] [place], [place] [address]  Comprehensive metabolic panel     Status: Abnormal   Collection Time: 05/01/23 11:02 PM  Result Value Ref Range   Sodium 140 135 - 145 mmol/L   Potassium 3.7 3.5 - 5.1 mmol/L   Chloride 101 98 - 111 mmol/L   CO2 27 22 - 32 mmol/L   Glucose, Bld 110 (H) 70 - 99 mg/dL    Comment: Glucose reference range applies only to samples taken after fasting for at least 8 hours.   BUN 13 6 - 20 mg/dL   Creatinine, Ser 4.13 0.44 - 1.00 mg/dL   Calcium 9.9 8.9 - 24.4 mg/dL   Total Protein 7.8 6.5 - 8.1 g/dL   Albumin 4.6 3.5 - 5.0 g/dL   AST 18 15 - 41 U/L   ALT 16 0 - 44 U/L   Alkaline Phosphatase 65 38 - 126 U/L   Total Bilirubin 0.6 0.3 - 1.2 mg/dL   GFR, Estimated >01 >02 mL/min    Comment: (NOTE) Calculated using the CKD-EPI Creatinine Equation (2021)    Anion gap 12 5 - 15    Comment: Performed at [facility], [address] [place], [place] [address]  CBC     Status: None   Collection Time: 05/01/23 11:02 PM  Result Value Ref Range   WBC 5.6 4.0 - 10.5 K/uL   RBC 4.62 3.87 - 5.11 MIL/uL   Hemoglobin 14.3 12.0 - 15.0 g/dL   HCT 64.4 03.4 - 74.2 %   MCV 91.8 80.0 - 100.0 fL   MCH 31.0 26.0 - 34.0 pg   MCHC 33.7 30.0 - 36.0 g/dL   RDW 59.5 63.8 - 75.6 %   Platelets 285 150 - 400 K/uL   nRBC 0.0 0.0 - 0.2 %    Comment: Performed at [facility], [address] [place], [place] [address]  Urinalysis, Routine w reflex microscopic -Urine, Clean Catch     Status: None   Collection Time: 05/01/23 11:32 PM  Result Value Ref Range   Color, Urine YELLOW  YELLOW   APPearance CLEAR CLEAR   Specific Gravity, Urine 1.010 1.005 - 1.030   pH 6.0 5.0 - 8.0   Glucose, UA NEGATIVE NEGATIVE mg/dL   Hgb urine dipstick NEGATIVE NEGATIVE   Bilirubin Urine NEGATIVE NEGATIVE   Ketones, ur NEGATIVE NEGATIVE mg/dL   Protein, ur NEGATIVE NEGATIVE mg/dL  Nitrite NEGATIVE NEGATIVE   Leukocytes,Ua NEGATIVE NEGATIVE    Comment: Microscopic not done on urines with negative protein, blood, leukocytes, nitrite, or glucose < 500 mg/dL. Performed at [facility], [address] [place], [place] [address]    US Abdomen Limited RUQ (LIVER/GB)  Result Date: 05/02/2023 CLINICAL DATA:  Right upper quadrant abdominal pain EXAM: ULTRASOUND ABDOMEN LIMITED RIGHT UPPER QUADRANT COMPARISON:  None Available. FINDINGS: Gallbladder: The gallbladder is partially decompressed contributing to artifactual gallbladder wall thickening. No intraluminal stones or sludge is identified. No pericholecystic fluid identified. The sonographic Eulah Pont sign is reportedly positive, equivocal given the otherwise benign appearance of the gallbladder. Common bile duct: Diameter: 5 mm in proximal diameter, stable Liver: No focal lesion identified. Within normal limits in parenchymal echogenicity. Portal vein is patent on color Doppler imaging with normal direction of blood flow towards the liver. Other: None. IMPRESSION: 1. Normal examination. No definite sonographic evidence of acute cholecystitis. Electronically Signed   By: Helyn Numbers M.D.   On: 05/02/2023 00:53      Assessment/Plan RUQ pain and nausea - no gallstones present on imaging dating back to 2014 - HIDA on 10/8 with patent cystic duct, unclear EF (clarifying with radiology) - argues against cholecystitis however HIDA is a dynamic test and gallbladder EF may be secondarily affected if some other intraabdominal process is going on - no leukocytosis, normal LFTs and normal lipase - would recommend GI consult as inpatient -  she just recently saw Dr. Levora Angel at [facility]  - we will follow pending workup but no plans for surgical intervention at this time   FEN: NPO VTE: defer to admitting service ID: no current abx, afebrile and no leukocytosis   - per admitting -  HTN Hx of GERD Hx of hiatal hernia  Asthma  Anxiety   I reviewed ED provider notes, last 24 h vitals and pain scores, last 48 h intake and output, last 24 h labs and trends, last 24 h imaging results, and outpatient primary care notes, previous workup for episodic abdominal pain .   Juliet Rude, [facility] 05/02/2023, 9:06 AM Please see Amion for pager number during day hours 7:00am-4:30pm

## 2023-05-03 ENCOUNTER — Encounter (HOSPITAL_COMMUNITY): Payer: Self-pay | Admitting: Internal Medicine

## 2023-05-03 ENCOUNTER — Encounter (HOSPITAL_COMMUNITY)
Admission: EM | Disposition: A | Discharge status: Home / Self Care | Payer: Self-pay | Source: Home / Self Care | Attending: Internal Medicine

## 2023-05-03 ENCOUNTER — Observation Stay (HOSPITAL_COMMUNITY): Payer: BC Managed Care – PPO | Admitting: Registered Nurse

## 2023-05-03 DIAGNOSIS — Z91018 Allergy to other foods: Secondary | ICD-10-CM | POA: Diagnosis not present

## 2023-05-03 DIAGNOSIS — Z8262 Family history of osteoporosis: Secondary | ICD-10-CM | POA: Diagnosis not present

## 2023-05-03 DIAGNOSIS — Z8249 Family history of ischemic heart disease and other diseases of the circulatory system: Secondary | ICD-10-CM | POA: Diagnosis not present

## 2023-05-03 DIAGNOSIS — K449 Diaphragmatic hernia without obstruction or gangrene: Secondary | ICD-10-CM | POA: Diagnosis present

## 2023-05-03 DIAGNOSIS — Z8261 Family history of arthritis: Secondary | ICD-10-CM | POA: Diagnosis not present

## 2023-05-03 DIAGNOSIS — Z825 Family history of asthma and other chronic lower respiratory diseases: Secondary | ICD-10-CM | POA: Diagnosis not present

## 2023-05-03 DIAGNOSIS — Z888 Allergy status to other drugs, medicaments and biological substances status: Secondary | ICD-10-CM | POA: Diagnosis not present

## 2023-05-03 DIAGNOSIS — R1011 Right upper quadrant pain: Secondary | ICD-10-CM | POA: Diagnosis present

## 2023-05-03 DIAGNOSIS — Z836 Family history of other diseases of the respiratory system: Secondary | ICD-10-CM | POA: Diagnosis not present

## 2023-05-03 DIAGNOSIS — Z91030 Bee allergy status: Secondary | ICD-10-CM | POA: Diagnosis not present

## 2023-05-03 DIAGNOSIS — K5710 Diverticulosis of small intestine without perforation or abscess without bleeding: Secondary | ICD-10-CM | POA: Diagnosis present

## 2023-05-03 DIAGNOSIS — R109 Unspecified abdominal pain: Secondary | ICD-10-CM | POA: Diagnosis present

## 2023-05-03 DIAGNOSIS — Z803 Family history of malignant neoplasm of breast: Secondary | ICD-10-CM | POA: Diagnosis not present

## 2023-05-03 DIAGNOSIS — K8042 Calculus of bile duct with acute cholecystitis without obstruction: Secondary | ICD-10-CM | POA: Diagnosis not present

## 2023-05-03 DIAGNOSIS — Z87892 Personal history of anaphylaxis: Secondary | ICD-10-CM | POA: Diagnosis not present

## 2023-05-03 DIAGNOSIS — J45909 Unspecified asthma, uncomplicated: Secondary | ICD-10-CM | POA: Diagnosis present

## 2023-05-03 DIAGNOSIS — I10 Essential (primary) hypertension: Secondary | ICD-10-CM | POA: Diagnosis present

## 2023-05-03 DIAGNOSIS — Z8049 Family history of malignant neoplasm of other genital organs: Secondary | ICD-10-CM | POA: Diagnosis not present

## 2023-05-03 DIAGNOSIS — Z823 Family history of stroke: Secondary | ICD-10-CM | POA: Diagnosis not present

## 2023-05-03 DIAGNOSIS — Z8709 Personal history of other diseases of the respiratory system: Secondary | ICD-10-CM | POA: Diagnosis not present

## 2023-05-03 DIAGNOSIS — Z91038 Other insect allergy status: Secondary | ICD-10-CM | POA: Diagnosis not present

## 2023-05-03 HISTORY — PX: ESOPHAGOGASTRODUODENOSCOPY (EGD) WITH PROPOFOL: SHX5813

## 2023-05-03 LAB — BASIC METABOLIC PANEL WITH GFR
Anion gap: 8 (ref 5–15)
BUN: 11 mg/dL (ref 6–20)
CO2: 24 mmol/L (ref 22–32)
Calcium: 8.9 mg/dL (ref 8.9–10.3)
Chloride: 107 mmol/L (ref 98–111)
Creatinine, Ser: 0.7 mg/dL (ref 0.44–1.00)
GFR, Estimated: >60 mL/min
Glucose, Bld: 90 mg/dL (ref 70–99)
Potassium: 4.4 mmol/L (ref 3.5–5.1)
Sodium: 139 mmol/L (ref 135–145)

## 2023-05-03 LAB — CBC
HCT: 36.1 % (ref 36.0–46.0)
Hemoglobin: 11.6 g/dL — ABNORMAL LOW (ref 12.0–15.0)
MCH: 30.3 pg (ref 26.0–34.0)
MCHC: 32.1 g/dL (ref 30.0–36.0)
MCV: 94.3 fL (ref 80.0–100.0)
Platelets: 235 10*3/uL (ref 150–400)
RBC: 3.83 MIL/uL — ABNORMAL LOW (ref 3.87–5.11)
RDW: 12.1 % (ref 11.5–15.5)
WBC: 3.9 10*3/uL — ABNORMAL LOW (ref 4.0–10.5)
nRBC: 0 % (ref 0.0–0.2)

## 2023-05-03 LAB — HIV ANTIBODY (ROUTINE TESTING W REFLEX): HIV Screen 4th Generation wRfx: NONREACTIVE

## 2023-05-03 SURGERY — ESOPHAGOGASTRODUODENOSCOPY (EGD) WITH PROPOFOL
Anesthesia: Monitor Anesthesia Care

## 2023-05-03 MED ORDER — PROPOFOL 10 MG/ML IV BOLUS
INTRAVENOUS | Status: DC | PRN
  Administered 2023-05-03 (×2): 30 mg via INTRAVENOUS
  Administered 2023-05-03: 50 mg via INTRAVENOUS

## 2023-05-03 MED ORDER — PROPOFOL 500 MG/50ML IV EMUL
INTRAVENOUS | Status: DC | PRN
  Administered 2023-05-03: 150 ug/kg/min via INTRAVENOUS

## 2023-05-03 MED ORDER — GLYCOPYRROLATE PF 0.2 MG/ML IJ SOSY
PREFILLED_SYRINGE | INTRAMUSCULAR | Status: DC | PRN
  Administered 2023-05-03: .1 mg via INTRAVENOUS

## 2023-05-03 MED ORDER — LIDOCAINE 2% (20 MG/ML) 5 ML SYRINGE
INTRAMUSCULAR | Status: DC | PRN
  Administered 2023-05-03: 50 mg via INTRAVENOUS

## 2023-05-03 MED ORDER — HYDRALAZINE HCL 20 MG/ML IJ SOLN: 10.0000 mg | INTRAMUSCULAR | Status: DC | PRN

## 2023-05-03 MED ORDER — TRAZODONE HCL 50 MG PO TABS: 50.0000 mg | ORAL_TABLET | Freq: Every evening | ORAL | Status: DC | PRN

## 2023-05-03 MED ORDER — SENNOSIDES-DOCUSATE SODIUM 8.6-50 MG PO TABS: 1.0000 | ORAL_TABLET | Freq: Every evening | ORAL | Status: DC | PRN

## 2023-05-03 MED ORDER — SODIUM CHLORIDE 0.9 % IV SOLN: INTRAVENOUS | Status: DC

## 2023-05-03 MED ORDER — ONDANSETRON HCL 4 MG/2ML IJ SOLN: 4.0000 mg | Freq: Four times a day (QID) | INTRAMUSCULAR | Status: DC | PRN

## 2023-05-03 MED ORDER — IPRATROPIUM-ALBUTEROL 0.5-2.5 (3) MG/3ML IN SOLN: 3.0000 mL | RESPIRATORY_TRACT | Status: DC | PRN

## 2023-05-03 MED ORDER — GUAIFENESIN 100 MG/5ML PO LIQD: 5.0000 mL | ORAL | Status: DC | PRN

## 2023-05-03 MED ORDER — METOPROLOL TARTRATE 5 MG/5ML IV SOLN: 5.0000 mg | INTRAVENOUS | Status: DC | PRN

## 2023-05-03 SURGICAL SUPPLY — 15 items

## 2023-05-03 NOTE — Transfer of Care (Signed)
Immediate Anesthesia Transfer of Care Note  Patient: Wanda Cortez  Procedure(s) Performed: ESOPHAGOGASTRODUODENOSCOPY (EGD) WITH PROPOFOL  Patient Location: PACU  Anesthesia Type:MAC  Level of Consciousness: drowsy  Airway & Oxygen Therapy: Patient Spontanous Breathing  Post-op Assessment: Report given to RN and Post -op Vital signs reviewed and stable  Post vital signs: Reviewed and stable  Last Vitals:  Vitals Value Taken Time  BP 107/63 05/03/23 1125  Temp    Pulse 82 05/03/23 1127  Resp 13 05/03/23 1127  SpO2 95 % 05/03/23 1127  Vitals shown include unfiled device data.  Last Pain:  Vitals:   05/03/23 1125  TempSrc:   PainSc: Asleep         Complications: No notable events documented.

## 2023-05-03 NOTE — Plan of Care (Signed)
Problem: Education: Goal: Knowledge of General Education information will improve Description: Including pain rating scale, medication(s)/side effects and non-pharmacologic comfort measures Outcome: Progressing   Problem: Health Behavior/Discharge Planning: Goal: Ability to manage health-related needs will improve Outcome: Progressing   Problem: Clinical Measurements: Goal: Ability to maintain clinical measurements within normal limits will improve Outcome: Progressing Goal: Will remain free from infection Outcome: Progressing Goal: Diagnostic test results will improve Outcome: Progressing   Problem: Nutrition: Goal: Adequate nutrition will be maintained Outcome: Progressing

## 2023-05-03 NOTE — Consult Note (Signed)
Reason for Consult: Right upper quadrant pain Referring Physician: Hospital team  Wanda Cortez is an 52 y.o. female.  HPI: Patient seen and examined and her hospital computer chart and our office computer chart reviewed and she has had right upper quadrant pain without any reflux or indigestion fever or nausea or vomiting and her previous workup was pertinent for decreased ejection fraction on HIDA scan but otherwise negative and she has had a previous endoscopy without problems years ago family history is negative for any gallbladder or significant GI issues she has no other complaints  Past Medical History:  Diagnosis Date   Asthma    Bronchitis    Pleurisy    PONV (postoperative nausea and vomiting)    Recurrent upper respiratory infection (URI)    Urticaria     Past Surgical History:  Procedure Laterality Date   APPENDECTOMY     CERVICAL BIOPSY  W/ LOOP ELECTRODE EXCISION     KNEE SURGERY Right 2018    Family History  Problem Relation Age of Onset   Asthma Mother    Allergic rhinitis Mother    Breast cancer Mother        11s   Allergic rhinitis Father    Stroke Father    Hypertension Father    ALS Father 56       negative genetic test   Allergic rhinitis Sister    Arthritis Sister    Allergic rhinitis Sister    Stroke Maternal Grandmother    Uterine cancer Maternal Grandmother    Osteoporosis Maternal Grandmother    Heart disease Paternal Grandmother    Stroke Paternal Grandfather    Heart disease Paternal Grandfather    Allergic rhinitis Nephew    Allergic rhinitis Niece     Social History:  reports that she has never smoked. She has never used smokeless tobacco. She reports that she does not drink alcohol and does not use drugs.  Allergies:  Allergies  Allergen Reactions   Loratadine Palpitations   Meloxicam Anaphylaxis   Other Shortness Of Breath and Cough    almonds, gluten   Wasp Venom Itching and Hives   Yellow Jacket Venom Anaphylaxis   Lorazepam  Nausea And Vomiting   Nsaids    Prednisone Rash    Medications: I have reviewed the patient's current medications.  Results for orders placed or performed during the hospital encounter of 05/01/23 (from the past 48 hour(s))  Lipase, blood     Status: None   Collection Time: 05/01/23 11:02 PM  Result Value Ref Range   Lipase 42 11 - 51 U/L    Comment: Performed at [facility], [address] [place], [place] [address]  Comprehensive metabolic panel     Status: Abnormal   Collection Time: 05/01/23 11:02 PM  Result Value Ref Range   Sodium 140 135 - 145 mmol/L   Potassium 3.7 3.5 - 5.1 mmol/L   Chloride 101 98 - 111 mmol/L   CO2 27 22 - 32 mmol/L   Glucose, Bld 110 (H) 70 - 99 mg/dL    Comment: Glucose reference range applies only to samples taken after fasting for at least 8 hours.   BUN 13 6 - 20 mg/dL   Creatinine, Ser 6.04 0.44 - 1.00 mg/dL   Calcium 9.9 8.9 - 54.0 mg/dL   Total Protein 7.8 6.5 - 8.1 g/dL   Albumin 4.6 3.5 - 5.0 g/dL   AST 18 15 - 41 U/L  ALT 16 0 - 44 U/L   Alkaline Phosphatase 65 38 - 126 U/L   Total Bilirubin 0.6 0.3 - 1.2 mg/dL   GFR, Estimated >16 >10 mL/min    Comment: (NOTE) Calculated using the CKD-EPI Creatinine Equation (2021)    Anion gap 12 5 - 15    Comment: Performed at [facility], [address] [place], [place] [address]  CBC     Status: None   Collection Time: 05/01/23 11:02 PM  Result Value Ref Range   WBC 5.6 4.0 - 10.5 K/uL   RBC 4.62 3.87 - 5.11 MIL/uL   Hemoglobin 14.3 12.0 - 15.0 g/dL   HCT 40.9 81.1 - 91.4 %   MCV 91.8 80.0 - 100.0 fL   MCH 31.0 26.0 - 34.0 pg   MCHC 33.7 30.0 - 36.0 g/dL   RDW 78.2 95.6 - 21.3 %   Platelets 285 150 - 400 K/uL   nRBC 0.0 0.0 - 0.2 %    Comment: Performed at [facility], [address] [place], [place] [address]  Urinalysis, Routine w reflex microscopic -Urine, Clean Catch     Status: None   Collection Time: 05/01/23 11:32 PM  Result Value Ref Range    Color, Urine YELLOW YELLOW   APPearance CLEAR CLEAR   Specific Gravity, Urine 1.010 1.005 - 1.030   pH 6.0 5.0 - 8.0   Glucose, UA NEGATIVE NEGATIVE mg/dL   Hgb urine dipstick NEGATIVE NEGATIVE   Bilirubin Urine NEGATIVE NEGATIVE   Ketones, ur NEGATIVE NEGATIVE mg/dL   Protein, ur NEGATIVE NEGATIVE mg/dL   Nitrite NEGATIVE NEGATIVE   Leukocytes,Ua NEGATIVE NEGATIVE    Comment: Microscopic not done on urines with negative protein, blood, leukocytes, nitrite, or glucose < 500 mg/dL. Performed at [facility], [address] [place], [place] [address]   CBC     Status: Abnormal   Collection Time: 05/03/23  3:17 AM  Result Value Ref Range   WBC 3.9 (L) 4.0 - 10.5 K/uL   RBC 3.83 (L) 3.87 - 5.11 MIL/uL   Hemoglobin 11.6 (L) 12.0 - 15.0 g/dL   HCT 29.5 28.4 - 13.2 %   MCV 94.3 80.0 - 100.0 fL   MCH 30.3 26.0 - 34.0 pg   MCHC 32.1 30.0 - 36.0 g/dL   RDW 44.0 10.2 - 72.5 %   Platelets 235 150 - 400 K/uL   nRBC 0.0 0.0 - 0.2 %    Comment: Performed at [facility], [address] [place], [place] [address]  Basic metabolic panel     Status: None   Collection Time: 05/03/23  3:17 AM  Result Value Ref Range   Sodium 139 135 - 145 mmol/L   Potassium 4.4 3.5 - 5.1 mmol/L   Chloride 107 98 - 111 mmol/L   CO2 24 22 - 32 mmol/L   Glucose, Bld 90 70 - 99 mg/dL    Comment: Glucose reference range applies only to samples taken after fasting for at least 8 hours.   BUN 11 6 - 20 mg/dL   Creatinine, Ser 0.34 0.44 - 1.00 mg/dL   Calcium 8.9 8.9 - 74.2 mg/dL   GFR, Estimated >59 >56 mL/min    Comment: (NOTE) Calculated using the CKD-EPI Creatinine Equation (2021)    Anion gap 8 5 - 15    Comment: Performed at [facility], [address], [place], [place] [address]  HIV Antibody (routine  testing w rflx)     Status: None   Collection Time: 05/03/23  3:17 AM  Result Value Ref Range   HIV Screen 4th Generation wRfx Non Reactive Non Reactive    Comment: Performed at  [facility], [address], [place], [place] [address]    MR ABDOMEN MRCP W WO CONTAST  Result Date: 05/02/2023 CLINICAL DATA:  Right upper quadrant pain. EXAM: MRI ABDOMEN WITHOUT AND WITH CONTRAST (INCLUDING MRCP) TECHNIQUE: Multiplanar multisequence MR imaging of the abdomen was performed both before and after the administration of intravenous contrast. Heavily T2-weighted images of the biliary and pancreatic ducts were obtained, and three-dimensional MRCP images were rendered by post processing. CONTRAST:  7mL GADAVIST GADOBUTROL 1 MMOL/ML IV SOLN COMPARISON:  Ultrasound abdomen from earlier the same day. FINDINGS: Lower chest: Unremarkable MR appearance to the lung bases. No pleural effusion. No pericardial effusion. Normal heart size. Hepatobiliary: The liver is normal in size and configuration. No intrahepatic or extrahepatic bile duct dilatation. No choledocholithiasis. Unremarkable gallbladder. No cholelithiasis or abnormal wall thickening. No pericholecystic fat stranding. Pancreas: No mass, inflammatory changes or other parenchymal abnormality identified. No main pancreatic duct dilation. Spleen: Within normal limits in size and appearance. There is a subcentimeter simple cyst along the posteroinferior aspect. Adrenals/Urinary Tract: Unremarkable adrenal glands. No hydroureteronephrosis. No suspicious renal mass. Stomach/Bowel: Visualized portions within the abdomen are unremarkable. No disproportionate dilation of bowel loops. Vascular/Lymphatic: No pathologically enlarged lymph nodes identified. No abdominal aortic aneurysm demonstrated. No ascites. Other:  None. Musculoskeletal: No suspicious bone lesions identified. IMPRESSION: 1. Unremarkable MRI of the abdomen. 2. No MRI evidence of acute cholecystitis.  No choledocholithiasis. Electronically Signed   By: Jules Schick M.D.   On: 05/02/2023 16:37   US Abdomen Limited RUQ (LIVER/GB)  Result Date: 05/02/2023 CLINICAL DATA:  Right  upper quadrant abdominal pain EXAM: ULTRASOUND ABDOMEN LIMITED RIGHT UPPER QUADRANT COMPARISON:  None Available. FINDINGS: Gallbladder: The gallbladder is partially decompressed contributing to artifactual gallbladder wall thickening. No intraluminal stones or sludge is identified. No pericholecystic fluid identified. The sonographic Eulah Pont sign is reportedly positive, equivocal given the otherwise benign appearance of the gallbladder. Common bile duct: Diameter: 5 mm in proximal diameter, stable Liver: No focal lesion identified. Within normal limits in parenchymal echogenicity. Portal vein is patent on color Doppler imaging with normal direction of blood flow towards the liver. Other: None. IMPRESSION: 1. Normal examination. No definite sonographic evidence of acute cholecystitis. Electronically Signed   By: Helyn Numbers M.D.   On: 05/02/2023 00:53    Review of Systems negative except above Blood pressure 120/70, pulse 87, temperature (!) 97.3 F (36.3 C), temperature source Temporal, resp. rate 13, height 5\' 6"  (1.676 m), weight 70.3 kg, last menstrual period 09/01/2020, SpO2 96%. Physical Exam vital signs stable afebrile no acute distress exam please see preassessment evaluation labs and x-rays reviewed all okay except for CCK HIDA as above  Assessment/Plan: Probably biliary dyskinesia Plan: Will proceed with endoscopy just to be sure with anesthesia assistance and the procedure was discussed with the patient with further workup and plans pending those findings  Jozlynn Plaia E 05/03/2023, 10:49 AM

## 2023-05-03 NOTE — Progress Notes (Signed)
Day of Surgery  Subjective: Continued abdominal pain  ROS: See above, otherwise other systems negative  Objective: Vital signs in last 24 hours: Temp:  [97.3 F (36.3 C)-98.5 F (36.9 C)] 97.7 F (36.5 C) (10/12 1201) Pulse Rate:  [67-96] 67 (10/12 1201) Resp:  [12-20] 16 (10/12 1201) BP: (101-120)/(54-76) 101/54 (10/12 1201) SpO2:  [94 %-100 %] 100 % (10/12 1201) Last BM Date : 05/01/23  Intake/Output from previous day: 10/11 0701 - 10/12 0700 In: 988.4 [P.O.:240; I.V.:748.4] Out: -  Intake/Output this shift: Total I/O In: 100 [I.V.:100] Out: 0   PE: Gen: NAD Resp: nonlabored CV: RRR Abd: RUQ tenderness  Lab Results:  Recent Labs    05/01/23 2302 05/03/23 0317  WBC 5.6 3.9*  HGB 14.3 11.6*  HCT 42.4 36.1  PLT 285 235   BMET Recent Labs    05/01/23 2302 05/03/23 0317  NA 140 139  K 3.7 4.4  CL 101 107  CO2 27 24  GLUCOSE 110* 90  BUN 13 11  CREATININE 0.65 0.70  CALCIUM 9.9 8.9   PT/INR No results for input(s): "LABPROT", "INR" in the last 72 hours. CMP     Component Value Date/Time   NA 139 05/03/2023 0317   K 4.4 05/03/2023 0317   CL 107 05/03/2023 0317   CO2 24 05/03/2023 0317   GLUCOSE 90 05/03/2023 0317   BUN 11 05/03/2023 0317   CREATININE 0.70 05/03/2023 0317   CALCIUM 8.9 05/03/2023 0317   PROT 7.8 05/01/2023 2302   ALBUMIN 4.6 05/01/2023 2302   AST 18 05/01/2023 2302   ALT 16 05/01/2023 2302   ALKPHOS 65 05/01/2023 2302   BILITOT 0.6 05/01/2023 2302   GFRNONAA >60 05/03/2023 0317   GFRAA >60 05/22/2015 1800   Lipase     Component Value Date/Time   LIPASE 42 05/01/2023 2302    Studies/Results: MR ABDOMEN MRCP W WO CONTAST  Result Date: 05/02/2023 CLINICAL DATA:  Right upper quadrant pain. EXAM: MRI ABDOMEN WITHOUT AND WITH CONTRAST (INCLUDING MRCP) TECHNIQUE: Multiplanar multisequence MR imaging of the abdomen was performed both before and after the administration of intravenous contrast. Heavily T2-weighted images  of the biliary and pancreatic ducts were obtained, and three-dimensional MRCP images were rendered by post processing. CONTRAST:  7mL GADAVIST GADOBUTROL 1 MMOL/ML IV SOLN COMPARISON:  Ultrasound abdomen from earlier the same day. FINDINGS: Lower chest: Unremarkable MR appearance to the lung bases. No pleural effusion. No pericardial effusion. Normal heart size. Hepatobiliary: The liver is normal in size and configuration. No intrahepatic or extrahepatic bile duct dilatation. No choledocholithiasis. Unremarkable gallbladder. No cholelithiasis or abnormal wall thickening. No pericholecystic fat stranding. Pancreas: No mass, inflammatory changes or other parenchymal abnormality identified. No main pancreatic duct dilation. Spleen: Within normal limits in size and appearance. There is a subcentimeter simple cyst along the posteroinferior aspect. Adrenals/Urinary Tract: Unremarkable adrenal glands. No hydroureteronephrosis. No suspicious renal mass. Stomach/Bowel: Visualized portions within the abdomen are unremarkable. No disproportionate dilation of bowel loops. Vascular/Lymphatic: No pathologically enlarged lymph nodes identified. No abdominal aortic aneurysm demonstrated. No ascites. Other:  None. Musculoskeletal: No suspicious bone lesions identified. IMPRESSION: 1. Unremarkable MRI of the abdomen. 2. No MRI evidence of acute cholecystitis.  No choledocholithiasis. Electronically Signed   By: Jules Schick M.D.   On: 05/02/2023 16:37   US Abdomen Limited RUQ (LIVER/GB)  Result Date: 05/02/2023 CLINICAL DATA:  Right upper quadrant abdominal pain EXAM: ULTRASOUND ABDOMEN LIMITED RIGHT UPPER QUADRANT COMPARISON:  None Available. FINDINGS: Gallbladder:  The gallbladder is partially decompressed contributing to artifactual gallbladder wall thickening. No intraluminal stones or sludge is identified. No pericholecystic fluid identified. The sonographic Eulah Pont sign is reportedly positive, equivocal given the otherwise  benign appearance of the gallbladder. Common bile duct: Diameter: 5 mm in proximal diameter, stable Liver: No focal lesion identified. Within normal limits in parenchymal echogenicity. Portal vein is patent on color Doppler imaging with normal direction of blood flow towards the liver. Other: None. IMPRESSION: 1. Normal examination. No definite sonographic evidence of acute cholecystitis. Electronically Signed   By: Helyn Numbers M.D.   On: 05/02/2023 00:53    Anti-infectives: Anti-infectives (From admission, onward)    None       Assessment/Plan  52 yo female with RUQ pain radiating to the back and shoulder with HIDA scan that had filling but 0% emptying. MRCP, [place], CT unremarkable. EGD today also unremarkable. She continues to have pain which gets much worse with eating. -I discussed the findings and think it is reasonable to pursue laparoscopic cholecystectomy while she is here. -We discussed that it does not look acutely inflamed, but it is not functioning normally and if she is unable to eat, she will likely fail outpatient treatment. -We discussed the etiology of her pain, we discussed treatment options and recommended surgery. We discussed details of surgery including general anesthesia, laparoscopic approach, identification of cystic duct and common bile duct. Ligation of cystic duct and cystic artery. Possible need for intraoperative cholangiogram or open procedure. Possible risks of common bile duct injury, liver injury, cystic duct leak, bleeding, infection, post-cholecystectomy syndrome. The patient showed good understanding and all questions were answered   I reviewed last 24 h vitals and pain scores, last 48 h intake and output, last 24 h labs and trends, and last 24 h imaging results.  This care required high  level of medical decision making.    LOS: 0 days   De Blanch [facility] 05/03/2023, 4:18 PM Please see Amion for pager number during day hours  7:00am-4:30pm or 7:00am -11:30am on weekends

## 2023-05-03 NOTE — Anesthesia Postprocedure Evaluation (Signed)
Anesthesia Post Note  Patient: Wanda Cortez  Procedure(s) Performed: ESOPHAGOGASTRODUODENOSCOPY (EGD) WITH PROPOFOL     Patient location during evaluation: PACU Anesthesia Type: MAC Level of consciousness: awake and alert Pain management: pain level controlled Vital Signs Assessment: post-procedure vital signs reviewed and stable Respiratory status: spontaneous breathing, nonlabored ventilation, respiratory function stable and patient connected to nasal cannula oxygen Cardiovascular status: stable and blood pressure returned to baseline Postop Assessment: no apparent nausea or vomiting Anesthetic complications: no  No notable events documented.  Last Vitals:  Vitals:   05/03/23 1140 05/03/23 1201  BP: 101/65 (!) 101/54  Pulse: 81 67  Resp: 12 16  Temp:  36.5 C  SpO2: 94% 100%    Last Pain:  Vitals:   05/03/23 1201  TempSrc: Oral  PainSc:                  [name]

## 2023-05-03 NOTE — Anesthesia Preprocedure Evaluation (Signed)
Anesthesia Evaluation  Patient identified by MRN, date of birth, ID band Patient awake    Reviewed: Allergy & Precautions, NPO status , Patient's Chart, lab work & pertinent test results  History of Anesthesia Complications (+) PONV and history of anesthetic complications  Airway Mallampati: I  TM Distance: >3 FB Neck ROM: Full    Dental  (+) Teeth Intact, Dental Advisory Given   Pulmonary asthma    breath sounds clear to auscultation       Cardiovascular hypertension,  Rhythm:Regular Rate:Normal     Neuro/Psych   Anxiety      Neuromuscular disease    GI/Hepatic Neg liver ROS,GERD  ,,  Endo/Other  negative endocrine ROS    Renal/GU negative Renal ROS     Musculoskeletal negative musculoskeletal ROS (+)    Abdominal   Peds  Hematology negative hematology ROS (+)   Anesthesia Other Findings   Reproductive/Obstetrics                             Anesthesia Physical Anesthesia Plan  ASA: 2  Anesthesia Plan: MAC   Post-op Pain Management:    Induction: Intravenous  PONV Risk Score and Plan: 0 and Propofol infusion  Airway Management Planned: Natural Airway and Nasal Cannula  Additional Equipment: None  Intra-op Plan:   Post-operative Plan:   Informed Consent: I have reviewed the patients History and Physical, chart, labs and discussed the procedure including the risks, benefits and alternatives for the proposed anesthesia with the patient or authorized representative who has indicated his/her understanding and acceptance.       Plan Discussed with: CRNA  Anesthesia Plan Comments:        Anesthesia Quick Evaluation

## 2023-05-03 NOTE — Progress Notes (Signed)
PROGRESS NOTE    Wanda Cortez  ZOX:[id number] DOB: 03-26-1971 DOA: 05/01/2023 PCP: Anne Ng, NP   Brief Narrative:  52 year old with history of asthma, HTN, GERD complains of intermittent abdominal pain for 2 weeks especially after eating.  General surgery and GI consulted.  Planning for endoscopy evaluation today   Assessment & Plan:  Principal Problem:   Right upper quadrant abdominal pain    Right upper quadrant abdominal pain MRI overall unremarkable, planning on ERCP today.  Continue IV fluids. Recent outpatient ultrasound HIDA scan showed biliary dyskinesia but no cholecystitis.   DVT prophylaxis: Lovenox Code Status: Full code Family Communication:   Continue hospital stay for ERCP evaluation today   Subjective: Seen at bedside still having right upper quadrant abdominal pain.   Examination:  General exam: Appears calm and comfortable  Respiratory system: Clear to auscultation. Respiratory effort normal. Cardiovascular system: S1 & S2 heard, RRR. No JVD, murmurs, rubs, gallops or clicks. No pedal edema. Gastrointestinal system: Right upper quadrant tender to palpation Central nervous system: Alert and oriented. No focal neurological deficits. Extremities: Symmetric 5 x 5 power. Skin: No rashes, lesions or ulcers Psychiatry: Judgement and insight appear normal. Mood & affect appropriate.      Diet Orders (From admission, onward)     Start     Ordered   05/03/23 0001  Diet NPO time specified  Diet effective midnight        05/02/23 1848            Objective: Vitals:   05/02/23 1330 05/02/23 1557 05/02/23 1946 05/03/23 0415  BP: 102/65 94/69 112/76 114/65  Pulse: 79 75 79 76  Resp: 18 16 20 18   Temp: 97.6 F (36.4 C) 98.4 F (36.9 C) 98.5 F (36.9 C) 98.5 F (36.9 C)  TempSrc: Oral Oral    SpO2: 100% 97% 99% 99%  Weight:      Height:  5\' 6"  (1.676 m)      Intake/Output Summary (Last 24 hours) at 05/03/2023 0835 Last data filed  at 05/03/2023 0415 Gross per 24 hour  Intake 988.35 ml  Output --  Net 988.35 ml   Filed Weights   05/01/23 2250  Weight: 70.3 kg    Scheduled Meds:  enoxaparin (LOVENOX) injection  40 mg Subcutaneous Q24H   ondansetron (ZOFRAN) IV  4 mg Intravenous Q6H   pantoprazole (PROTONIX) IV  40 mg Intravenous Q12H   sodium chloride flush  3 mL Intravenous Q12H   Continuous Infusions:  Nutritional status     Body mass index is 25.02 kg/m.  Data Reviewed:   CBC: Recent Labs  Lab 05/01/23 2302 05/03/23 0317  WBC 5.6 3.9*  HGB 14.3 11.6*  HCT 42.4 36.1  MCV 91.8 94.3  PLT 285 235   Basic Metabolic Panel: Recent Labs  Lab 05/01/23 2302 05/03/23 0317  NA 140 139  K 3.7 4.4  CL 101 107  CO2 27 24  GLUCOSE 110* 90  BUN 13 11  CREATININE 0.65 0.70  CALCIUM 9.9 8.9   GFR: Estimated Creatinine Clearance: 77 mL/min (by C-G formula based on SCr of 0.7 mg/dL). Liver Function Tests: Recent Labs  Lab 05/01/23 2302  AST 18  ALT 16  ALKPHOS 65  BILITOT 0.6  PROT 7.8  ALBUMIN 4.6   Recent Labs  Lab 05/01/23 2302  LIPASE 42   No results for input(s): "AMMONIA" in the last 168 hours. Coagulation Profile: No results for input(s): "INR", "PROTIME" in the last 168  hours. Cardiac Enzymes: No results for input(s): "CKTOTAL", "CKMB", "CKMBINDEX", "TROPONINI" in the last 168 hours. BNP (last 3 results) No results for input(s): "PROBNP" in the last 8760 hours. HbA1C: No results for input(s): "HGBA1C" in the last 72 hours. CBG: No results for input(s): "GLUCAP" in the last 168 hours. Lipid Profile: No results for input(s): "CHOL", "HDL", "LDLCALC", "TRIG", "CHOLHDL", "LDLDIRECT" in the last 72 hours. Thyroid Function Tests: No results for input(s): "TSH", "T4TOTAL", "FREET4", "T3FREE", "THYROIDAB" in the last 72 hours. Anemia Panel: No results for input(s): "VITAMINB12", "FOLATE", "FERRITIN", "TIBC", "IRON", "RETICCTPCT" in the last 72 hours. Sepsis Labs: No results  for input(s): "PROCALCITON", "LATICACIDVEN" in the last 168 hours.  No results found for this or any previous visit (from the past 240 hour(s)).       Radiology Studies: MR ABDOMEN MRCP W WO CONTAST  Result Date: 05/02/2023 CLINICAL DATA:  Right upper quadrant pain. EXAM: MRI ABDOMEN WITHOUT AND WITH CONTRAST (INCLUDING MRCP) TECHNIQUE: Multiplanar multisequence MR imaging of the abdomen was performed both before and after the administration of intravenous contrast. Heavily T2-weighted images of the biliary and pancreatic ducts were obtained, and three-dimensional MRCP images were rendered by post processing. CONTRAST:  7mL GADAVIST GADOBUTROL 1 MMOL/ML IV SOLN COMPARISON:  Ultrasound abdomen from earlier the same day. FINDINGS: Lower chest: Unremarkable MR appearance to the lung bases. No pleural effusion. No pericardial effusion. Normal heart size. Hepatobiliary: The liver is normal in size and configuration. No intrahepatic or extrahepatic bile duct dilatation. No choledocholithiasis. Unremarkable gallbladder. No cholelithiasis or abnormal wall thickening. No pericholecystic fat stranding. Pancreas: No mass, inflammatory changes or other parenchymal abnormality identified. No main pancreatic duct dilation. Spleen: Within normal limits in size and appearance. There is a subcentimeter simple cyst along the posteroinferior aspect. Adrenals/Urinary Tract: Unremarkable adrenal glands. No hydroureteronephrosis. No suspicious renal mass. Stomach/Bowel: Visualized portions within the abdomen are unremarkable. No disproportionate dilation of bowel loops. Vascular/Lymphatic: No pathologically enlarged lymph nodes identified. No abdominal aortic aneurysm demonstrated. No ascites. Other:  None. Musculoskeletal: No suspicious bone lesions identified. IMPRESSION: 1. Unremarkable MRI of the abdomen. 2. No MRI evidence of acute cholecystitis.  No choledocholithiasis. Electronically Signed   By: Jules Schick M.D.    On: 05/02/2023 16:37   US Abdomen Limited RUQ (LIVER/GB)  Result Date: 05/02/2023 CLINICAL DATA:  Right upper quadrant abdominal pain EXAM: ULTRASOUND ABDOMEN LIMITED RIGHT UPPER QUADRANT COMPARISON:  None Available. FINDINGS: Gallbladder: The gallbladder is partially decompressed contributing to artifactual gallbladder wall thickening. No intraluminal stones or sludge is identified. No pericholecystic fluid identified. The sonographic Eulah Pont sign is reportedly positive, equivocal given the otherwise benign appearance of the gallbladder. Common bile duct: Diameter: 5 mm in proximal diameter, stable Liver: No focal lesion identified. Within normal limits in parenchymal echogenicity. Portal vein is patent on color Doppler imaging with normal direction of blood flow towards the liver. Other: None. IMPRESSION: 1. Normal examination. No definite sonographic evidence of acute cholecystitis. Electronically Signed   By: Helyn Numbers M.D.   On: 05/02/2023 00:53           LOS: 0 days   Time spent= 35 mins    Miguel Rota, MD [facility]  If 7PM-7AM, please contact night-coverage  05/03/2023, 8:35 AM

## 2023-05-03 NOTE — Op Note (Signed)
[facility] Patient Name: Wanda Cortez Procedure Date : 05/03/2023 MRN: [id number] Attending MD: Vida Rigger , MD, [id number] Date of Birth: 03/29/1971 CSN: [id number] Age: 52 Admit Type: Inpatient Procedure:                Upper GI endoscopy Indications:              Abdominal pain in the right upper quadrant Providers:                Vida Rigger, MD, Margaree Mackintosh, RN, Harrington Challenger, Technician Referring MD:              Medicines:                Monitored Anesthesia Care Complications:            No immediate complications. Estimated Blood Loss:     Estimated blood loss: none. Procedure:                Pre-Anesthesia Assessment:                           - Prior to the procedure, a History and Physical                            was performed, and patient medications and                            allergies were reviewed. The patient's tolerance of                            previous anesthesia was also reviewed. The risks                            and benefits of the procedure and the sedation                            options and risks were discussed with the patient.                            All questions were answered, and informed consent                            was obtained. Prior Anticoagulants: The patient has                            taken no anticoagulant or antiplatelet agents. ASA                            Grade Assessment: II - A patient with mild systemic                            disease. After reviewing the risks and benefits,  the patient was deemed in satisfactory condition to                            undergo the procedure.                           After obtaining informed consent, the endoscope was                            passed under direct vision. Throughout the                            procedure, the patient's blood pressure, pulse, and                            oxygen  saturations were monitored continuously. The                            [id number]-[id number] ([id number]) Olympus endoscope was introduced                            through the mouth, and advanced to the third part                            of duodenum. The upper GI endoscopy was                            accomplished without difficulty. The patient                            tolerated the procedure well. Scope In: Scope Out: Findings:      The larynx was normal.      A small hiatal hernia was present.      The entire examined stomach was normal.      The duodenal bulb, first portion of the duodenum and second portion of       the duodenum were normal.      A small diverticulum was found in the third portion of the duodenum.      The cardia and gastric fundus were normal on retroflexion.      The exam was otherwise without abnormality. Impression:               - Normal larynx.                           - Small hiatal hernia.                           - Normal stomach.                           - Normal duodenal bulb, first portion of the                            duodenum and second portion of the duodenum.                           -  Duodenal diverticulum.                           - The examination was otherwise normal. Without an                            obvious cause of her pain seen on endoscopy                           - No specimens collected. Recommendation:           - Soft diet today.                           - Continue present medications.                           - Return to GI clinic PRN.                           - Telephone GI clinic if symptomatic PRN. And                            please call me if I can be of any further                            assistance with this hospital stay                           - Refer to a surgeon today for probable lap chole                            with possible IntraOp cholangiogram for biliary                             dyskinesia. Procedure Code(s):        --- Professional ---                           [phone], Esophagogastroduodenoscopy, flexible,                            transoral; diagnostic, including collection of                            specimen(s) by brushing or washing, when performed                            (separate procedure) Diagnosis Code(s):        --- Professional ---                           K44.9, Diaphragmatic hernia without obstruction or                            gangrene  R10.11, Right upper quadrant pain                           K57.10, Diverticulosis of small intestine without                            perforation or abscess without bleeding CPT copyright 2022 [employer]. All rights reserved. The codes documented in this report are preliminary and upon coder review may  be revised to meet current compliance requirements. Vida Rigger, MD 05/03/2023 11:27:44 AM This report has been signed electronically. Number of Addenda: 0

## 2023-05-04 ENCOUNTER — Encounter (HOSPITAL_COMMUNITY)
Admission: EM | Disposition: A | Discharge status: Home / Self Care | Payer: Self-pay | Source: Home / Self Care | Attending: Internal Medicine

## 2023-05-04 ENCOUNTER — Encounter (HOSPITAL_COMMUNITY): Payer: Self-pay | Admitting: Internal Medicine

## 2023-05-04 ENCOUNTER — Inpatient Hospital Stay (HOSPITAL_COMMUNITY): Payer: BC Managed Care – PPO

## 2023-05-04 DIAGNOSIS — R1011 Right upper quadrant pain: Secondary | ICD-10-CM | POA: Diagnosis not present

## 2023-05-04 HISTORY — PX: CHOLECYSTECTOMY: SHX55

## 2023-05-04 LAB — COMPREHENSIVE METABOLIC PANEL
ALT: 25 U/L (ref 0–44)
AST: 20 U/L (ref 15–41)
Albumin: 3.5 g/dL (ref 3.5–5.0)
Alkaline Phosphatase: 46 U/L (ref 38–126)
Anion gap: 9 (ref 5–15)
BUN: 11 mg/dL (ref 6–20)
CO2: 25 mmol/L (ref 22–32)
Calcium: 9.1 mg/dL (ref 8.9–10.3)
Chloride: 106 mmol/L (ref 98–111)
Creatinine, Ser: 0.77 mg/dL (ref 0.44–1.00)
GFR, Estimated: >60 mL/min (ref >60)
Glucose, Bld: 90 mg/dL (ref 70–99)
Potassium: 3.9 mmol/L (ref 3.5–5.1)
Sodium: 140 mmol/L (ref 135–145)
Total Bilirubin: 0.8 mg/dL (ref 0.3–1.2)
Total Protein: 6.5 g/dL (ref 6.5–8.1)

## 2023-05-04 LAB — CBC
HCT: 38 % (ref 36.0–46.0)
Hemoglobin: 12.1 g/dL (ref 12.0–15.0)
MCH: 30 pg (ref 26.0–34.0)
MCHC: 31.8 g/dL (ref 30.0–36.0)
MCV: 94.3 fL (ref 80.0–100.0)
Platelets: 220 10*3/uL (ref 150–400)
RBC: 4.03 MIL/uL (ref 3.87–5.11)
RDW: 11.9 % (ref 11.5–15.5)
WBC: 3.7 10*3/uL — ABNORMAL LOW (ref 4.0–10.5)
nRBC: 0 % (ref 0.0–0.2)

## 2023-05-04 LAB — MAGNESIUM: Magnesium: 1.9 mg/dL (ref 1.7–2.4)

## 2023-05-04 SURGERY — LAPAROSCOPIC CHOLECYSTECTOMY
Anesthesia: General

## 2023-05-04 MED ORDER — LIDOCAINE 2% (20 MG/ML) 5 ML SYRINGE
INTRAMUSCULAR | Status: DC | PRN
  Administered 2023-05-04: 100 mg via INTRAVENOUS

## 2023-05-04 MED ORDER — BUPIVACAINE HCL (PF) 0.25 % IJ SOLN
INTRAMUSCULAR | Status: DC | PRN
Start: 2023-05-04 — End: 2023-05-04
  Administered 2023-05-04: 30 mL

## 2023-05-04 MED ORDER — FENTANYL CITRATE (PF) 250 MCG/5ML IJ SOLN
INTRAMUSCULAR | Status: AC
  Filled 2023-05-04: qty 5

## 2023-05-04 MED ORDER — FENTANYL CITRATE (PF) 100 MCG/2ML IJ SOLN
INTRAMUSCULAR | Status: AC
  Filled 2023-05-04: qty 2

## 2023-05-04 MED ORDER — OXYCODONE HCL 5 MG PO TABS
5.0000 mg | ORAL_TABLET | Freq: Four times a day (QID) | ORAL | Status: DC | PRN
  Administered 2023-05-04: 5 mg via ORAL
  Filled 2023-05-04 (×2): qty 1

## 2023-05-04 MED ORDER — PROPOFOL 10 MG/ML IV BOLUS
INTRAVENOUS | Status: AC
  Filled 2023-05-04: qty 20

## 2023-05-04 MED ORDER — SCOPOLAMINE 1 MG/3DAYS TD PT72
MEDICATED_PATCH | TRANSDERMAL | Status: AC
  Administered 2023-05-04: 1.5 mg via TRANSDERMAL
  Filled 2023-05-04: qty 1

## 2023-05-04 MED ORDER — ONDANSETRON HCL 4 MG/2ML IJ SOLN
INTRAMUSCULAR | Status: DC | PRN
  Administered 2023-05-04: 4 mg via INTRAVENOUS

## 2023-05-04 MED ORDER — ORAL CARE MOUTH RINSE: 15.0000 mL | Freq: Once | OROMUCOSAL | Status: AC

## 2023-05-04 MED ORDER — PROPOFOL 10 MG/ML IV BOLUS
INTRAVENOUS | Status: DC | PRN
  Administered 2023-05-04: 200 mg via INTRAVENOUS

## 2023-05-04 MED ORDER — ROCURONIUM BROMIDE 10 MG/ML (PF) SYRINGE
PREFILLED_SYRINGE | INTRAVENOUS | Status: DC | PRN
  Administered 2023-05-04: 50 mg via INTRAVENOUS

## 2023-05-04 MED ORDER — AMISULPRIDE (ANTIEMETIC) 5 MG/2ML IV SOLN: 10.0000 mg | Freq: Once | INTRAVENOUS | Status: DC | PRN

## 2023-05-04 MED ORDER — ACETAMINOPHEN 500 MG PO TABS
ORAL_TABLET | ORAL | Status: AC
  Filled 2023-05-04: qty 2

## 2023-05-04 MED ORDER — SODIUM CHLORIDE 0.9% FLUSH
10.0000 mL | Freq: Two times a day (BID) | INTRAVENOUS | Status: DC
  Administered 2023-05-04 (×2): 10 mL via INTRAVENOUS

## 2023-05-04 MED ORDER — SODIUM CHLORIDE 0.9 % IV SOLN
INTRAVENOUS | Status: DC | PRN
Start: 2023-05-04 — End: 2023-05-04

## 2023-05-04 MED ORDER — SUGAMMADEX SODIUM 200 MG/2ML IV SOLN
INTRAVENOUS | Status: DC | PRN
  Administered 2023-05-04: 200 mg via INTRAVENOUS

## 2023-05-04 MED ORDER — DEXAMETHASONE SODIUM PHOSPHATE 10 MG/ML IJ SOLN
INTRAMUSCULAR | Status: DC | PRN
  Administered 2023-05-04: 4 mg via INTRAVENOUS

## 2023-05-04 MED ORDER — FENTANYL CITRATE (PF) 250 MCG/5ML IJ SOLN
INTRAMUSCULAR | Status: DC | PRN
  Administered 2023-05-04 (×2): 50 ug via INTRAVENOUS

## 2023-05-04 MED ORDER — ALBUMIN HUMAN 5 % IV SOLN
INTRAVENOUS | Status: DC | PRN
Start: 2023-05-04 — End: 2023-05-04

## 2023-05-04 MED ORDER — SCOPOLAMINE 1 MG/3DAYS TD PT72
1.0000 | MEDICATED_PATCH | TRANSDERMAL | Status: DC
  Filled 2023-05-04: qty 1

## 2023-05-04 MED ORDER — ACETAMINOPHEN 500 MG PO TABS
1000.0000 mg | ORAL_TABLET | Freq: Once | ORAL | Status: AC
  Administered 2023-05-04: 1000 mg via ORAL

## 2023-05-04 MED ORDER — SODIUM CHLORIDE 0.9 % IV SOLN: INTRAVENOUS | Status: DC

## 2023-05-04 MED ORDER — CHLORHEXIDINE GLUCONATE 0.12 % MT SOLN: 15.0000 mL | Freq: Once | OROMUCOSAL | Status: AC

## 2023-05-04 MED ORDER — ONDANSETRON HCL 4 MG/2ML IJ SOLN: 4.0000 mg | Freq: Once | INTRAMUSCULAR | Status: DC | PRN

## 2023-05-04 MED ORDER — CHLORHEXIDINE GLUCONATE 0.12 % MT SOLN
OROMUCOSAL | Status: AC
  Administered 2023-05-04: 15 mL via OROMUCOSAL
  Filled 2023-05-04: qty 15

## 2023-05-04 MED ORDER — OXYCODONE HCL 5 MG PO TABS
ORAL_TABLET | ORAL | Status: AC
  Administered 2023-05-05: 5 mg via ORAL
  Filled 2023-05-04: qty 1

## 2023-05-04 MED ORDER — FENTANYL CITRATE (PF) 100 MCG/2ML IJ SOLN
25.0000 ug | INTRAMUSCULAR | Status: DC | PRN
  Administered 2023-05-04 (×3): 50 ug via INTRAVENOUS

## 2023-05-04 MED ORDER — BUPIVACAINE HCL (PF) 0.25 % IJ SOLN
INTRAMUSCULAR | Status: AC
  Filled 2023-05-04: qty 30

## 2023-05-04 SURGICAL SUPPLY — 38 items
ADH SKN CLS APL DERMABOND .7 (GAUZE/BANDAGES/DRESSINGS) ×1
APL PRP STRL LF DISP 70% ISPRP (MISCELLANEOUS) ×1
APPLIER CLIP ROT 10 11.4 M/L (STAPLE) ×1
APR CLP MED LRG 11.4X10 (STAPLE) ×1
BAG COUNTER SPONGE SURGICOUNT (BAG) ×1 IMPLANT
BAG SPEC RTRVL 10 TROC 200 (ENDOMECHANICALS) ×1
BAG SPNG CNTER NS LX DISP (BAG) ×1
CHLORAPREP W/TINT 26 (MISCELLANEOUS) ×1 IMPLANT
CLIP APPLIE ROT 10 11.4 M/L (STAPLE) IMPLANT
COVER SURGICAL LIGHT HANDLE (MISCELLANEOUS) ×1 IMPLANT
DERMABOND ADVANCED .7 DNX12 (GAUZE/BANDAGES/DRESSINGS) ×1 IMPLANT
ELECT REM PT RETURN 9FT ADLT (ELECTROSURGICAL) ×1
ELECTRODE REM PT RTRN 9FT ADLT (ELECTROSURGICAL) ×1 IMPLANT
GLOVE BIOGEL PI IND STRL 7.0 (GLOVE) ×1 IMPLANT
GLOVE SURG SS PI 7.0 STRL IVOR (GLOVE) ×1 IMPLANT
GOWN STRL REUS W/ TWL LRG LVL3 (GOWN DISPOSABLE) ×3 IMPLANT
GOWN STRL REUS W/TWL LRG LVL3 (GOWN DISPOSABLE) ×3
GRASPER SUT TROCAR 14GX15 (MISCELLANEOUS) ×1 IMPLANT
IRRIG SUCT STRYKERFLOW 2 WTIP (MISCELLANEOUS) ×1
IRRIGATION SUCT STRKRFLW 2 WTP (MISCELLANEOUS) ×1 IMPLANT
KIT BASIN OR (CUSTOM PROCEDURE TRAY) ×1 IMPLANT
KIT TURNOVER KIT B (KITS) ×1 IMPLANT
NDL 22X1.5 STRL (OR ONLY) (MISCELLANEOUS) ×1 IMPLANT
NEEDLE 22X1.5 STRL (OR ONLY) (MISCELLANEOUS) ×1 IMPLANT
NS IRRIG 1000ML POUR BTL (IV SOLUTION) ×1 IMPLANT
PAD ARMBOARD 7.5X6 YLW CONV (MISCELLANEOUS) ×1 IMPLANT
POUCH RETRIEVAL ECOSAC 10 (ENDOMECHANICALS) ×1 IMPLANT
SCISSORS LAP 5X35 DISP (ENDOMECHANICALS) ×1 IMPLANT
SET TUBE SMOKE EVAC HIGH FLOW (TUBING) ×1 IMPLANT
SLEEVE Z-THREAD 5X100MM (TROCAR) ×2 IMPLANT
SPECIMEN JAR SMALL (MISCELLANEOUS) ×1 IMPLANT
SUT MNCRL AB 4-0 PS2 18 (SUTURE) ×1 IMPLANT
TOWEL GREEN STERILE (TOWEL DISPOSABLE) ×1 IMPLANT
TRAY LAPAROSCOPIC MC (CUSTOM PROCEDURE TRAY) ×1 IMPLANT
TROCAR Z THREAD OPTICAL 12X100 (TROCAR) ×1 IMPLANT
TROCAR Z-THREAD OPTICAL 5X100M (TROCAR) ×1 IMPLANT
WARMER LAPAROSCOPE (MISCELLANEOUS) ×1 IMPLANT
WATER STERILE IRR 1000ML POUR (IV SOLUTION) ×1 IMPLANT

## 2023-05-04 NOTE — Progress Notes (Signed)
Pre Procedure note for inpatients:   Wanda Cortez has been scheduled for Procedure(s): LAPAROSCOPIC CHOLECYSTECTOMY (N/A) today. The various methods of treatment have been discussed with the patient. After consideration of the risks, benefits and treatment options the patient has consented to the planned procedure.   The patient has been seen and labs reviewed. There are no changes in the patient's condition to prevent proceeding with the planned procedure today.  Recent labs:  Lab Results  Component Value Date   WBC 3.7 (L) 05/04/2023   HGB 12.1 05/04/2023   HCT 38.0 05/04/2023   PLT 220 05/04/2023   GLUCOSE 90 05/04/2023   CHOL 229 (H) 02/14/2023   TRIG 116.0 02/14/2023   HDL 59.20 02/14/2023   LDLCALC 147 (H) 02/14/2023   ALT 25 05/04/2023   AST 20 05/04/2023   NA 140 05/04/2023   K 3.9 05/04/2023   CL 106 05/04/2023   CREATININE 0.77 05/04/2023   BUN 11 05/04/2023   CO2 25 05/04/2023   TSH 1.49 02/14/2023    Rodman Pickle, MD 05/04/2023 6:59 AM

## 2023-05-04 NOTE — Transfer of Care (Signed)
Immediate Anesthesia Transfer of Care Note  Patient: Wanda Cortez  Procedure(s) Performed: LAPAROSCOPIC CHOLECYSTECTOMY  Patient Location: PACU  Anesthesia Type:General  Level of Consciousness: awake and alert   Airway & Oxygen Therapy: Patient Spontanous Breathing  Post-op Assessment: Report given to RN and Post -op Vital signs reviewed and stable  Post vital signs: Reviewed  Last Vitals:  Vitals Value Taken Time  BP 102/70 05/04/23 0900  Temp 36.3 C 05/04/23 0900  Pulse 70 05/04/23 0906  Resp 10 05/04/23 0905  SpO2 96 % 05/04/23 0906  Vitals shown include unfiled device data.  Last Pain:  Vitals:   05/04/23 0845  TempSrc:   PainSc: 8          Complications: No notable events documented.

## 2023-05-04 NOTE — Anesthesia Postprocedure Evaluation (Signed)
Anesthesia Post Note  Patient: Wanda Cortez  Procedure(s) Performed: LAPAROSCOPIC CHOLECYSTECTOMY     Patient location during evaluation: PACU Anesthesia Type: General Level of consciousness: awake and alert Pain management: pain level controlled Vital Signs Assessment: post-procedure vital signs reviewed and stable Respiratory status: spontaneous breathing, nonlabored ventilation, respiratory function stable and patient connected to nasal cannula oxygen Cardiovascular status: blood pressure returned to baseline and stable Postop Assessment: no apparent nausea or vomiting Anesthetic complications: no   No notable events documented.  Last Vitals:  Vitals:   05/04/23 0830 05/04/23 0900  BP: 104/60 102/70  Pulse: 80 66  Resp: 13 12  Temp:  (!) 36.3 C  SpO2: 99% 97%    Last Pain:  Vitals:   05/04/23 0845  TempSrc:   PainSc: 8                  Naiomy Watters P Latif Nazareno

## 2023-05-04 NOTE — Anesthesia Preprocedure Evaluation (Signed)
Anesthesia Evaluation  Patient identified by MRN, date of birth, ID band Patient awake    Reviewed: Allergy & Precautions, NPO status , Patient's Chart, lab work & pertinent test results  History of Anesthesia Complications (+) PONV and history of anesthetic complications  Airway Mallampati: II  TM Distance: >3 FB Neck ROM: Full    Dental no notable dental hx.    Pulmonary asthma    Pulmonary exam normal        Cardiovascular hypertension,  Rhythm:Regular Rate:Normal     Neuro/Psych   Anxiety     negative neurological ROS     GI/Hepatic Neg liver ROS,GERD  ,,Cholecystitis    Endo/Other  negative endocrine ROS    Renal/GU negative Renal ROS  negative genitourinary   Musculoskeletal   Abdominal Normal abdominal exam  (+)   Peds  Hematology Lab Results      Component                Value               Date                      WBC                      3.7 (L)             05/04/2023                HGB                      12.1                05/04/2023                HCT                      38.0                05/04/2023                MCV                      94.3                05/04/2023                PLT                      220                 05/04/2023              Anesthesia Other Findings   Reproductive/Obstetrics                             Anesthesia Physical Anesthesia Plan  ASA: 2  Anesthesia Plan: General   Post-op Pain Management: Tylenol PO (pre-op)*   Induction: Intravenous  PONV Risk Score and Plan: 4 or greater and Ondansetron, Dexamethasone, Midazolam, Treatment may vary due to age or medical condition, Scopolamine patch - Pre-op and Amisulpride  Airway Management Planned: Blossom and Oral ETT  Additional Equipment: None  Intra-op Plan:   Post-operative Plan: Extubation in OR  Informed Consent: I have reviewed the patients History and Physical, chart, labs  and discussed the procedure including the  risks, benefits and alternatives for the proposed anesthesia with the patient or authorized representative who has indicated his/her understanding and acceptance.     Dental advisory given  Plan Discussed with: CRNA  Anesthesia Plan Comments:        Anesthesia Quick Evaluation

## 2023-05-04 NOTE — Progress Notes (Signed)
PROGRESS NOTE    Wanda Cortez  GLO:[id number] DOB: [birth year] DOA: 05/01/2023 PCP: Anne Ng, NP     Brief Narrative:  52 year old with history of asthma, HTN, GERD complains of intermittent abdominal pain for 2 weeks especially after eating.  General surgery and GI consulted.  Endoscopy was unremarkable.  Patient was seen by general surgery and underwent laparoscopic cholecystectomy on 10/14.     Assessment & Plan:  Principal Problem:   Right upper quadrant abdominal pain    Right upper quadrant abdominal pain secondary to biliary dyskinesia MRCP and ERCP overall unremarkable. HIDA scan had shown biliary dyskinesia. Laparoscopic cholecystectomy today.     DVT prophylaxis: Lovenox Code Status: Full code Family Communication:   Laparoscopic cholecystectomy today           Subjective: Patient seen postoperatively.  Sister and husband are at bedside.  Patient states she is having surgical site pain but overall feels much better   Examination:  General exam: Appears calm and comfortable  Respiratory system: Clear to auscultation. Respiratory effort normal. Cardiovascular system: S1 & S2 heard, RRR. No JVD, murmurs, rubs, gallops or clicks. No pedal edema. Gastrointestinal system: Abdomen is nondistended, soft and nontender. No organomegaly or masses felt. Normal bowel sounds heard. Central nervous system: Alert and oriented. No focal neurological deficits. Extremities: Symmetric 5 x 5 power. Skin: No rashes, lesions or ulcers Psychiatry: Judgement and insight appear normal. Mood & affect appropriate.       Diet Orders (From admission, onward)     Start     Ordered   05/04/23 0925  Diet regular Fluid consistency: Thin  Diet effective now       Question:  Fluid consistency:  Answer:  Thin   05/04/23 0924            Objective: Vitals:   05/04/23 0629 05/04/23 0824 05/04/23 0830 05/04/23 0900  BP:  (!) 101/58 104/60 102/70  Pulse:  90 80 66   Resp:  12 13 12   Temp:  (!) 97.1 F (36.2 C)  (!) 97.4 F (36.3 C)  TempSrc:      SpO2:  99% 99% 97%  Weight: 70.3 kg     Height: 5' 5.98" (1.676 m)       Intake/Output Summary (Last 24 hours) at 05/04/2023 1126 Last data filed at 05/04/2023 0811 Gross per 24 hour  Intake 400 ml  Output 10 ml  Net 390 ml   Filed Weights   05/01/23 2250 05/04/23 0629  Weight: 70.3 kg 70.3 kg    Scheduled Meds:  acetaminophen       enoxaparin (LOVENOX) injection  40 mg Subcutaneous Q24H   fentaNYL       fentaNYL       ondansetron (ZOFRAN) IV  4 mg Intravenous Q6H   oxyCODONE       pantoprazole (PROTONIX) IV  40 mg Intravenous Q12H   scopolamine  1 patch Transdermal Q72H   sodium chloride flush  10 mL Intravenous Q12H   sodium chloride flush  3 mL Intravenous Q12H   Continuous Infusions:  Nutritional status     Body mass index is 25.03 kg/m.  Data Reviewed:   CBC: Recent Labs  Lab 05/01/23 2302 05/03/23 0317 05/04/23 0543  WBC 5.6 3.9* 3.7*  HGB 14.3 11.6* 12.1  HCT 42.4 36.1 38.0  MCV 91.8 94.3 94.3  PLT 285 235 220   Basic Metabolic Panel: Recent Labs  Lab 05/01/23 2302 05/03/23 0317 05/04/23 0543  NA  140 139 140  K 3.7 4.4 3.9  CL 101 107 106  CO2 27 24 25   GLUCOSE 110* 90 90  BUN 13 11 11   CREATININE 0.65 0.70 0.77  CALCIUM 9.9 8.9 9.1  MG  --   --  1.9   GFR: Estimated Creatinine Clearance: 77 mL/min (by C-G formula based on SCr of 0.77 mg/dL). Liver Function Tests: Recent Labs  Lab 05/01/23 2302 05/04/23 0543  AST 18 20  ALT 16 25  ALKPHOS 65 46  BILITOT 0.6 0.8  PROT 7.8 6.5  ALBUMIN 4.6 3.5   Recent Labs  Lab 05/01/23 2302  LIPASE 42   No results for input(s): "AMMONIA" in the last 168 hours. Coagulation Profile: No results for input(s): "INR", "PROTIME" in the last 168 hours. Cardiac Enzymes: No results for input(s): "CKTOTAL", "CKMB", "CKMBINDEX", "TROPONINI" in the last 168 hours. BNP (last 3 results) No results for input(s):  "PROBNP" in the last 8760 hours. HbA1C: No results for input(s): "HGBA1C" in the last 72 hours. CBG: No results for input(s): "GLUCAP" in the last 168 hours. Lipid Profile: No results for input(s): "CHOL", "HDL", "LDLCALC", "TRIG", "CHOLHDL", "LDLDIRECT" in the last 72 hours. Thyroid Function Tests: No results for input(s): "TSH", "T4TOTAL", "FREET4", "T3FREE", "THYROIDAB" in the last 72 hours. Anemia Panel: No results for input(s): "VITAMINB12", "FOLATE", "FERRITIN", "TIBC", "IRON", "RETICCTPCT" in the last 72 hours. Sepsis Labs: No results for input(s): "PROCALCITON", "LATICACIDVEN" in the last 168 hours.  No results found for this or any previous visit (from the past 240 hour(s)).       Radiology Studies: MR ABDOMEN MRCP W WO CONTAST  Result Date: 05/02/2023 CLINICAL DATA:  Right upper quadrant pain. EXAM: MRI ABDOMEN WITHOUT AND WITH CONTRAST (INCLUDING MRCP) TECHNIQUE: Multiplanar multisequence MR imaging of the abdomen was performed both before and after the administration of intravenous contrast. Heavily T2-weighted images of the biliary and pancreatic ducts were obtained, and three-dimensional MRCP images were rendered by post processing. CONTRAST:  7mL GADAVIST GADOBUTROL 1 MMOL/ML IV SOLN COMPARISON:  Ultrasound abdomen from earlier the same day. FINDINGS: Lower chest: Unremarkable MR appearance to the lung bases. No pleural effusion. No pericardial effusion. Normal heart size. Hepatobiliary: The liver is normal in size and configuration. No intrahepatic or extrahepatic bile duct dilatation. No choledocholithiasis. Unremarkable gallbladder. No cholelithiasis or abnormal wall thickening. No pericholecystic fat stranding. Pancreas: No mass, inflammatory changes or other parenchymal abnormality identified. No main pancreatic duct dilation. Spleen: Within normal limits in size and appearance. There is a subcentimeter simple cyst along the posteroinferior aspect. Adrenals/Urinary Tract:  Unremarkable adrenal glands. No hydroureteronephrosis. No suspicious renal mass. Stomach/Bowel: Visualized portions within the abdomen are unremarkable. No disproportionate dilation of bowel loops. Vascular/Lymphatic: No pathologically enlarged lymph nodes identified. No abdominal aortic aneurysm demonstrated. No ascites. Other:  None. Musculoskeletal: No suspicious bone lesions identified. IMPRESSION: 1. Unremarkable MRI of the abdomen. 2. No MRI evidence of acute cholecystitis.  No choledocholithiasis. Electronically Signed   By: Jules Schick M.D.   On: 05/02/2023 16:37           LOS: 1 day   Time spent= 35 mins    Miguel Rota, MD [facility]  If 7PM-7AM, please contact night-coverage  05/04/2023, 11:26 AM

## 2023-05-04 NOTE — Anesthesia Procedure Notes (Signed)
Procedure Name: Intubation Date/Time: 05/04/2023 7:42 AM  Performed by: Sandie Ano, CRNAPre-anesthesia Checklist: Patient identified, Emergency Drugs available, Suction available and Patient being monitored Patient Re-evaluated:Patient Re-evaluated prior to induction Oxygen Delivery Method: Circle System Utilized Preoxygenation: Pre-oxygenation with 100% oxygen Induction Type: IV induction Ventilation: Bueno ventilation without difficulty Laryngoscope Size: Mac and 3 Grade View: Grade I Tube type: Oral Tube size: 7.0 mm Number of attempts: 1 Airway Equipment and Method: Stylet and Oral airway Placement Confirmation: ETT inserted through vocal cords under direct vision, positive ETCO2 and breath sounds checked- equal and bilateral Secured at: 22 cm Tube secured with: Tape Dental Injury: Teeth and Oropharynx as per pre-operative assessment

## 2023-05-04 NOTE — Hospital Course (Signed)
  Brief Narrative:  52 year old with history of asthma, HTN, GERD complains of intermittent abdominal pain for 2 weeks especially after eating.  General surgery and GI consulted.  Endoscopy was unremarkable.  Patient was seen by general surgery and underwent laparoscopic cholecystectomy on 10/14.     Assessment & Plan:  Principal Problem:   Right upper quadrant abdominal pain    Right upper quadrant abdominal pain secondary to biliary dyskinesia MRCP and ERCP overall unremarkable. HIDA scan had shown biliary dyskinesia. Laparoscopic cholecystectomy today.     DVT prophylaxis: Lovenox Code Status: Full code Family Communication:   Laparoscopic cholecystectomy today

## 2023-05-04 NOTE — Plan of Care (Signed)
  Problem: Pain Managment: Goal: General experience of comfort will improve Outcome: Progressing   Problem: Safety: Goal: Ability to remain free from injury will improve Outcome: Progressing   

## 2023-05-04 NOTE — Op Note (Signed)
PATIENT:  Wanda Cortez  52 y.o. female  PRE-OPERATIVE DIAGNOSIS:  Cholecystitis  POST-OPERATIVE DIAGNOSIS:  Cholecystitis  PROCEDURE:  Procedure(s): LAPAROSCOPIC CHOLECYSTECTOMY   SURGEON:  Yarden Hillis, De Blanch, MD   ASSISTANT: none  ANESTHESIA:   local and general  Indications for procedure: [name] is a 52 y.o. female with symptoms of Abdominal pain consistent with gallbladder disease, Confirmed by nuclear medicine study.  Description of procedure: The patient was brought into the operative suite, placed supine. Anesthesia was administered with endotracheal tube. Patient was strapped in place and foot board was secured. All pressure points were offloaded by foam padding. The patient was prepped and draped in the usual sterile fashion.  A periumbilical incision was made and optical entry was used to enter the abdomen. 2 5 mm trocars were placed on in the right lateral space on in the right subcostal space. A 12mm trocar was placed in the subxiphoid space. Marcaine with Epinephrine was infused to the subxiphoid space and lateral upper right abdomen in the transversus abdominis plane. Next the patient was placed in reverse trendelenberg. The gallbladder appeareddilated.   The gallbladder was retracted cephalad and lateral. The peritoneum was reflected off the infundibulum working lateral to medial. The cystic duct and cystic artery were identified and further dissection revealed a critical view. The cystic duct and cystic artery were doubly clipped and ligated.   The gallbladder was removed off the liver bed with cautery. The Gallbladder was placed in a specimen bag. The gallbladder fossa was irrigated and hemostasis was applied with cautery. The gallbladder was removed via the 12mm trocar. The fascial defect was closed with interrupted 0 vicryl suture via laparoscopic trans-fascial suture passer. Pneumoperitoneum was removed, all trocar were removed. All incisions were closed with  4-0 monocryl subcuticular stitch. The patient woke from anesthesia and was brought to PACU in stable condition. All counts were correct  Findings: normal critical view  Specimen: gallbladder  Blood loss: 20 ml  Local anesthesia: 30 ml Marcaine with Epinephrine  Complications: none  PLAN OF CARE: Admit to inpatient   PATIENT DISPOSITION:  PACU - hemodynamically stable.  De Blanch [facility], [place]

## 2023-05-05 ENCOUNTER — Encounter (HOSPITAL_COMMUNITY): Payer: Self-pay | Admitting: General Surgery

## 2023-05-05 LAB — COMPREHENSIVE METABOLIC PANEL
ALT: 31 U/L (ref 0–44)
AST: 25 U/L (ref 15–41)
Albumin: 3.5 g/dL (ref 3.5–5.0)
Alkaline Phosphatase: 46 U/L (ref 38–126)
Anion gap: 13 (ref 5–15)
BUN: 8 mg/dL (ref 6–20)
CO2: 23 mmol/L (ref 22–32)
Calcium: 9.3 mg/dL (ref 8.9–10.3)
Chloride: 105 mmol/L (ref 98–111)
Creatinine, Ser: 0.81 mg/dL (ref 0.44–1.00)
GFR, Estimated: >60 mL/min (ref >60)
Glucose, Bld: 95 mg/dL (ref 70–99)
Potassium: 3.8 mmol/L (ref 3.5–5.1)
Sodium: 141 mmol/L (ref 135–145)
Total Bilirubin: 0.5 mg/dL (ref 0.3–1.2)
Total Protein: 6.1 g/dL — ABNORMAL LOW (ref 6.5–8.1)

## 2023-05-05 LAB — MAGNESIUM: Magnesium: 2 mg/dL (ref 1.7–2.4)

## 2023-05-05 LAB — CBC
HCT: 35.8 % — ABNORMAL LOW (ref 36.0–46.0)
Hemoglobin: 11.8 g/dL — ABNORMAL LOW (ref 12.0–15.0)
MCH: 31 pg (ref 26.0–34.0)
MCHC: 33 g/dL (ref 30.0–36.0)
MCV: 94 fL (ref 80.0–100.0)
Platelets: 238 10*3/uL (ref 150–400)
RBC: 3.81 MIL/uL — ABNORMAL LOW (ref 3.87–5.11)
RDW: 12.1 % (ref 11.5–15.5)
WBC: 6.6 10*3/uL (ref 4.0–10.5)
nRBC: 0 % (ref 0.0–0.2)

## 2023-05-05 MED ORDER — ACETAMINOPHEN 325 MG PO TABS: 650.0000 mg | ORAL_TABLET | Freq: Four times a day (QID) | ORAL | 0 refills | Status: AC

## 2023-05-05 MED ORDER — OXYCODONE HCL 5 MG PO TABS
5.0000 mg | ORAL_TABLET | Freq: Three times a day (TID) | ORAL | 0 refills | Status: AC | PRN
Start: 2023-05-05 — End: 2023-05-09

## 2023-05-05 NOTE — Plan of Care (Signed)
  Problem: Pain Managment: Goal: General experience of comfort will improve Outcome: Progressing   Problem: Safety: Goal: Ability to remain free from injury will improve Outcome: Progressing   

## 2023-05-05 NOTE — Progress Notes (Signed)
Went to go see the patient. She was discharged by Surgery team this morning.    Stephania Fragmin MD [facility]

## 2023-05-05 NOTE — Progress Notes (Signed)
Patient discharged home in stable condition. Verbalizes understanding of all discharge instructions, including home medications and follow up appointments.

## 2023-05-05 NOTE — Discharge Summary (Signed)
Physician Discharge Summary  Patient ID: Wanda Cortez MRN: [id number] DOB/AGE: Dec 10, 1970 52 y.o.  Admit date: 05/01/2023 Discharge date: 05/05/2023  Admission Diagnoses:  Discharge Diagnoses:  Principal Problem:   Right upper quadrant abdominal pain Active Problems:   Abdominal pain   Discharged Condition: stable  Hospital Course: Wanda Cortez is a 52 y/o F who presented to the ED with abdominal pain. Imaging and exam were consistent with acute cholecystitis, and she was taken to the OR for a lap chole. Her recovery was uneventful and she discharged to home on POD 1. At the time of discharge she was tolerating a regular diet, ambulating at baseline, and her pain was well controlled on an oral regimen.   Treatments:  Lap Chole (10/13, Dr. Sheliah Hatch)  Discharge Exam: Blood pressure 99/61, pulse 70, temperature 98 F (36.7 C), temperature source Oral, resp. rate 20, height 5' 5.98" (1.676 m), weight 70.3 kg, last menstrual period 09/01/2020, SpO2 94%. General appearance: alert, cooperative, and appears stated age GI: soft, non-distended, appropriately tender Incision/Wound:port sites clean and dry with dermabond intact  Disposition:    Allergies as of 05/05/2023       Reactions   Loratadine Palpitations   Meloxicam Anaphylaxis   Other Shortness Of Breath, Cough   almonds, gluten   Wasp Venom Itching, Hives   Yellow Jacket Venom Anaphylaxis   Lorazepam Nausea And Vomiting   Nsaids Itching, Swelling   Tingling around lips Swelling top lip   Prednisone Rash        Medication List     TAKE these medications    acetaminophen 325 MG tablet Commonly known as: Tylenol Take 2 tablets (650 mg total) by mouth every 6 (six) hours for 6 days.   albuterol 108 (90 Base) MCG/ACT inhaler Commonly known as: VENTOLIN HFA INHALE ONE TO TWO PUFFS BY MOUTH EVERY 6 HOURS AS NEEDED FOR SHORTNESS OF BREATH OR FOR WHEEZING   amoxicillin-clavulanate 875-125 MG tablet Commonly  known as: AUGMENTIN Take 1 tablet by mouth 2 (two) times daily.   BIOTIN PO Take 1,250 mcg by mouth daily.   BLACK COHOSH-DONG QUAI PO Take 1 tablet by mouth 2 times daily at 12 noon and 4 pm.   EPINEPHrine 0.3 mg/0.3 mL Soaj injection Commonly known as: EPI-PEN epinephrine 0.3 mg/0.3 mL injection, auto-injector What changed:  how much to take how to take this when to take this reasons to take this additional instructions   MULTIPLE VITAMINS-MINERALS ER PO Take 1 tablet by mouth daily.   oxyCODONE 5 MG immediate release tablet Commonly known as: Roxicodone Take 1 tablet (5 mg total) by mouth every 8 (eight) hours as needed for up to 4 days.   Spacer/Aero-Holding [employer] 1 Units by Does not apply route daily at 12 noon.   Vitamin B Complex Tabs Take 1 tablet by mouth daily.   Vitamin C 100 MG Chew Chew 1 tablet by mouth daily.         Signed: Moise Boring 05/05/2023, 9:05 AM

## 2023-05-05 NOTE — Plan of Care (Signed)
Problem: Education: Goal: Knowledge of General Education information will improve Description: Including pain rating scale, medication(s)/side effects and non-pharmacologic comfort measures Outcome: Adequate for Discharge   Problem: Health Behavior/Discharge Planning: Goal: Ability to manage health-related needs will improve Outcome: Adequate for Discharge   Problem: Clinical Measurements: Goal: Ability to maintain clinical measurements within normal limits will improve Outcome: Adequate for Discharge Goal: Will remain free from infection Outcome: Adequate for Discharge Goal: Diagnostic test results will improve Outcome: Adequate for Discharge Goal: Respiratory complications will improve Outcome: Adequate for Discharge Goal: Cardiovascular complication will be avoided Outcome: Adequate for Discharge   Problem: Activity: Goal: Risk for activity intolerance will decrease Outcome: Adequate for Discharge   Problem: Nutrition: Goal: Adequate nutrition will be maintained Outcome: Adequate for Discharge   Problem: Coping: Goal: Level of anxiety will decrease Outcome: Adequate for Discharge   Problem: Elimination: Goal: Will not experience complications related to bowel motility Outcome: Adequate for Discharge Goal: Will not experience complications related to urinary retention Outcome: Adequate for Discharge   Problem: Pain Managment: Goal: General experience of comfort will improve Outcome: Adequate for Discharge   Problem: Safety: Goal: Ability to remain free from injury will improve Outcome: Adequate for Discharge   Problem: Skin Integrity: Goal: Risk for impaired skin integrity will decrease Outcome: Adequate for Discharge    Aerica Rincon Tamera Stands, RN

## 2023-05-05 NOTE — Discharge Instructions (Signed)
Home Care After Gallbladder Removal  Activity  Limit activity for the first 24 hours, then you may return to normal daily activities. Returning to normal daily activities as soon as you can following surgery will enhance recovery time.  No heavy lifting pushing or pulling, anything heavier than 10 pounds (gallon of milk weighs approx. 8.8 pounds) for 2 weeks from surgery date.  Do not mow the lawn, use a vacuum cleaner, or do any other strenuous activities without first consulting your surgical team.  Climb stairs slowly and watch your step.  Walk as often as you feel able to increase strength and endurance.  No driving or operating heavy machinery within 24 hours of taking narcotic pain medication.  Diet  Drink plenty of fluids and eat a light meal on the night of surgery. Some patients may find their appetite is poor for a week or two after surgery. This is a normal result of the stress of surgery-your appetite will return in time.   There are no specific diet restrictions after surgery and you resume regular diet as tolerated. However, you may want to refrain from fatty, heavy, and/or greasy foods and follow a low fat diet for 2-4 weeks to avoid temporary diarrhea and nausea. Slowly add fats back into diet.   If you have diarrhea, try avoiding spicy foods, dairy products, fatty foods, and alcohol. You can also watch to see if specific foods cause it, and stop eating them. If the diarrhea continues for more than 2 weeks, talk to your doctor.  Dressings and Wound Care  Keep your wound or incision site clean and dry.  You may have different types of dressings covering your incisions depending on your operation and your surgeon: o Dermabond/Durabond (skin glue): This will usually remain in place for 10-14 days, then naturally fall off your skin. You may take a shower 24 hrs after surgery, carefully wash, not scrub the incision site with a mild non-scented soap. Pat dry with a soft towel.  Do not pick  or peel skin glue off.  Do not use creams, powder, salves or balms on your incision(s).  What to Expect After Surgery  Moderate discomfort controlled with medications  You may feel pain in one or both shoulders. This pain comes from the gas still left in your belly after the surgery, if you had laparoscopic surgery (several small incisions). The pain should ease over several days to a week. Ambulation will help with this pain.   Minimal drainage from incision  Belly swelling  Feeling fatigue and weak  Loose stools after eating. This may occur for 4-8 weeks; however longer in some cases.   Constipation after surgery is common. Drink plenty fluids and eat a high fiber diet.   Pain Control: Prescribed Non-Narcotic Pain Medication  You will be given two prescriptions.  One will be for prescription strength acetaminophen (i.e. Tylenol).  The vast majority of patients will just need these this medication.  One prescription will be for a 'rescue' prescription of an oral narcotic (oxycodone).  You may fill this if needed.  You will take the acetaminophen every 6 hours as prescribed for the first 6 days after surgery.  You do not need to wake up over night to take it if you are comfortable.  If you are still uncomfortable, you may fill the narcotic prescription of Oxycodone and take as directed.   Pain Control: Over the Counter Medications to take as needed  Colace/Docusate: May be prescribed by your  surgeon to prevent constipation caused by the combination of narcotics, effects of anesthesia, and decreased ambulation.  Hold for loose stools or diarrhea. Take 100 mg 1-2 times a day starting tonight.   Fiber: High fiber foods, extra liquids (water 9-13 cups/day) can also assist with constipation. Examples of high fiber foods are fruit, bran. Prune juice and water are also good liquids to drink.  Milk of Magnesia/Miralax:  If constipated despite take the over the counter stool softeners, you may take Milk  of Magnesia or Miralax as directed on bottle to assist with constipation.     Pepcid/Famotidine: May be prescribed while taking naproxen (Aleve) or other NSAIDs such as ibuprofen (Motrin/Advil) to prevent stomach upset or Acid-reflux symptoms. Take 1 tablet 1-2 times a day.   **Constipation: The first bowel movement may occur anywhere between 1-5 days after surgery.  As long as you are not nauseated or not having significant abdominal pain this variation is acceptable. Narcotic pain medications can cause constipation increasing discomfort; early discontinuation will assist with bowel management.If constipated despite taking stool softeners,  you may take Milk of Magnesia or Miralax as directed on the bottle.     **Home medications: You may restart your home medications as directed by your respective Primary Care Physician or Surgeon.   When to notify your Doctor or Healthcare Team  Sign of Wound Infection   Fever over 100 degrees.  Wound becomes extremely swollen, shows red streaks, warm to the touch, and/or drainage from the incision site or foul-smelling drainage.  Wound edges separate or opens up  Bleeding or bruising   If you have bleeding, apply pressure to the site and hold the pressure firmly for 5 minutes. If the bleeding continues, apply pressure again and call 911. If the bleeding stopped, call your doctor to report it.   Call your doctor or nurse if you have increased bleeding from your site and increased bruising or a lump forms or gets larger under your skin at the site.  Unrelieved Pain    Call your doctor or nurse if your pain gets worse or is not eased 1 hour after taking your pain medicine, or if it is severe and uncontrolled.  Fever, Flu-like symptoms   Fever over 100 degrees and/or chills  Gastrointestinal Symptoms    If you have yellowing of your eyes or skin (jaundice)  If you have dark or rust-colored urine  If your stool is gray colored   If you have nausea and  vomiting that continues more than 24 hours, will not let you keep medicine down and will not let you keep fluids down  Black tarry bowel movements. This can be normal after surgery on the stomach, but should resolve in a day or two.    Call 911 if you suddenly have signs of blood loss such as:  Vomiting blood  Fast heart rate  Feeling faint, sweaty, or blacking out  Passing bright red blood from your rectum  Blood Clot Symptoms   Tender, swollen or reddened areas in your calf muscle or thighs.  Numbness or tingling in your lower leg or calf, or at the top of your leg or groin  Skin on your leg looks pale or blue or feels cold to touch  Chest pain or have trouble breathing, lightheadedness, fast heart rate  Sudden Onset of Symptoms    Call 911 if you suddenly have:  Leg weakness and spasm  Loss of bladder or bowel function  Seizure  Confusion,  severe headache, dizziness or feeling unsteady, problems talking, difficulty swallowing, and/or numbness or muscle weakness as these could be signs of a stroke.   Follow up Appointment Your follow up appointment should be scheduled 2-3 weeks after your surgery date.  If you have not previously scheduled for a follow-up visit you can be scheduled by contacting [phone]

## 2023-05-06 LAB — SURGICAL PATHOLOGY

## 2023-05-08 ENCOUNTER — Other Ambulatory Visit (HOSPITAL_COMMUNITY): Payer: Self-pay | Admitting: Gastroenterology

## 2023-05-08 DIAGNOSIS — R1010 Upper abdominal pain, unspecified: Secondary | ICD-10-CM

## 2023-05-22 DIAGNOSIS — Z9049 Acquired absence of other specified parts of digestive tract: Secondary | ICD-10-CM | POA: Diagnosis not present

## 2023-05-22 DIAGNOSIS — R109 Unspecified abdominal pain: Secondary | ICD-10-CM | POA: Diagnosis not present

## 2023-05-28 ENCOUNTER — Emergency Department (HOSPITAL_BASED_OUTPATIENT_CLINIC_OR_DEPARTMENT_OTHER)
Admission: EM | Admit: 2023-05-28 | Discharge: 2023-05-28 | Disposition: A | Discharge status: Home / Self Care | Payer: BC Managed Care – PPO | Attending: Emergency Medicine | Admitting: Emergency Medicine

## 2023-05-28 ENCOUNTER — Encounter (HOSPITAL_BASED_OUTPATIENT_CLINIC_OR_DEPARTMENT_OTHER): Payer: Self-pay | Admitting: Emergency Medicine

## 2023-05-28 ENCOUNTER — Emergency Department (HOSPITAL_BASED_OUTPATIENT_CLINIC_OR_DEPARTMENT_OTHER): Payer: BC Managed Care – PPO

## 2023-05-28 ENCOUNTER — Other Ambulatory Visit: Payer: Self-pay

## 2023-05-28 DIAGNOSIS — I10 Essential (primary) hypertension: Secondary | ICD-10-CM | POA: Insufficient documentation

## 2023-05-28 DIAGNOSIS — R079 Chest pain, unspecified: Secondary | ICD-10-CM | POA: Diagnosis not present

## 2023-05-28 DIAGNOSIS — M25519 Pain in unspecified shoulder: Secondary | ICD-10-CM | POA: Diagnosis not present

## 2023-05-28 DIAGNOSIS — J45909 Unspecified asthma, uncomplicated: Secondary | ICD-10-CM | POA: Diagnosis not present

## 2023-05-28 DIAGNOSIS — R1013 Epigastric pain: Secondary | ICD-10-CM | POA: Diagnosis not present

## 2023-05-28 DIAGNOSIS — M549 Dorsalgia, unspecified: Secondary | ICD-10-CM | POA: Diagnosis not present

## 2023-05-28 DIAGNOSIS — R0789 Other chest pain: Secondary | ICD-10-CM | POA: Diagnosis not present

## 2023-05-28 DIAGNOSIS — R071 Chest pain on breathing: Secondary | ICD-10-CM | POA: Diagnosis not present

## 2023-05-28 LAB — HEPATIC FUNCTION PANEL
ALT: 26 U/L (ref 0–44)
AST: 25 U/L (ref 15–41)
Albumin: 4.6 g/dL (ref 3.5–5.0)
Alkaline Phosphatase: 69 U/L (ref 38–126)
Bilirubin, Direct: <0.1 mg/dL (ref 0.0–0.2)
Total Bilirubin: 0.8 mg/dL (ref <1.2)
Total Protein: 7.8 g/dL (ref 6.5–8.1)

## 2023-05-28 LAB — CBC
HCT: 42.1 % (ref 36.0–46.0)
Hemoglobin: 13.8 g/dL (ref 12.0–15.0)
MCH: 30.3 pg (ref 26.0–34.0)
MCHC: 32.8 g/dL (ref 30.0–36.0)
MCV: 92.5 fL (ref 80.0–100.0)
Platelets: 324 10*3/uL (ref 150–400)
RBC: 4.55 MIL/uL (ref 3.87–5.11)
RDW: 12.2 % (ref 11.5–15.5)
WBC: 5.1 10*3/uL (ref 4.0–10.5)
nRBC: 0 % (ref 0.0–0.2)

## 2023-05-28 LAB — BASIC METABOLIC PANEL
Anion gap: 13 (ref 5–15)
BUN: 17 mg/dL (ref 6–20)
CO2: 26 mmol/L (ref 22–32)
Calcium: 9.6 mg/dL (ref 8.9–10.3)
Chloride: 99 mmol/L (ref 98–111)
Creatinine, Ser: 0.6 mg/dL (ref 0.44–1.00)
GFR, Estimated: >60 mL/min (ref >60)
Glucose, Bld: 99 mg/dL (ref 70–99)
Potassium: 3.4 mmol/L — ABNORMAL LOW (ref 3.5–5.1)
Sodium: 138 mmol/L (ref 135–145)

## 2023-05-28 LAB — TROPONIN I (HIGH SENSITIVITY)
Troponin I (High Sensitivity): <2 ng/L (ref <18)
Troponin I (High Sensitivity): <2 ng/L (ref <18)

## 2023-05-28 LAB — D-DIMER, QUANTITATIVE: D-Dimer, Quant: <0.27 ug{FEU}/mL (ref 0.00–0.50)

## 2023-05-28 LAB — LIPASE, BLOOD: Lipase: 35 U/L (ref 11–51)

## 2023-05-28 MED ORDER — ACYCLOVIR 400 MG PO TABS: 400.0000 mg | ORAL_TABLET | Freq: Four times a day (QID) | ORAL | 0 refills | Status: DC

## 2023-05-28 MED ORDER — GABAPENTIN 100 MG PO CAPS: 100.0000 mg | ORAL_CAPSULE | Freq: Three times a day (TID) | ORAL | 0 refills | Status: DC

## 2023-05-28 MED ORDER — MORPHINE SULFATE (PF) 4 MG/ML IV SOLN
4.0000 mg | Freq: Once | INTRAVENOUS | Status: AC
  Administered 2023-05-28: 4 mg via INTRAVENOUS
  Filled 2023-05-28: qty 1

## 2023-05-28 MED ORDER — ACETAMINOPHEN 500 MG PO TABS
1000.0000 mg | ORAL_TABLET | Freq: Once | ORAL | Status: AC
  Administered 2023-05-28: 1000 mg via ORAL
  Filled 2023-05-28: qty 2

## 2023-05-28 NOTE — ED Notes (Signed)

## 2023-05-28 NOTE — Discharge Instructions (Addendum)
You have been evaluated for your symptoms.  Fortunately your labs and x-ray of your chest today did not show any concerning finding.  Your symptom is not consistent with a heart attack or blood clots in your lung.  I suspect your symptoms may be due to early signs of shingles.  If you develop a blistering rash in the next couple of days please start taking medication prescribed and follow-up closely with your doctor for further care.  Return if have any concern.

## 2023-05-28 NOTE — ED Provider Notes (Signed)
[facility] AT [facility] Provider Note   CSN: [id number] Arrival date & time: 05/28/23  1330     History  Chief Complaint  Patient presents with   Chest Pain    Wanda Cortez is a 52 y.o. female.  The history is provided by the patient and medical records. No language interpreter was used.  Chest Pain    52 year old female significant history of hypertension, GERD, asthma, anxiety and prior surgical history including cholecystectomy, appendectomy presenting with complaints of chest pain.  Patient developed acute onset of pain to her left side of chest that started approximately 2 hours ago.  She described pain as a sharp stabbing sensation to the left chest right below her left breast radiates towards her back and up towards the upper chest.  Pain is episodic, intense, and she has noted some pleuritic component to it.  She tries taking Benadryl and Tylenol without relief.  She denies any associated fever or chills no lightheadedness or dizziness no nausea vomiting diarrhea constipation no productive cough hemoptysis and no rash.  She denies alcohol or tobacco use.  She had a cholecystectomy done about 2 weeks ago.  She is doing well from that standpoint.  No prior history of PE or DVT.  No significant cardiac history.  Home Medications Prior to Admission medications   Medication Sig Start Date End Date Taking? Authorizing Provider  albuterol (VENTOLIN HFA) 108 (90 Base) MCG/ACT inhaler INHALE ONE TO TWO PUFFS BY MOUTH EVERY 6 HOURS AS NEEDED FOR SHORTNESS OF BREATH OR FOR WHEEZING 01/31/23   Nche, Bonna Gains, NP  amoxicillin-clavulanate (AUGMENTIN) 875-125 MG tablet Take 1 tablet by mouth 2 (two) times daily. 04/29/23   [provider]  Ascorbic Acid (VITAMIN C) 100 MG CHEW Chew 1 tablet by mouth daily.    [provider]  B Complex Vitamins (VITAMIN B COMPLEX) TABS Take 1 tablet by mouth daily.    [provider]  BIOTIN PO  Take 1,250 mcg by mouth daily.    [provider]  BLACK COHOSH-DONG QUAI PO Take 1 tablet by mouth 2 times daily at 12 noon and 4 pm.    [provider]  EPINEPHrine 0.3 mg/0.3 mL IJ SOAJ injection epinephrine 0.3 mg/0.3 mL injection, auto-injector Patient taking differently: Inject 0.3 mg into the muscle as needed for anaphylaxis. 08/28/21   Kozlow, Alvira Philips, MD  MULTIPLE VITAMINS-MINERALS ER PO Take 1 tablet by mouth daily.    [provider]  Spacer/Aero-Holding Chambers DEVI 1 Units by Does not apply route daily at 12 noon. 11/22/22   Nche, Bonna Gains, NP      Allergies    Loratadine, Meloxicam, Other, Wasp venom, Yellow jacket venom, Lorazepam, Nsaids, and Prednisone    Review of Systems   Review of Systems  Cardiovascular:  Positive for chest pain.  All other systems reviewed and are negative.   Physical Exam Updated Vital Signs BP 136/85 (BP Location: Right Arm)   Pulse 89   Temp 98.6 F (37 C)   Resp 18   Ht 5\' 6"  (1.676 m)   Wt 71.2 kg   LMP 09/01/2020   SpO2 100%   BMI 25.34 kg/m  Physical Exam Vitals and nursing note reviewed.  Constitutional:      General: She is not in acute distress.    Appearance: She is well-developed.     Comments: Patient is sitting up in bed holding left side of her chest appears uncomfortable.  HENT:     Head: Atraumatic.  Eyes:     Conjunctiva/sclera: Conjunctivae normal.  Cardiovascular:     Rate and Rhythm: Normal rate and regular rhythm.     Pulses: Normal pulses.     Heart sounds: Normal heart sounds.  Pulmonary:     Effort: Pulmonary effort is normal.     Breath sounds: No wheezing, rhonchi or rales.  Abdominal:     Palpations: Abdomen is soft.     Tenderness: There is no abdominal tenderness.  Musculoskeletal:        General: Normal range of motion.     Cervical back: Neck supple.  Skin:    General: Skin is warm.     Capillary Refill: Capillary refill takes less than 2 seconds.     Findings:  No rash.  Neurological:     Mental Status: She is alert.  Psychiatric:        Mood and Affect: Mood normal.     ED Results / Procedures / Treatments   Labs (all labs ordered are listed, but only abnormal results are displayed) Labs Reviewed  BASIC METABOLIC PANEL - Abnormal; Notable for the following components:      Result Value   Potassium 3.4 (*)    All other components within normal limits  CBC  LIPASE, BLOOD  HEPATIC FUNCTION PANEL  D-DIMER, QUANTITATIVE  TROPONIN I (HIGH SENSITIVITY)  TROPONIN I (HIGH SENSITIVITY)    EKG None ED ECG REPORT   Date: 05/28/2023  Rate: 92  Rhythm: normal sinus rhythm  QRS Axis: normal  Intervals: normal  ST/T Wave abnormalities: nonspecific ST changes  Conduction Disutrbances:none  Narrative Interpretation:   Old EKG Reviewed: unchanged  I have personally reviewed the EKG tracing and agree with the computerized printout as noted.   Radiology DG Chest 2 View  Result Date: 05/28/2023 CLINICAL DATA:  Left-sided epigastric pain radiating to left shoulder and back, pain with inspiration EXAM: CHEST - 2 VIEW COMPARISON:  03/26/2023 FINDINGS: Frontal and lateral views of the chest demonstrate an unremarkable cardiac silhouette. No acute airspace disease, effusion, or pneumothorax. No acute bony abnormalities. IMPRESSION: 1. No acute intrathoracic process. Electronically Signed   By: Sharlet Salina M.D.   On: 05/28/2023 17:10    Procedures Procedures    Medications Ordered in ED Medications  morphine (PF) 4 MG/ML injection 4 mg (4 mg Intravenous Given 05/28/23 1445)  acetaminophen (TYLENOL) tablet 1,000 mg (1,000 mg Oral Given 05/28/23 1645)    ED Course/ Medical Decision Making/ A&P                                 Medical Decision Making Amount and/or Complexity of Data Reviewed Labs: ordered. Radiology: ordered.  Risk OTC drugs. Prescription drug management.   BP 136/85 (BP Location: Right Arm)   Pulse 89   Temp 98.6  F (37 C)   Resp 18   Ht 5\' 6"  (1.676 m)   Wt 71.2 kg   LMP 09/01/2020   SpO2 100%   BMI 25.34 kg/m   31:67 PM  52 year old female significant history of hypertension, GERD, asthma, anxiety and prior surgical history including cholecystectomy, appendectomy presenting with complaints of chest pain.  Patient developed acute onset of pain to her left side of chest that started approximately 2 hours ago.  She described pain as a sharp stabbing sensation to the left chest right below her left breast radiates towards  her back and up towards the upper chest.  Pain is episodic, intense, and she has noted some pleuritic component to it.  She tries taking Benadryl and Tylenol without relief.  She denies any associated fever or chills no lightheadedness or dizziness no nausea vomiting diarrhea constipation no productive cough hemoptysis and no rash.  She denies alcohol or tobacco use.  She had a cholecystectomy done about 2 weeks ago.  She is doing well from that standpoint.  No prior history of PE or DVT.  No significant cardiac history.  Exam remarkable for patient appears uncomfortable and holding left side of her chest right below her left breast.  Skin exam without any concerning rash.  No significant tenderness to palpation over the chest wall.  Heart with normal rate and rhythm, lungs are clear to auscultation abdomen is soft and nontender.  Vital sign overall reassuring no fever no hypoxia.  -Labs ordered, independently viewed and interpreted by me.  Labs remarkable for negative d-dimer, doubt PE.  Normal hepatic function panel and lipase, doubt pancreatitis, electrolytes are reassuring, normal WBC.  Normal first trop.   -The patient was maintained on a cardiac monitor.  I personally viewed and interpreted the cardiac monitored which showed an underlying rhythm of: NSR -Imaging independently viewed and interpreted by me and result without acute changes.  Radiologist have not read the xray -This patient  presents to the ED for concern of chest pain, this involves an extensive number of treatment options, and is a complaint that carries with it a high risk of complications and morbidity.  The differential diagnosis includes shingles, MSK, MI, PE, PTX, PNA, GERD, pleurisy -Co morbidities that complicate the patient evaluation includes asthma, pleurisy -Treatment includes morphine -Reevaluation of the patient after these medicines showed that the patient improved -PCP office notes or outside notes reviewed -Escalation to admission/observation considered: patients feels much better, is comfortable with discharge, and will follow up with PCP -Prescription medication considered, patient comfortable with acyclovir and gabapentin -Social Determinant of Health considered   5:10 PM Workup overall reassuring, low suspicion for PE or MI and labs reassuring as well.  Chest x-ray without concerning finding.  I suspect her pain may be early signs of shingles.  I will provide acyclovir and gabapentin prophylactically encourage patient to start taking the medication if she develop a rash.  Otherwise she will follow-up closely with her PCP for further care.  Return precaution given.         Final Clinical Impression(s) / ED Diagnoses Final diagnoses:  Nonspecific chest pain    Rx / DC Orders ED Discharge Orders          Ordered    acyclovir (ZOVIRAX) 400 MG tablet  4 times daily        05/28/23 1712    gabapentin (NEURONTIN) 100 MG capsule  3 times daily        05/28/23 1712              Fayrene Helper, PA-C 05/28/23 1713    Rolan Bucco, MD 05/31/23 [phone]

## 2023-05-28 NOTE — ED Triage Notes (Signed)
Pt having left sided epigastric pain radiating to left shoulder and back.  Pt recently had gallbladder surgery in October.  No N/V.  Pain with deep breath.  No leg pain.

## 2023-06-03 DIAGNOSIS — H04123 Dry eye syndrome of bilateral lacrimal glands: Secondary | ICD-10-CM | POA: Insufficient documentation

## 2023-06-03 DIAGNOSIS — H524 Presbyopia: Secondary | ICD-10-CM | POA: Insufficient documentation

## 2023-06-03 DIAGNOSIS — Z83511 Family history of glaucoma: Secondary | ICD-10-CM | POA: Insufficient documentation

## 2023-06-03 DIAGNOSIS — H52203 Unspecified astigmatism, bilateral: Secondary | ICD-10-CM | POA: Insufficient documentation

## 2023-06-04 ENCOUNTER — Encounter: Payer: Self-pay | Admitting: Nurse Practitioner

## 2023-06-06 ENCOUNTER — Ambulatory Visit: Payer: BC Managed Care – PPO | Admitting: Nurse Practitioner

## 2023-06-06 ENCOUNTER — Encounter: Payer: Self-pay | Admitting: Nurse Practitioner

## 2023-06-06 VITALS — BP 133/77 | HR 86 | Temp 98.7°F | Resp 18 | Wt 164.0 lb

## 2023-06-06 DIAGNOSIS — G8929 Other chronic pain: Secondary | ICD-10-CM | POA: Insufficient documentation

## 2023-06-06 DIAGNOSIS — R0789 Other chest pain: Secondary | ICD-10-CM | POA: Diagnosis not present

## 2023-06-06 NOTE — Progress Notes (Signed)
Established Patient Visit  Patient: Wanda Cortez   DOB: [birth year]   52 y.o. Female  MRN: [id number] Visit Date: 06/06/2023  Subjective:    Chief Complaint  Patient presents with   Pain Management    PT C/O of chest pains that come and go. PT would like to have brain storming ideas for prevent recurring issue. No concerns or issues today. PT had scans, labs for concerns results were normal. PT is due for colonoscopy.    Hospitalization Follow-up   HPI Chronic chest wall pain Onset in college, intermittent chest wall pain, alternate sides, occur randomly, worse with palpation and deep breaths. Improves with repositioning, cold compress, and minimal use of tylenol. CXR 05/28/2023: normal MRI ABDOMEN 04/2023: Unremarkable MRI of the abdomen. No MRI evidence of acute cholecystitis. No choledocholithiasis. Normal cbc, bmp, lipase, D-dimer and troponin 05/28/23  Most likely musculoskeletal pain Unable to use NSAIDs Advised to use tylenol or gabapentin for pain She plans to schedule appointment with Physiotherapist  Reviewed medical, surgical, and social history today  Medications: Outpatient Medications Prior to Visit  Medication Sig   acyclovir (ZOVIRAX) 400 MG tablet Take 1 tablet (400 mg total) by mouth 4 (four) times daily.   albuterol (VENTOLIN HFA) 108 (90 Base) MCG/ACT inhaler INHALE ONE TO TWO PUFFS BY MOUTH EVERY 6 HOURS AS NEEDED FOR SHORTNESS OF BREATH OR FOR WHEEZING   Ascorbic Acid (VITAMIN C) 100 MG CHEW Chew 1 tablet by mouth daily.   B Complex Vitamins (VITAMIN B COMPLEX) TABS Take 1 tablet by mouth daily.   BIOTIN PO Take 1,250 mcg by mouth daily.   BLACK COHOSH-DONG QUAI PO Take 1 tablet by mouth 2 times daily at 12 noon and 4 pm.   EPINEPHrine 0.3 mg/0.3 mL IJ SOAJ injection epinephrine 0.3 mg/0.3 mL injection, auto-injector (Patient taking differently: Inject 0.3 mg into the muscle as needed for anaphylaxis.)   MULTIPLE VITAMINS-MINERALS ER PO Take 1  tablet by mouth daily.   Spacer/Aero-Holding Chambers DEVI 1 Units by Does not apply route daily at 12 noon.   amoxicillin-clavulanate (AUGMENTIN) 875-125 MG tablet Take 1 tablet by mouth 2 (two) times daily.   gabapentin (NEURONTIN) 100 MG capsule Take 1 capsule (100 mg total) by mouth 3 (three) times daily. (Patient not taking: Reported on 06/06/2023)   No facility-administered medications prior to visit.   Reviewed past medical and social history.   ROS per HPI above      Objective:  BP 133/77 (BP Location: Left Arm, Patient Position: Sitting, Cuff Size: Normal)   Pulse 86   Temp 98.7 F (37.1 C) (Temporal)   Resp 18   Wt 164 lb (74.4 kg)   LMP 09/01/2020   SpO2 97%   BMI 26.47 kg/m      Physical Exam Vitals and nursing note reviewed.  Cardiovascular:     Rate and Rhythm: Normal rate and regular rhythm.     Pulses: Normal pulses.     Heart sounds: Normal heart sounds.  Pulmonary:     Effort: Pulmonary effort is normal.     Breath sounds: Normal breath sounds.  Chest:     Chest wall: No tenderness.  Skin:    General: Skin is warm and dry.     Findings: No rash.  Neurological:     Mental Status: She is alert and oriented to person, place, and time.     No results  found for any visits on 06/06/23.    Assessment & Plan:    Problem List Items Addressed This Visit     Chronic chest wall pain - Primary    Onset in college, intermittent chest wall pain, alternate sides, occur randomly, worse with palpation and deep breaths. Improves with repositioning, cold compress, and minimal use of tylenol. CXR 05/28/2023: normal MRI ABDOMEN 04/2023: Unremarkable MRI of the abdomen. No MRI evidence of acute cholecystitis. No choledocholithiasis. Normal cbc, bmp, lipase, D-dimer and troponin 05/28/23  Most likely musculoskeletal pain Unable to use NSAIDs Advised to use tylenol or gabapentin for pain She plans to schedule appointment with Physiotherapist      Return if  symptoms worsen or fail to improve.     Alysia Penna, NP

## 2023-06-06 NOTE — Assessment & Plan Note (Signed)
Onset in college, intermittent chest wall pain, alternate sides, occur randomly, worse with palpation and deep breaths. Improves with repositioning, cold compress, and minimal use of tylenol. CXR 05/28/2023: normal MRI ABDOMEN 04/2023: Unremarkable MRI of the abdomen. No MRI evidence of acute cholecystitis. No choledocholithiasis. Normal cbc, bmp, lipase, D-dimer and troponin 05/28/23  Most likely musculoskeletal pain Unable to use NSAIDs Advised to use tylenol or gabapentin for pain She plans to schedule appointment with Physiotherapist

## 2023-06-06 NOTE — Patient Instructions (Addendum)
[facility] in [place], [place] [address] [facility]-Jeremy Phillips [phone]  Ok to use tylenol 500-650mg  every 8hrs as needed or gabapentin as prescribed for pain management

## 2023-06-12 DIAGNOSIS — H2142 Pupillary membranes, left eye: Secondary | ICD-10-CM | POA: Diagnosis not present

## 2023-06-12 DIAGNOSIS — Z83511 Family history of glaucoma: Secondary | ICD-10-CM | POA: Diagnosis not present

## 2023-06-12 DIAGNOSIS — H02834 Dermatochalasis of left upper eyelid: Secondary | ICD-10-CM | POA: Diagnosis not present

## 2023-06-12 DIAGNOSIS — H02831 Dermatochalasis of right upper eyelid: Secondary | ICD-10-CM | POA: Diagnosis not present

## 2023-07-04 DIAGNOSIS — N941 Unspecified dyspareunia: Secondary | ICD-10-CM | POA: Diagnosis not present

## 2023-07-04 DIAGNOSIS — Z1339 Encounter for screening examination for other mental health and behavioral disorders: Secondary | ICD-10-CM | POA: Diagnosis not present

## 2023-07-04 DIAGNOSIS — N952 Postmenopausal atrophic vaginitis: Secondary | ICD-10-CM | POA: Diagnosis not present

## 2023-07-04 DIAGNOSIS — N951 Menopausal and female climacteric states: Secondary | ICD-10-CM | POA: Diagnosis not present

## 2023-08-06 ENCOUNTER — Encounter: Payer: Self-pay | Admitting: Nurse Practitioner

## 2023-08-12 ENCOUNTER — Encounter: Payer: Self-pay | Admitting: Internal Medicine

## 2023-08-12 ENCOUNTER — Ambulatory Visit: Payer: BC Managed Care – PPO | Admitting: Internal Medicine

## 2023-08-12 VITALS — BP 106/72 | HR 77 | Temp 98.2°F | Ht 66.0 in | Wt 166.2 lb

## 2023-08-12 DIAGNOSIS — J020 Streptococcal pharyngitis: Secondary | ICD-10-CM | POA: Diagnosis not present

## 2023-08-12 LAB — POCT RAPID STREP A (OFFICE): Rapid Strep A Screen: POSITIVE — AB

## 2023-08-12 MED ORDER — PENICILLIN V POTASSIUM 500 MG PO TABS
500.0000 mg | ORAL_TABLET | Freq: Two times a day (BID) | ORAL | 0 refills | Status: AC
Start: 2023-08-12 — End: 2023-08-22

## 2023-08-12 NOTE — Progress Notes (Signed)
[facility] [address] [place] [address] Dept: [phone] Dept Fax: [phone]  Acute Care Office Visit  Subjective:   Wanda Cortez [birth year] 08/12/2023  Chief Complaint  Patient presents with   Sore Throat    Notice 3-4 days ago     HPI: Discussed the use of AI scribe software for clinical note transcription with the patient, who gave verbal consent to proceed.  History of Present Illness   The patient presents with a left-sided sore throat and earache onset 5 days ago. She initially experienced extreme fatigue, bronchial cough, and gastrointestinal upset, which started five days ago. The sore throat has progressively worsened, causing discomfort when swallowing certain foods. The patient also reports a fever of 100.63F on the second day of symptoms, which has since resolved. The earache, however, has persisted. The patient has been experiencing hearing loss for about a week prior to the onset of these symptoms and has an upcoming appointment for this issue. Over-the-counter treatments, including acetaminophen, have provided minimal relief.         The following portions of the patient's history were reviewed and updated as appropriate: past medical history, past surgical history, family history, social history, allergies, medications, and problem list.   Patient Active Problem List   Diagnosis Date Noted   Chronic chest wall pain 06/06/2023   Astigmatism of both eyes with presbyopia 06/03/2023   Dry eye syndrome of both eyes 06/03/2023   Family history of glaucoma 06/03/2023   Abdominal pain 05/03/2023   Right upper quadrant abdominal pain 05/02/2023   Anterior pleuritic pain 03/26/2023   Situational anxiety 02/27/2023   GERD (gastroesophageal reflux disease) 11/22/2022   Family history of breast cancer gene mutation in first degree relative 11/22/2022   Family history of uterine cancer 10/03/2022    Tendinopathy of right rotator cuff 06/20/2022   Asthma 06/07/2021   Food allergy 06/07/2021   Shoulder effusion, right 03/21/2021   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/21/2021   Cervical radiculopathy 03/16/2021   Menopausal flushing 07/06/2020   Lipoma of left lower extremity 01/04/2020   History of cervical cancer 07/12/2019   Tear of right scapholunate ligament 03/30/2019   Essential hypertension 02/25/2019   Chronic pain of right knee 09/22/2018   Acute lateral meniscus tear of left knee 06/10/2017   Submucous leiomyoma of uterus 03/21/2016   Uterine leiomyoma 03/21/2016   Allergy to insect stings 12/27/2014   Past Medical History:  Diagnosis Date   Asthma    Bronchitis    Pleurisy    PONV (postoperative nausea and vomiting)    Recurrent upper respiratory infection (URI)    Urticaria    Past Surgical History:  Procedure Laterality Date   APPENDECTOMY     CERVICAL BIOPSY  W/ LOOP ELECTRODE EXCISION     CHOLECYSTECTOMY N/A 05/04/2023   Procedure: LAPAROSCOPIC CHOLECYSTECTOMY;  Surgeon: Rodman Pickle, MD;  Location: [facility] OR;  Service: General;  Laterality: N/A;   ESOPHAGOGASTRODUODENOSCOPY (EGD) WITH PROPOFOL N/A 05/03/2023   Procedure: ESOPHAGOGASTRODUODENOSCOPY (EGD) WITH PROPOFOL;  Surgeon: Vida Rigger, MD;  Location: [facility] ENDOSCOPY;  Service: Gastroenterology;  Laterality: N/A;   KNEE SURGERY Right 2018   Family History  Problem Relation Age of Onset   Asthma Mother    Allergic rhinitis Mother    Breast cancer Mother        60s   Allergic rhinitis Father    Stroke Father    Hypertension Father  ALS Father 52       negative genetic test   Allergic rhinitis Sister    Arthritis Sister    Allergic rhinitis Sister    Stroke Maternal Grandmother    Uterine cancer Maternal Grandmother    Osteoporosis Maternal Grandmother    Heart disease Paternal Grandmother    Stroke Paternal Grandfather    Heart disease Paternal Grandfather    Allergic rhinitis  Nephew    Allergic rhinitis Niece     Current Outpatient Medications:    albuterol (VENTOLIN HFA) 108 (90 Base) MCG/ACT inhaler, INHALE ONE TO TWO PUFFS BY MOUTH EVERY 6 HOURS AS NEEDED FOR SHORTNESS OF BREATH OR FOR WHEEZING, Disp: 8.5 g, Rfl: 0   Ascorbic Acid (VITAMIN C) 100 MG CHEW, Chew 1 tablet by mouth daily., Disp: , Rfl:    B Complex Vitamins (VITAMIN B COMPLEX) TABS, Take 1 tablet by mouth daily., Disp: , Rfl:    BIOTIN PO, Take 1,250 mcg by mouth daily., Disp: , Rfl:    BLACK COHOSH-DONG QUAI PO, Take 1 tablet by mouth 2 times daily at 12 noon and 4 pm., Disp: , Rfl:    EPINEPHrine 0.3 mg/0.3 mL IJ SOAJ injection, epinephrine 0.3 mg/0.3 mL injection, auto-injector (Patient taking differently: Inject 0.3 mg into the muscle as needed for anaphylaxis.), Disp: 1 each, Rfl: 2   MULTIPLE VITAMINS-MINERALS ER PO, Take 1 tablet by mouth daily., Disp: , Rfl:    penicillin v potassium (VEETID) 500 MG tablet, Take 1 tablet (500 mg total) by mouth 2 (two) times daily for 10 days., Disp: 20 tablet, Rfl: 0   Spacer/Aero-Holding Chambers DEVI, 1 Units by Does not apply route daily at 12 noon., Disp: 1 Units, Rfl: 0   acyclovir (ZOVIRAX) 400 MG tablet, Take 1 tablet (400 mg total) by mouth 4 (four) times daily. (Patient not taking: Reported on 08/12/2023), Disp: 50 tablet, Rfl: 0   gabapentin (NEURONTIN) 100 MG capsule, Take 1 capsule (100 mg total) by mouth 3 (three) times daily. (Patient not taking: Reported on 08/12/2023), Disp: 60 capsule, Rfl: 0 Allergies  Allergen Reactions   Loratadine Palpitations   Meloxicam Anaphylaxis   Other Cough and Shortness Of Breath    almonds, gluten   Wasp Venom Itching and Hives   Yellow Jacket Venom Anaphylaxis   Lorazepam Nausea And Vomiting   Nsaids Itching and Swelling    Tingling around lips Swelling top lip   Prednisone Rash     ROS: A complete ROS was performed with pertinent positives/negatives noted in the HPI. The remainder of the ROS are  negative.    Objective:   Today's Vitals   08/12/23 0955  BP: 106/72  Pulse: 77  Temp: 98.2 F (36.8 C)  TempSrc: Temporal  SpO2: 100%  Weight: 166 lb 3.2 oz (75.4 kg)  Height: 5\' 6"  (1.676 m)    GENERAL: Well-appearing, in NAD. Well nourished.  SKIN: Pink, warm and dry. No rash.  HEENT:    HEAD: Normocephalic, non-traumatic.  EYES: Conjunctive pink without exudate.  EARS: External ear w/o redness, swelling, masses, or lesions. EAC clear. TM's intact, translucent w/o bulging, appropriate landmarks visualized.  NOSE: Septum midline w/o deformity. Nares patent, mucosa pink and non-inflamed w/o drainage. THROAT: Uvula midline. Oropharynx clear. Tonsils 2+ bilaterally, erythematous, w/o exudate . Mucus membranes pink and moist.  NECK: Trachea midline. Full ROM w/o pain or tenderness. No lymphadenopathy.  RESPIRATORY: Chest wall symmetrical. Respirations even and non-labored. Breath sounds clear to auscultation bilaterally.  CARDIAC:  S1, S2 present, regular rate and rhythm. Peripheral pulses 2+ bilaterally.  EXTREMITIES: Without clubbing, cyanosis, or edema.  NEUROLOGIC: Steady, even gait.  PSYCH/MENTAL STATUS: Alert, oriented x 3. Cooperative, appropriate mood and affect.    Results for orders placed or performed in visit on 08/12/23  POCT rapid strep A  Result Value Ref Range   Rapid Strep A Screen Positive (A) Negative      Assessment & Plan:  Assessment and Plan    Streptococcal Pharyngitis Positive rapid strep test. Left-sided throat pain and earache. No white patches observed on tonsils. Tonsils are swollen and throat is red. -Start Penicillin VK 500mg  BID for 10 days. -Continue Tylenol as needed for pain. -Advise salt water gargles, throat lozenges, chloraseptic spray, and honey to soothe sore throat. -Change toothbrush on day 2 after starting antibiotics. -Follow up if symptoms worsen or fail to improve.       Meds ordered this encounter  Medications    penicillin v potassium (VEETID) 500 MG tablet    Sig: Take 1 tablet (500 mg total) by mouth 2 (two) times daily for 10 days.    Dispense:  20 tablet    Refill:  0    Supervising Provider:   Garnette Gunner [[id number]]   Orders Placed This Encounter  Procedures   POCT rapid strep A   Lab Orders         POCT rapid strep A     No images are attached to the encounter or orders placed in the encounter.  Return if symptoms worsen or fail to improve.   Salvatore Decent, FNP

## 2023-08-12 NOTE — Patient Instructions (Addendum)
Sore throat:  Warm salt water gargles  Chloraseptic spray  Throat lozenges  Tylenol   Change toothbrush after 2 days on antibiotics

## 2023-08-23 IMAGING — DX DG RIBS W/ CHEST 3+V*L*
3 series · 3 of 3 positions shown · non-contrast
Comparison: Chest radiograph on 10/13/2020

CLINICAL DATA: Blunt trauma to left chest several days ago. Left
rib pain.

EXAM:
LEFT RIBS AND CHEST - 3+ VIEW

[rib ap]
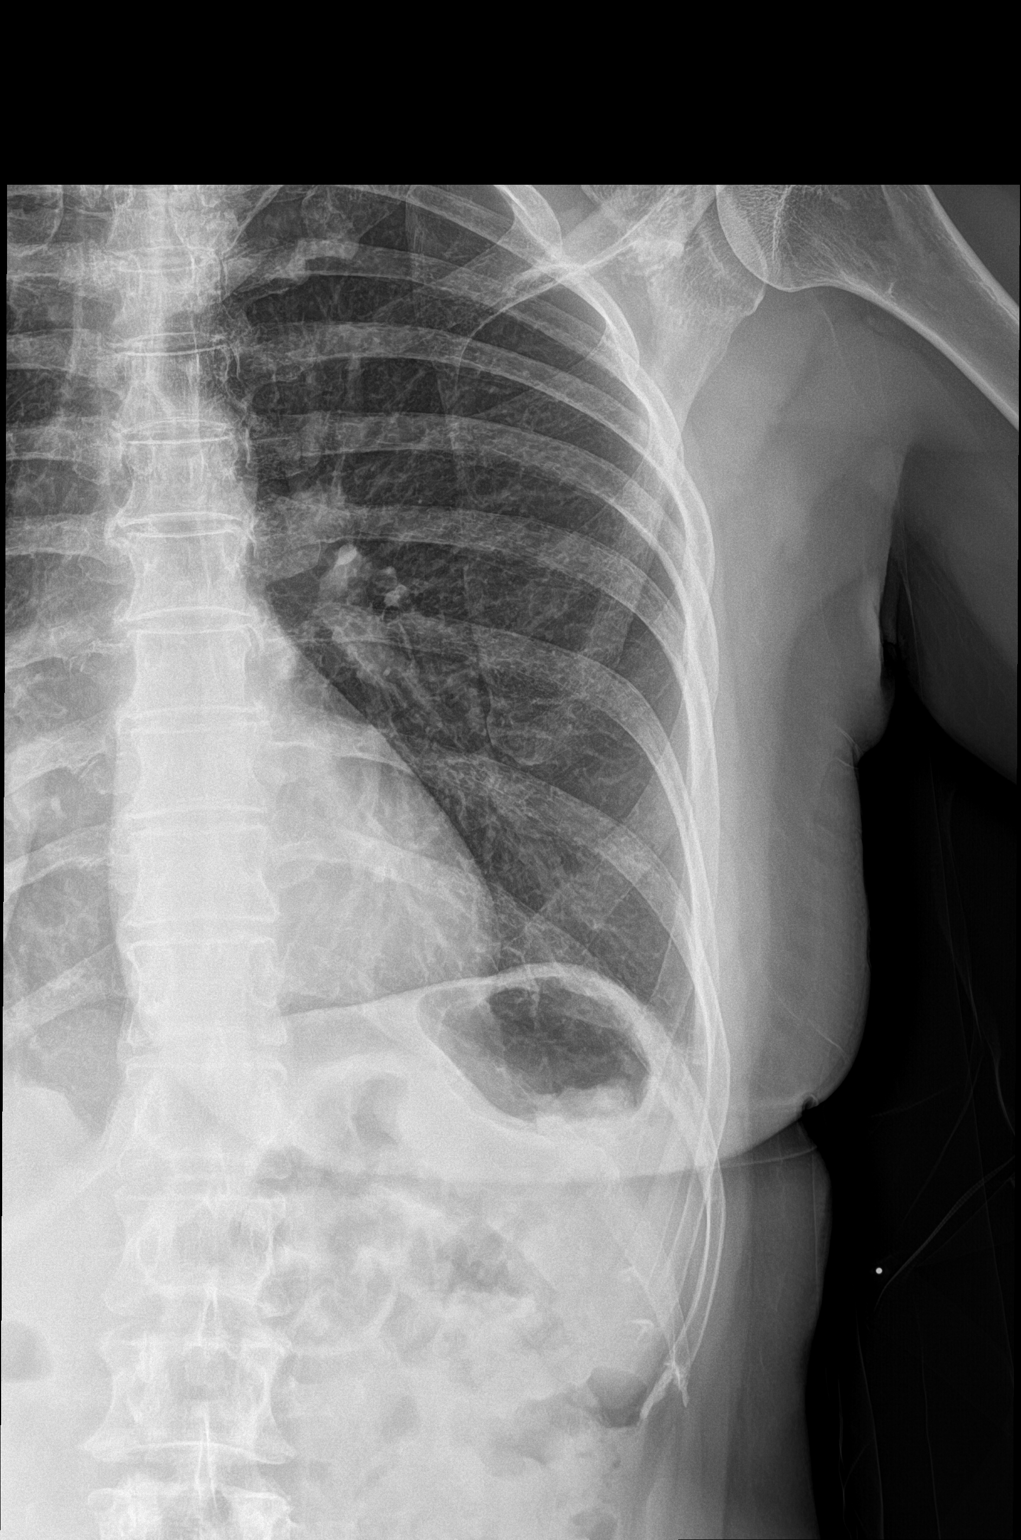

[rib obl]
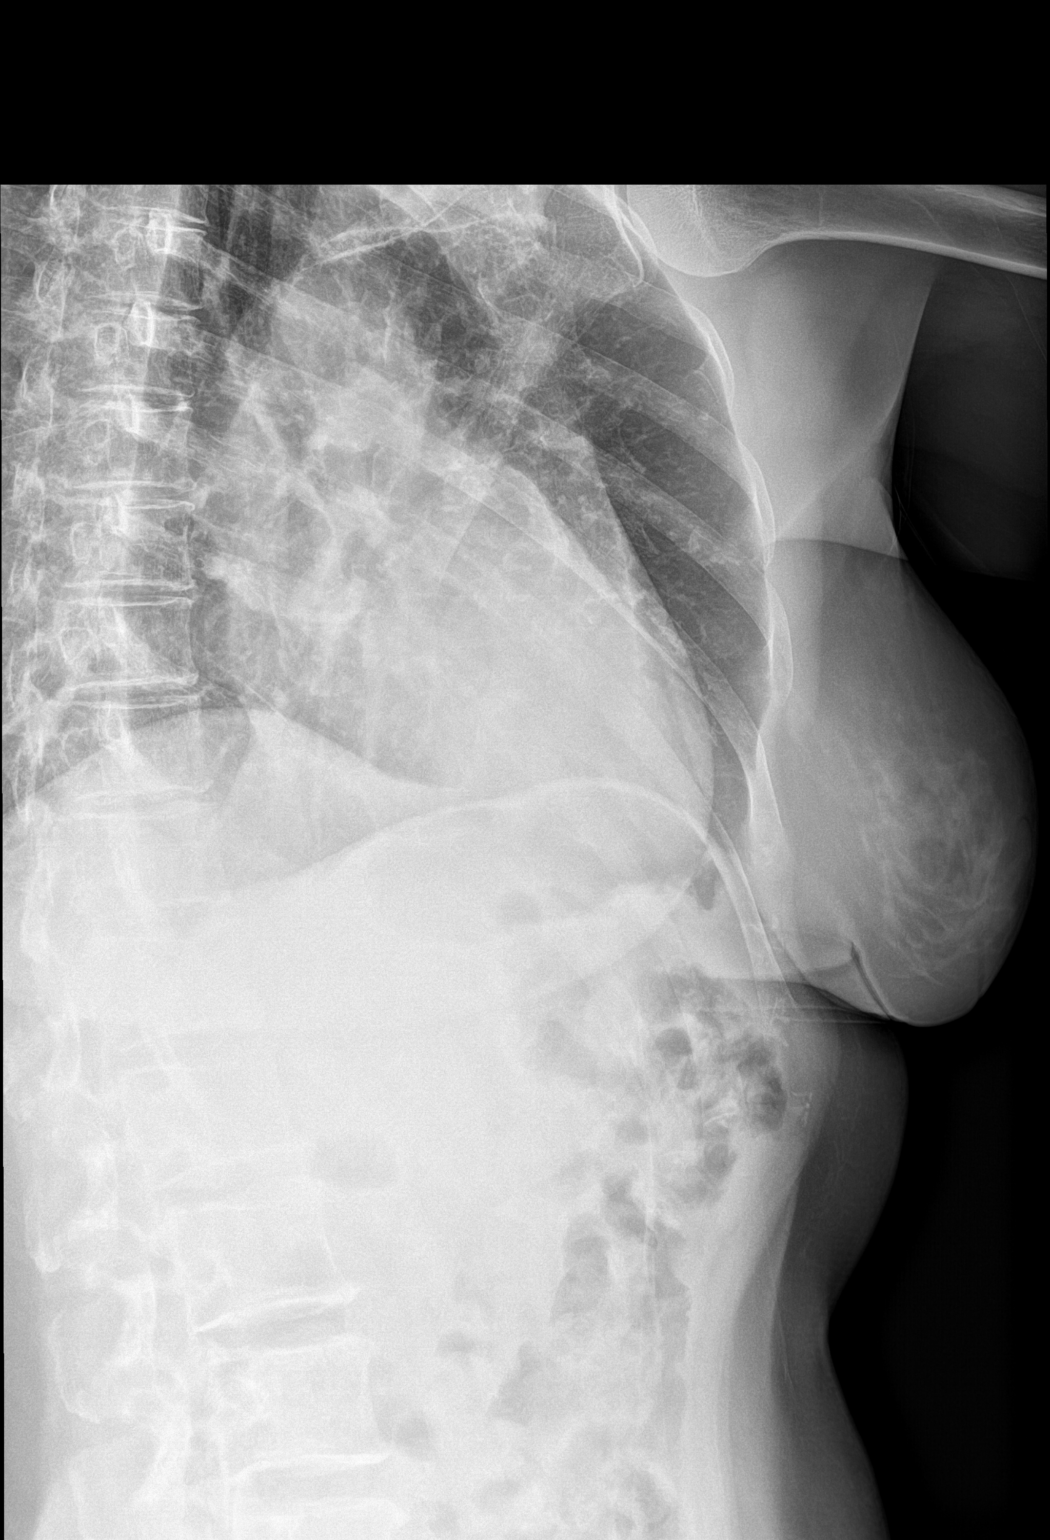

[chest pa]
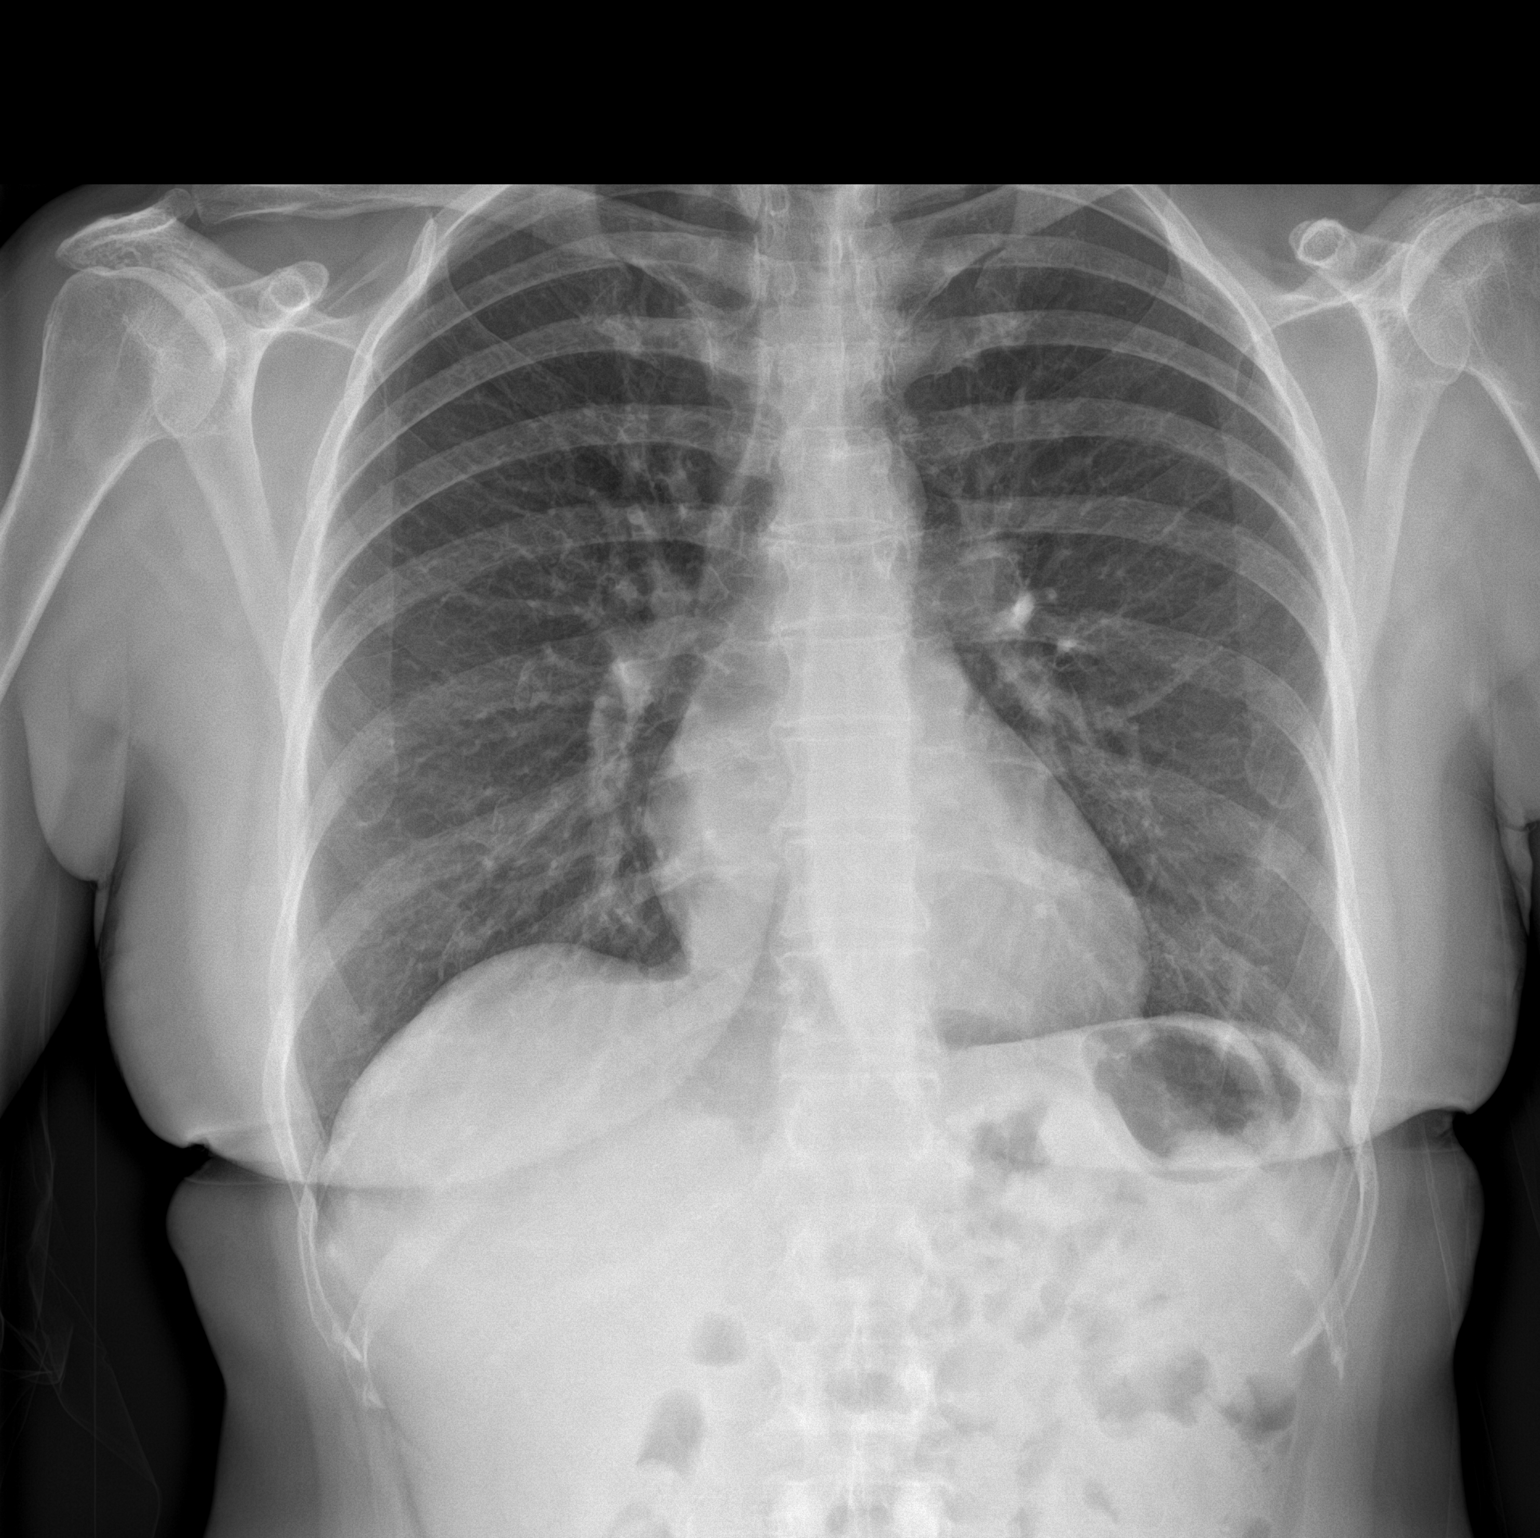

[3 of 3 positions shown; findings below may reference images not displayed]

FINDINGS: No fracture or other bone lesions are seen involving the ribs. There
is no evidence of pneumothorax or pleural effusion. Both lungs are
clear. Heart size and mediastinal contours are within normal limits.
IMPRESSION: Negative.

## 2023-08-26 ENCOUNTER — Encounter: Payer: Self-pay | Admitting: Nurse Practitioner

## 2023-08-26 ENCOUNTER — Ambulatory Visit: Payer: BC Managed Care – PPO | Admitting: Nurse Practitioner

## 2023-08-26 VITALS — BP 118/76 | HR 59 | Temp 98.3°F | Ht 66.0 in | Wt 166.8 lb

## 2023-08-26 DIAGNOSIS — J020 Streptococcal pharyngitis: Secondary | ICD-10-CM | POA: Diagnosis not present

## 2023-08-26 DIAGNOSIS — H9193 Unspecified hearing loss, bilateral: Secondary | ICD-10-CM

## 2023-08-26 MED ORDER — AZITHROMYCIN 250 MG PO TABS
250.0000 mg | ORAL_TABLET | Freq: Every day | ORAL | 0 refills | Status: DC
Start: 2023-08-26 — End: 2023-09-03

## 2023-08-26 NOTE — Patient Instructions (Signed)
Hearing Loss Hearing loss is a partial or total loss of the ability to hear. This can be temporary or permanent, and it can happen in one or both ears. There are two types of hearing loss. You can have just one type or both types. You may have a problem with: Damage to your hearing nerves (sensorineural hearing loss). This type of hearing loss is more likely to be permanent. A hearing aid is often the best treatment. Sound getting to your inner ear (conductive hearing loss). This type of hearing loss can usually be treated medically or surgically. Medical care is necessary to treat hearing loss properly and to prevent the condition from getting worse. Your hearing may partially or completely come back, depending on what caused your hearing loss and how severe it is. In some cases, hearing loss is permanent. What are the causes? Common causes of hearing loss include: Too much wax in the ear canal. Infection of the ear canal or middle ear. Fluid in the middle ear. Injury to the ear or surrounding area. An object stuck in the ear. A history of prolonged exposure to loud sounds, such as music. Less common causes of hearing loss include: Tumors in the ear. Viral or bacterial infections, such as meningitis. A hole in the eardrum (perforated eardrum). Problems with the hearing nerve that sends signals between the brain and the ear. Certain medicines. What are the signs or symptoms? Symptoms of this condition may include: Difficulty telling the difference between sounds. Difficulty following a conversation when there is background noise. Lack of response to sounds in your environment. This may be most noticeable when you do not respond to startling sounds. Needing to turn up the volume on the television, radio, or other devices. Ringing in the ears. Dizziness. How is this diagnosed? This condition is diagnosed based on: A physical exam. A hearing test (audiometry). The test will be performed  by a hearing specialist (audiologist). Imaging tests, such as an MRI or CT scan. You may also be referred to an ear, nose, and throat (ENT) specialist (otolaryngologist). How is this treated? Treatment for hearing loss may include: Earwax removal. Medicines to treat or prevent infection (antibiotics). Medicines to reduce inflammation (corticosteroids). Hearing aids or assistive listening devices. Follow these instructions at home: If you were prescribed an antibiotic medicine, take it as told by your health care provider. Do not stop taking the antibiotic even if you start to feel better. Take over-the-counter and prescription medicines only as told by your health care provider. Avoid loud noises. Use hearing protection if you are exposed to loud noises or work in a noisy environment. Return to your normal activities as told by your health care provider. Ask your health care provider what activities are safe for you. Keep all follow-up visits. This is important. Contact a health care provider if: You feel dizzy. You develop new symptoms. You vomit or feel nauseous. You have a fever. Get help right away if: You develop sudden changes in your vision. You have severe ear pain. You have new or increased weakness. You have a severe headache. Summary Hearing loss is a decreased ability to hear sounds around you. It can be temporary or permanent. Treatment will depend on the cause of your hearing loss. It may include earwax removal, medicines, or a hearing aid. Your hearing may partially or completely come back, depending on what caused your hearing loss and how severe it is. Keep all follow-up visits. This is important. This information is  not intended to replace advice given to you by your health care provider. Make sure you discuss any questions you have with your health care provider. Document Revised: 05/17/2021 Document Reviewed: 05/17/2021 Elsevier Patient Education  2024 [employer].

## 2023-08-26 NOTE — Progress Notes (Signed)
 Acute Office Visit  Subjective:    Patient ID: Wanda Cortez, female    DOB: May 09, 1971, 53 y.o.   MRN: [id number]  Chief Complaint  Patient presents with   Hearing Problem    For 2 months, concerns with some pain in glands of neck   HPI Patient is in today for decreased hearing x 2months. First noted by her husband. Also reports need to increased volume on electronics No tinnitus. No head/ear injury, no ear pain, no sinus congestion   Also reports persistent malaise, swollen lymph node and sore throat despite completion of penicillin  V x 10days. She tested positive for strep 10days ago.  Outpatient Medications Prior to Visit  Medication Sig   albuterol  (VENTOLIN  HFA) 108 (90 Base) MCG/ACT inhaler INHALE ONE TO TWO PUFFS BY MOUTH EVERY 6 HOURS AS NEEDED FOR SHORTNESS OF BREATH OR FOR WHEEZING   Ascorbic Acid (VITAMIN C) 100 MG CHEW Chew 1 tablet by mouth daily.   B Complex Vitamins (VITAMIN B COMPLEX) TABS Take 1 tablet by mouth daily.   BIOTIN PO Take 1,250 mcg by mouth daily.   BLACK COHOSH-DONG QUAI PO Take 1 tablet by mouth 2 times daily at 12 noon and 4 pm.   Spacer/Aero-Holding Chambers DEVI 1 Units by Does not apply route daily at 12 noon.   [DISCONTINUED] acyclovir  (ZOVIRAX ) 400 MG tablet Take 1 tablet (400 mg total) by mouth 4 (four) times daily. (Patient not taking: Reported on 08/26/2023)   [DISCONTINUED] EPINEPHrine  0.3 mg/0.3 mL IJ SOAJ injection epinephrine  0.3 mg/0.3 mL injection, auto-injector (Patient not taking: Reported on 08/26/2023)   [DISCONTINUED] gabapentin  (NEURONTIN ) 100 MG capsule Take 1 capsule (100 mg total) by mouth 3 (three) times daily. (Patient not taking: Reported on 06/06/2023)   [DISCONTINUED] MULTIPLE VITAMINS-MINERALS ER PO Take 1 tablet by mouth daily. (Patient not taking: Reported on 08/26/2023)   No facility-administered medications prior to visit.    Reviewed past medical and social history.  Review of Systems Per HPI     Objective:     Physical Exam Vitals and nursing note reviewed.  HENT:     Right Ear: Tympanic membrane, ear canal and external ear normal.     Left Ear: Tympanic membrane, ear canal and external ear normal.     Ears:     Weber exam findings: Does not lateralize.    Right Rinne: AC > BC.    Left Rinne: AC > BC.    Mouth/Throat:     Pharynx: Posterior oropharyngeal erythema present. No oropharyngeal exudate, uvula swelling or postnasal drip.     Tonsils: No tonsillar exudate or tonsillar abscesses.  Neck:     Thyroid : No thyroid  mass, thyromegaly or thyroid  tenderness.  Musculoskeletal:     Cervical back: Normal range of motion and neck supple.  Lymphadenopathy:     Cervical: Cervical adenopathy present.     Right cervical: Superficial cervical adenopathy present.     Left cervical: Superficial cervical adenopathy present.  Neurological:     Mental Status: She is alert.    BP 118/76 (BP Location: Right Arm, Patient Position: Sitting, Cuff Size: Normal)   Pulse (!) 59   Temp 98.3 F (36.8 C)   Ht 5' 6 (1.676 m)   Wt 166 lb 12.8 oz (75.7 kg)   LMP 09/01/2020   SpO2 98%   BMI 26.92 kg/m    No results found for any visits on 08/26/23.     Assessment & Plan:   Problem List  Items Addressed This Visit   None Visit Diagnoses       Decreased hearing of both ears    -  Primary   Relevant Orders   Ambulatory referral to Audiology     Strep throat       Relevant Medications   azithromycin  (ZITHROMAX  Z-PAK) 250 MG tablet      Meds ordered this encounter  Medications   azithromycin  (ZITHROMAX  Z-PAK) 250 MG tablet    Sig: Take 1 tablet (250 mg total) by mouth daily. Take 2tabs on first day, then 1tab once a day till complete    Dispense:  6 tablet    Refill:  0    Supervising Provider:   BERNETA ELSIE SAYRE [[id number]]   Return if symptoms worsen or fail to improve.  Roselie Mood, NP

## 2023-09-02 ENCOUNTER — Ambulatory Visit: Payer: Self-pay | Admitting: Nurse Practitioner

## 2023-09-02 NOTE — Telephone Encounter (Signed)
I called and spoken with patient and she said that she has used her Albuterol Inhaler and questions if she can use her albuterol nebulizer as needed. She said that she got it to use as needed from her allergist. Wanda Cortez said she sometimes has difficulty breathing. I told patient that if she feels like she can't breathe good to seek care at her near by urgent care facility.  Patient is scheduled for an appointment already for tomorrow at 1120am

## 2023-09-02 NOTE — Telephone Encounter (Signed)
I called and spoke with patient and notified her of Charlotte's recommendations.

## 2023-09-02 NOTE — Telephone Encounter (Signed)
Copied from CRM [id number]. Topic: Clinical - Red Word Triage >> Sep 02, 2023  1:18 PM Isabell A wrote: [facility] that prompted transfer to Nurse Triage: Patient needs assistance with using her nebulizer & inhaler, states she has asthma & is having a bad cough.   Patient stated she was recently seen and diagnosed with strep throat and treatment was prescribed on 2/4. She stated that her breathing and cough got worse over the weekend. She has an appt scheduled for tomorrow. She stated she currently has mild shortness of breath. She is calling today to ask if she can use her nebulizer right now if she has not done her inhaler.

## 2023-09-03 ENCOUNTER — Encounter: Payer: Self-pay | Admitting: Nurse Practitioner

## 2023-09-03 ENCOUNTER — Ambulatory Visit: Payer: BC Managed Care – PPO | Admitting: Nurse Practitioner

## 2023-09-03 VITALS — BP 124/76 | HR 70 | Temp 98.0°F | Wt 165.4 lb

## 2023-09-03 DIAGNOSIS — J069 Acute upper respiratory infection, unspecified: Secondary | ICD-10-CM

## 2023-09-03 DIAGNOSIS — J040 Acute laryngitis: Secondary | ICD-10-CM

## 2023-09-03 DIAGNOSIS — J4521 Mild intermittent asthma with (acute) exacerbation: Secondary | ICD-10-CM | POA: Diagnosis not present

## 2023-09-03 MED ORDER — AIRSUPRA 90-80 MCG/ACT IN AERO
2.0000 | INHALATION_SPRAY | Freq: Three times a day (TID) | RESPIRATORY_TRACT | 0 refills | Status: DC | PRN
Start: 2023-09-03 — End: 2024-02-02

## 2023-09-03 MED ORDER — ALBUTEROL SULFATE (2.5 MG/3ML) 0.083% IN NEBU
2.5000 mg | INHALATION_SOLUTION | Freq: Once | RESPIRATORY_TRACT | Status: AC
Start: 2023-09-03 — End: 2023-09-03
  Administered 2023-09-03: 2.5 mg via RESPIRATORY_TRACT

## 2023-09-03 NOTE — Progress Notes (Signed)
Established Patient Visit  Patient: Wanda Cortez   DOB: 06/27/1971   53 y.o. Female  MRN: [id number] Visit Date: 09/03/2023  Subjective:    Chief Complaint  Patient presents with   Medical Management of Chronic Issues    Throat pain, cough, sob, chest pain x 2 days.    URI  This is a new problem. The current episode started in the past 7 days. The problem has been unchanged. There has been no fever. Associated symptoms include congestion, coughing, headaches, joint pain, a plugged ear sensation, rhinorrhea, sinus pain, sneezing, a sore throat and wheezing. Pertinent negatives include no chest pain. She has tried antihistamine, acetaminophen and inhaler use for the symptoms. The treatment provided mild relief.  Unable to tolerate oral prednisone, but able to tolerate inhaler corticosteroid.  Reviewed medical, surgical, and social history today  Medications: Outpatient Medications Prior to Visit  Medication Sig   albuterol (VENTOLIN HFA) 108 (90 Base) MCG/ACT inhaler INHALE ONE TO TWO PUFFS BY MOUTH EVERY 6 HOURS AS NEEDED FOR SHORTNESS OF BREATH OR FOR WHEEZING   Ascorbic Acid (VITAMIN C) 100 MG CHEW Chew 1 tablet by mouth daily.   B Complex Vitamins (VITAMIN B COMPLEX) TABS Take 1 tablet by mouth daily.   BIOTIN PO Take 1,250 mcg by mouth daily.   BLACK COHOSH-DONG QUAI PO Take 1 tablet by mouth 2 times daily at 12 noon and 4 pm.   Spacer/Aero-Holding Chambers DEVI 1 Units by Does not apply route daily at 12 noon.   [DISCONTINUED] azithromycin (ZITHROMAX Z-PAK) 250 MG tablet Take 1 tablet (250 mg total) by mouth daily. Take 2tabs on first day, then 1tab once a day till complete   No facility-administered medications prior to visit.   Reviewed past medical and social history.   ROS per HPI above      Objective:  BP 124/76   Pulse 70   Temp 98 F (36.7 C) (Temporal)   Wt 165 lb 6.4 oz (75 kg)   LMP 09/01/2020   SpO2 99%   BMI 26.70 kg/m      Physical  Exam Vitals and nursing note reviewed.  HENT:     Head:     Jaw: There is normal jaw occlusion.     Right Ear: Tympanic membrane, ear canal and external ear normal.     Left Ear: Tympanic membrane, ear canal and external ear normal.     Nose: Congestion present.     Right Turbinates: Swollen.     Left Turbinates: Swollen.     Mouth/Throat:     Pharynx: Uvula midline. Posterior oropharyngeal erythema and postnasal drip present. No oropharyngeal exudate.     Tonsils: No tonsillar exudate.  Cardiovascular:     Rate and Rhythm: Normal rate and regular rhythm.     Pulses: Normal pulses.     Heart sounds: Normal heart sounds.  Pulmonary:     Effort: Pulmonary effort is normal. No respiratory distress.     Breath sounds: Wheezing present. No rhonchi or rales.     Comments: Equal and clear lung sounds post nebulizer treatment Neurological:     Mental Status: She is oriented to person, place, and time.     No results found for any visits on 09/03/23.    Assessment & Plan:    Problem List Items Addressed This Visit     Asthma   Relevant Medications  Albuterol-Budesonide (AIRSUPRA) 90-80 MCG/ACT AERO   Other Visit Diagnoses       Viral upper respiratory tract infection    -  Primary     Laryngitis         Maintain use of Airsupra 2puffs every 8hrs as needed for chest tightness and wheezing Rinse mouth after each use Hold albuterol when using Airsupra. Start antihistamine 1tab daily Continue use of promethazine Dm as needed for cough Maintain adequate oral hydration  Return if symptoms worsen or fail to improve.     Alysia Penna, NP

## 2023-09-03 NOTE — Patient Instructions (Addendum)
Maintain use of Airsupra 2puffs every 8hrs as needed for chest tightness and wheezing Rinse mouth after each use Hold albuterol when using Airsupra. Start antihistamine 1tab daily Continue use of promethazine Dm as needed for cough Maintain adequate oral hydration

## 2023-10-02 DIAGNOSIS — H93293 Other abnormal auditory perceptions, bilateral: Secondary | ICD-10-CM | POA: Diagnosis not present

## 2023-11-11 DIAGNOSIS — N898 Other specified noninflammatory disorders of vagina: Secondary | ICD-10-CM | POA: Diagnosis not present

## 2023-11-13 ENCOUNTER — Encounter: Payer: Self-pay | Admitting: Nurse Practitioner

## 2023-11-13 ENCOUNTER — Ambulatory Visit: Admitting: Nurse Practitioner

## 2023-11-13 ENCOUNTER — Ambulatory Visit: Payer: Self-pay | Admitting: *Deleted

## 2023-11-13 VITALS — BP 116/78 | HR 84 | Temp 97.0°F | Ht 66.0 in | Wt 164.6 lb

## 2023-11-13 DIAGNOSIS — R109 Unspecified abdominal pain: Secondary | ICD-10-CM | POA: Insufficient documentation

## 2023-11-13 LAB — POCT URINALYSIS DIPSTICK
Bilirubin, UA: NEGATIVE
Blood, UA: NEGATIVE
Glucose, UA: NEGATIVE
Ketones, UA: POSITIVE
Leukocytes, UA: NEGATIVE
Nitrite, UA: NEGATIVE
Protein, UA: NEGATIVE
Spec Grav, UA: 1.02 (ref 1.010–1.025)
Urobilinogen, UA: 0.2 U/dL
pH, UA: 5.5 (ref 5.0–8.0)

## 2023-11-13 NOTE — Telephone Encounter (Signed)
 Noted.

## 2023-11-13 NOTE — Patient Instructions (Signed)
 It was great to see you!  Continue tylenol  as needed for pain  You can alternate ice/heat  You can also try a lidocaine  patch  Watch for a rash if it develops   Let's follow-up if your symptoms worsen or don't improve   Take care,  Rheba Cedar, NP

## 2023-11-13 NOTE — Telephone Encounter (Signed)
 Copied from CRM [id number]. Topic: Clinical - Red Word Triage >> Nov 13, 2023 11:08 AM Kita Perish H wrote: [facility] that prompted transfer to Nurse Triage: Might have a kidney infection having pain in lower back on left and radiates around the side. Urine looks a little cloudy. Dizziness Reason for Disposition  Side (flank) or lower back pain present  Answer Assessment - Initial Assessment Questions 1. SYMPTOM: "What's the main symptom you're concerned about?" (e.g., frequency, incontinence)     I'm having flank pain that radiates around to the front on the left side.   Urine is cloudy.   No burning. 2. ONSET: "When did the  pain  start?"     Started 3 days ago.     I'm having dizziness this morning.   When I get an infection I get dizzy. 3. PAIN: "Is there any pain?" If Yes, ask: "How bad is it?" (Scale: 1-10; mild, moderate, severe)     No burning 4. CAUSE: "What do you think is causing the symptoms?"     UTI 5. OTHER SYMPTOMS: "Do you have any other symptoms?" (e.g., blood in urine, fever, flank pain, pain with urination)     Left flank, cloudy 6. PREGNANCY: "Is there any chance you are pregnant?" "When was your last menstrual period?"     N/A doe to age  Protocols used: Urinary Symptoms-A-AH  Chief Complaint: UTI Symptoms: Left flank pain, cloudy urine and dizziness. Frequency: Started 3 days ago Pertinent Negatives: Patient denies blood in urine or burning Disposition: [] ED /[] Urgent Care (no appt availability in office) / [x] Appointment(In office/virtual)/ []  [facility]/ [] Home Care/ [] Refused Recommended Disposition /[] [facility]/ []  Follow-up with PCP Additional Notes: Appt made for today at 4:00 with Rheba Cedar

## 2023-11-13 NOTE — Assessment & Plan Note (Signed)
 Flank pain x3 days. U/A negative for infection and blood. Will add on urine culture. She can continue tylenol  and alternate heat and ice. Encouraged her to monitor for rash, in case she has shingles. Follow-up if symptoms worsen or don't improve.

## 2023-11-13 NOTE — Progress Notes (Signed)
 Acute Office Visit  Subjective:     Patient ID: Wanda Cortez, female    DOB: October 14, 1970, 53 y.o.   MRN: [id number]  Chief Complaint  Patient presents with   Flank Pain    Left side flank pain for 3 days,  cloudy urine today    HPI  Wanda Cortez has been experiencing left flank pain for the past 3 days that radiates around her side. She also notes cloudy urine that started today and urinary frequency. She denies dysuria, fever, injury, and rash. She has had UTIs in the past, but these symptoms are not typical for her. She has been taking acetaminophen . She describes the pain as sharp.   ROS See pertinent positives and negatives per HPI.     Objective:    BP 116/78 (BP Location: Left Arm, Patient Position: Sitting, Cuff Size: Normal)   Pulse 84   Temp (!) 97 F (36.1 C)   Ht 5\' 6"  (1.676 m)   Wt 164 lb 9.6 oz (74.7 kg)   LMP 09/01/2020   SpO2 94%   BMI 26.57 kg/m    Physical Exam Vitals and nursing note reviewed.  Constitutional:      General: She is not in acute distress.    Appearance: Normal appearance.  HENT:     Head: Normocephalic.  Eyes:     Conjunctiva/sclera: Conjunctivae normal.  Cardiovascular:     Rate and Rhythm: Normal rate and regular rhythm.     Pulses: Normal pulses.     Heart sounds: Normal heart sounds.  Pulmonary:     Effort: Pulmonary effort is normal.     Breath sounds: Normal breath sounds.  Abdominal:     Palpations: Abdomen is soft.     Tenderness: There is no abdominal tenderness. There is no right CVA tenderness, left CVA tenderness or guarding.  Musculoskeletal:        General: Tenderness (left thoracic paraspinal muscle) present. No swelling.     Cervical back: Normal range of motion.  Skin:    General: Skin is warm.     Findings: No rash.  Neurological:     General: No focal deficit present.     Mental Status: She is alert and oriented to person, place, and time.  Psychiatric:        Mood and Affect: Mood normal.         Behavior: Behavior normal.        Thought Content: Thought content normal.        Judgment: Judgment normal.     Results for orders placed or performed in visit on 11/13/23  POCT urinalysis dipstick  Result Value Ref Range   Color, UA     Clarity, UA     Glucose, UA Negative Negative   Bilirubin, UA Negative    Ketones, UA Positive    Spec Grav, UA 1.020 1.010 - 1.025   Blood, UA Negative    pH, UA 5.5 5.0 - 8.0   Protein, UA Negative Negative   Urobilinogen, UA 0.2 0.2 or 1.0 E.U./dL   Nitrite, UA Negative    Leukocytes, UA Negative Negative   Appearance     Odor          Assessment & Plan:   Problem List Items Addressed This Visit       Other   Flank pain - Primary   Flank pain x3 days. U/A negative for infection and blood. Will add on urine culture. She can  continue tylenol  and alternate heat and ice. Encouraged her to monitor for rash, in case she has shingles. Follow-up if symptoms worsen or don't improve.       Relevant Orders   POCT urinalysis dipstick (Completed)   Urine Culture    No orders of the defined types were placed in this encounter.   Return if symptoms worsen or fail to improve.  Odette Benjamin, NP

## 2023-11-14 LAB — URINE CULTURE
MICRO NUMBER:: 16370939
Result:: NO GROWTH
SPECIMEN QUALITY:: ADEQUATE

## 2023-11-17 ENCOUNTER — Encounter: Payer: Self-pay | Admitting: Nurse Practitioner

## 2023-11-26 ENCOUNTER — Ambulatory Visit: Payer: Self-pay

## 2023-11-26 ENCOUNTER — Ambulatory Visit: Admitting: Nurse Practitioner

## 2023-11-26 ENCOUNTER — Encounter: Payer: Self-pay | Admitting: Nurse Practitioner

## 2023-11-26 VITALS — BP 122/70 | HR 106 | Temp 98.1°F | Ht 66.0 in | Wt 163.2 lb

## 2023-11-26 DIAGNOSIS — J029 Acute pharyngitis, unspecified: Secondary | ICD-10-CM

## 2023-11-26 LAB — POC COVID19 BINAXNOW: SARS Coronavirus 2 Ag: NEGATIVE

## 2023-11-26 LAB — POCT MONO (EPSTEIN BARR VIRUS): Mono, POC: NEGATIVE

## 2023-11-26 LAB — POCT RAPID STREP A (OFFICE): Rapid Strep A Screen: NEGATIVE

## 2023-11-26 NOTE — Telephone Encounter (Signed)
Noted. Pt is scheduled.

## 2023-11-26 NOTE — Telephone Encounter (Signed)
 Chief Complaint: sore throat Symptoms: sore throat Frequency: x 3 days Pertinent Negatives: Patient denies fever Disposition: [] ED /[] Urgent Care (no appt availability in office) / [x] Appointment(In office/virtual)/ []  [facility]/ [] Home Care/ [] Refused Recommended Disposition /[] [place] [facility]/ []  Follow-up with PCP Additional Notes: pt states sore throat, swollen, red with white dots last night. States now throat has white spots with red dots. States started 3 days ago. States took tylenol  with no relief.    Copied from CRM [id number]. Topic: Clinical - Red Word Triage >> Nov 26, 2023 10:04 AM Shereese L wrote: [facility] that prompted transfer to Nurse Triage: strep throat with white dot in the back/side of throat Reason for Disposition  [1] Pus on tonsils (back of throat) AND [2]  fever AND [3] swollen neck lymph nodes ("glands")  Answer Assessment - Initial Assessment Questions 1. ONSET: "When did the throat start hurting?" (Hours or days ago)      3 days 2. SEVERITY: "How bad is the sore throat?" (Scale 1-10; mild, moderate or severe)   - MILD (1-3):  Doesn't interfere with eating or normal activities.   - MODERATE (4-7): Interferes with eating some solids and normal activities.   - SEVERE (8-10):  Excruciating pain, interferes with most normal activities.   - SEVERE WITH DYSPHAGIA (10): Can't swallow liquids, drooling.     7/10 3. STREP EXPOSURE: "Has there been any exposure to strep within the past week?" If Yes, ask: "What type of contact occurred?"      unknown 4.  VIRAL SYMPTOMS: "Are there any symptoms of a cold, such as a runny nose, cough, hoarse voice or red eyes?"      Hoarse voice, nasal drainage 5. FEVER: "Do you have a fever?" If Yes, ask: "What is your temperature, how was it measured, and when did it start?"     no 6. PUS ON THE TONSILS: "Is there pus on the tonsils in the back of your throat?"     yes 7. OTHER SYMPTOMS: "Do you have any other  symptoms?" (e.g., difficulty breathing, headache, rash)     no  Protocols used: Sore Throat-A-AH

## 2023-11-26 NOTE — Progress Notes (Signed)
 Acute Office Visit  Subjective:    Patient ID: Wanda Cortez, female    DOB: [birth year], 53 y.o.   MRN: [id number]  Chief Complaint  Patient presents with   Sore Throat    Sore Throat  This is a new problem. The current episode started in the past 7 days. The problem has been unchanged. There has been no fever. Associated symptoms include a hoarse voice and swollen glands. Pertinent negatives include no abdominal pain, congestion, coughing, diarrhea, drooling, ear discharge, ear pain, headaches, plugged ear sensation, neck pain, shortness of breath, stridor, trouble swallowing or vomiting. She has had no exposure to strep or mono. She has tried acetaminophen  for the symptoms. The treatment provided mild relief.  Allergy to oral prednisone  and NSAIDs  Outpatient Medications Prior to Visit  Medication Sig   albuterol  (VENTOLIN  HFA) 108 (90 Base) MCG/ACT inhaler INHALE ONE TO TWO PUFFS BY MOUTH EVERY 6 HOURS AS NEEDED FOR SHORTNESS OF BREATH OR FOR WHEEZING   Albuterol -Budesonide  (AIRSUPRA ) 90-80 MCG/ACT AERO Inhale 2 Inhalations into the lungs every 8 (eight) hours as needed.   Ascorbic Acid (VITAMIN C) 100 MG CHEW Chew 1 tablet by mouth daily.   B Complex Vitamins (VITAMIN B COMPLEX) TABS Take 1 tablet by mouth daily.   BIOTIN PO Take 1,250 mcg by mouth daily.   Spacer/Aero-Holding Chambers DEVI 1 Units by Does not apply route daily at 12 noon.   [DISCONTINUED] BLACK COHOSH-DONG QUAI PO Take 1 tablet by mouth 2 times daily at 12 noon and 4 pm. (Patient not taking: Reported on 11/26/2023)   No facility-administered medications prior to visit.   Reviewed past medical and social history.  Review of Systems  HENT:  Positive for hoarse voice. Negative for congestion, drooling, ear discharge, ear pain and trouble swallowing.   Respiratory:  Negative for cough, shortness of breath and stridor.   Gastrointestinal:  Negative for abdominal pain, diarrhea and vomiting.  Musculoskeletal:  Negative  for neck pain.  Neurological:  Negative for headaches.   Per HPI     Objective:    Physical Exam Vitals reviewed.  HENT:     Right Ear: Tympanic membrane and ear canal normal.     Left Ear: Tympanic membrane and ear canal normal.     Nose: No congestion.     Mouth/Throat:     Pharynx: Uvula midline. Posterior oropharyngeal erythema present. No oropharyngeal exudate or uvula swelling.     Tonsils: No tonsillar exudate.  Eyes:     Conjunctiva/sclera: Conjunctivae normal.  Cardiovascular:     Rate and Rhythm: Normal rate.  Pulmonary:     Effort: Pulmonary effort is normal.  Musculoskeletal:     Cervical back: Normal range of motion.  Lymphadenopathy:     Cervical: Cervical adenopathy present.  Neurological:     Mental Status: She is alert.    BP 122/70 (BP Location: Left Arm, Patient Position: Sitting)   Pulse (!) 106   Temp 98.1 F (36.7 C) (Temporal)   Ht 5\' 6"  (1.676 m)   Wt 163 lb 3.2 oz (74 kg)   LMP 09/01/2020   SpO2 98%   BMI 26.34 kg/m    No results found for any visits on 11/26/23.     Assessment & Plan:   Problem List Items Addressed This Visit   None Visit Diagnoses       Sore throat    -  Primary   Relevant Orders   POCT rapid strep A  POCT Mono (Epstein Barr Virus)     Negative strep, COVID, and mono test Use over-the-counter  cold" medicine  such as Dayquil/Nyquil/Theraflu/Alkaseltzer for cough and sinus congestion. You can use plain "Tylenol " or "Advil" for fever, chills and achyness. Use cool mist humidifier at bedtime to help with nasal congestion and cough.  No orders of the defined types were placed in this encounter.  Return if symptoms worsen or fail to improve.    Kathrene Parents, NP

## 2023-11-26 NOTE — Patient Instructions (Addendum)
 URI Instructions: Negative strep, COVID, and mono test Use over-the-counter  cold" medicine  such as Dayquil/Nyquil/Theraflu/Alkaseltzer for cough and sinus congestion. You can use plain "Tylenol " or "Advil" for fever, chills and achyness. Use cool mist humidifier at bedtime to help with nasal congestion and cough.  Cold/cough medications may have tylenol  or ibuprofen or guaifenesin  or dextromethophan in them, so be careful not to take beyond the recommended dose for each of these medications.   "Common cold" symptoms are usually triggered by a virus.  The antibiotics are usually not necessary. On average, a" viral cold" illness may take 7-10 days to resolve. Please, make an appointment if you are not better or if you're worse.

## 2023-11-29 ENCOUNTER — Encounter: Payer: Self-pay | Admitting: Internal Medicine

## 2023-11-29 ENCOUNTER — Ambulatory Visit
Admission: EM | Admit: 2023-11-29 | Discharge: 2023-11-29 | Disposition: A | Discharge status: Home / Self Care | Attending: Internal Medicine | Admitting: Internal Medicine

## 2023-11-29 ENCOUNTER — Encounter: Payer: Self-pay | Admitting: Emergency Medicine

## 2023-11-29 ENCOUNTER — Ambulatory Visit (HOSPITAL_BASED_OUTPATIENT_CLINIC_OR_DEPARTMENT_OTHER)
Admission: RE | Admit: 2023-11-29 | Discharge: 2023-11-29 | Disposition: A | Discharge status: Home / Self Care | Source: Ambulatory Visit | Attending: Internal Medicine | Admitting: Internal Medicine

## 2023-11-29 DIAGNOSIS — R0602 Shortness of breath: Secondary | ICD-10-CM | POA: Insufficient documentation

## 2023-11-29 DIAGNOSIS — J4541 Moderate persistent asthma with (acute) exacerbation: Secondary | ICD-10-CM | POA: Diagnosis not present

## 2023-11-29 DIAGNOSIS — R059 Cough, unspecified: Secondary | ICD-10-CM | POA: Diagnosis not present

## 2023-11-29 MED ORDER — ALBUTEROL SULFATE (2.5 MG/3ML) 0.083% IN NEBU: 2.5000 mg | INHALATION_SOLUTION | Freq: Four times a day (QID) | RESPIRATORY_TRACT | 12 refills | Status: AC | PRN

## 2023-11-29 MED ORDER — IPRATROPIUM-ALBUTEROL 0.5-2.5 (3) MG/3ML IN SOLN
3.0000 mL | Freq: Once | RESPIRATORY_TRACT | Status: AC
  Administered 2023-11-29: 3 mL via RESPIRATORY_TRACT

## 2023-11-29 MED ORDER — BUDESONIDE-FORMOTEROL FUMARATE 80-4.5 MCG/ACT IN AERO: 2.0000 | INHALATION_SPRAY | Freq: Two times a day (BID) | RESPIRATORY_TRACT | 2 refills | Status: DC

## 2023-11-29 MED ORDER — PROMETHAZINE-DM 6.25-15 MG/5ML PO SYRP: 5.0000 mL | ORAL_SOLUTION | Freq: Every evening | ORAL | 0 refills | Status: DC | PRN

## 2023-11-29 NOTE — ED Triage Notes (Signed)
 Pt states ear ache began Sunday then sore throat started Monday.  She developed cough on Wednesday and went to doctors office. Tested for covid, flu, mono, and strep which were negative. Present today due to worsening cough

## 2023-11-29 NOTE — Discharge Instructions (Signed)
 Please go to med [facility] and have x-ray performed. Do not check in to the ER. Go to imaging department, get images performed, then go home. You will receive a phone call if the x-ray shows any abnormal results requiring further treatment. If the x-ray results do not change our treatment plan, you will not receive a phone call.  Also see these results on MyChart.  Med [facility] [address] [place], [place]  Your symptoms are due to asthma attack. - Use albuterol  every 4-6 hours as needed for cough, shortness of breath, and wheeze. - Use 2 puffs of spiriva inhaler (long acting relief and inhaled steroid) every 12 hours during asthma attack. - Take prescribed cough medicine as needed- promethazine  DM- will make you sleepy so take at bedtime.  If your symptoms do not improve in the next 2-3 days with interventions, please return. Please seek medical care for new or returning symptoms, such as difficulty breathing that doesn't improve with your medications, chest pain, voice changes, high fevers, confusion, or other new or worsening symptoms. Follow-up with PCP for ongoing management of asthma.

## 2023-11-29 NOTE — ED Provider Notes (Signed)
 [facility]    CSN: [id number] Arrival date & time: 11/29/23  4098      History   Chief Complaint Chief Complaint  Patient presents with   Sore Throat   Nasal Congestion   Cough    HPI Wanda Cortez is a 53 y.o. female.   Wanda Cortez is a 53 y.o. female presenting for chief complaint of Sore Throat, Nasal Congestion, and Cough that started 1 week ago. Symptoms began with bilateral ear pain 6-7 days ago, then progressed to sore throat and cough starting 3-4 days ago. Cough is mostly dry and is worse at nighttime. Reports shortness of breath and bilateral chest discomfort associated with coughing. History of asthma, she has not attempted use of nebulizer at home. Denies recent sick contacts with similar symptoms. She saw her PCP 3-4 days ago where she tested negative for COVID, flu, mono, and strep. Using over-the-counter medications with minimal relief to treat symptoms.   Sore Throat  Cough   Past Medical History:  Diagnosis Date   Asthma    Bronchitis    Pleurisy    PONV (postoperative nausea and vomiting)    Recurrent upper respiratory infection (URI)    Urticaria     Patient Active Problem List   Diagnosis Date Noted   Flank pain 11/13/2023   Chronic chest wall pain 06/06/2023   Astigmatism of both eyes with presbyopia 06/03/2023   Dry eye syndrome of both eyes 06/03/2023   Family history of glaucoma 06/03/2023   Abdominal pain 05/03/2023   Right upper quadrant abdominal pain 05/02/2023   Anterior pleuritic pain 03/26/2023   Situational anxiety 02/27/2023   GERD (gastroesophageal reflux disease) 11/22/2022   Family history of breast cancer gene mutation in first degree relative 11/22/2022   Family history of uterine cancer 10/03/2022   Tendinopathy of right rotator cuff 06/20/2022   Asthma 06/07/2021   Food allergy 06/07/2021   Shoulder effusion, right 03/21/2021   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/21/2021   Cervical radiculopathy  03/16/2021   Menopausal flushing 07/06/2020   Lipoma of left lower extremity 01/04/2020   History of cervical cancer 07/12/2019   Tear of right scapholunate ligament 03/30/2019   Essential hypertension 02/25/2019   Chronic pain of right knee 09/22/2018   Acute lateral meniscus tear of left knee 06/10/2017   Submucous leiomyoma of uterus 03/21/2016   Uterine leiomyoma 03/21/2016   Allergy to insect stings 12/27/2014    Past Surgical History:  Procedure Laterality Date   APPENDECTOMY     CERVICAL BIOPSY  W/ LOOP ELECTRODE EXCISION     CHOLECYSTECTOMY N/A 05/04/2023   Procedure: LAPAROSCOPIC CHOLECYSTECTOMY;  Surgeon: Derral Flick, MD;  Location: [facility] OR;  Service: General;  Laterality: N/A;   ESOPHAGOGASTRODUODENOSCOPY (EGD) WITH PROPOFOL  N/A 05/03/2023   Procedure: ESOPHAGOGASTRODUODENOSCOPY (EGD) WITH PROPOFOL ;  Surgeon: Ozell Blunt, MD;  Location: [facility] ENDOSCOPY;  Service: Gastroenterology;  Laterality: N/A;   KNEE SURGERY Right 2018    OB History   No obstetric history on file.      Home Medications    Prior to Admission medications   Medication Sig Start Date End Date Taking? Authorizing Provider  albuterol  (PROVENTIL ) (2.5 MG/3ML) 0.083% nebulizer solution Take 3 mLs (2.5 mg total) by nebulization every 6 (six) hours as needed for wheezing or shortness of breath. 11/29/23  Yes Starlene Eaton, FNP  budesonide -formoterol  (SYMBICORT ) 80-4.5 MCG/ACT inhaler Inhale 2 puffs into the lungs every 12 (twelve) hours. 11/29/23  Yes  Starlene Eaton, FNP  promethazine -dextromethorphan (PROMETHAZINE -DM) 6.25-15 MG/5ML syrup Take 5 mLs by mouth at bedtime as needed for cough. 11/29/23  Yes Starlene Eaton, FNP  albuterol  (VENTOLIN  HFA) 108 (90 Base) MCG/ACT inhaler INHALE ONE TO TWO PUFFS BY MOUTH EVERY 6 HOURS AS NEEDED FOR SHORTNESS OF BREATH OR FOR WHEEZING 01/31/23   Nche, Connye Delaine, NP  Albuterol -Budesonide  (AIRSUPRA ) 90-80 MCG/ACT AERO Inhale 2 Inhalations  into the lungs every 8 (eight) hours as needed. 09/03/23   Nche, Connye Delaine, NP  Ascorbic Acid (VITAMIN C) 100 MG CHEW Chew 1 tablet by mouth daily.    [provider]  B Complex Vitamins (VITAMIN B COMPLEX) TABS Take 1 tablet by mouth daily.    [provider]  BIOTIN PO Take 1,250 mcg by mouth daily.    [provider]  Spacer/Aero-Holding Chambers DEVI 1 Units by Does not apply route daily at 12 noon. 11/22/22   Nche, Connye Delaine, NP    Family History Family History  Problem Relation Age of Onset   Asthma Mother    Allergic rhinitis Mother    Breast cancer Mother        28s   Allergic rhinitis Father    Stroke Father    Hypertension Father    ALS Father 11       negative genetic test   Allergic rhinitis Sister    Arthritis Sister    Allergic rhinitis Sister    Stroke Maternal Grandmother    Uterine cancer Maternal Grandmother    Osteoporosis Maternal Grandmother    Heart disease Paternal Grandmother    Stroke Paternal Grandfather    Heart disease Paternal Grandfather    Allergic rhinitis Nephew    Allergic rhinitis Niece     Social History Social History   Tobacco Use   Smoking status: Never   Smokeless tobacco: Never  Vaping Use   Vaping status: Never Used  Substance Use Topics   Alcohol use: No   Drug use: No     Allergies   Loratadine, Meloxicam , Other, Wasp venom, Yellow jacket venom, Lorazepam , Nsaids, and Prednisone    Review of Systems Review of Systems  Respiratory:  Positive for cough.   Per HPI   Physical Exam Triage Vital Signs ED Triage Vitals  Encounter Vitals Group     BP 11/29/23 1031 138/75     Systolic BP Percentile --      Diastolic BP Percentile --      Pulse Rate 11/29/23 1031 (!) 105     Resp 11/29/23 1031 17     Temp 11/29/23 1031 98.2 F (36.8 C)     Temp Source 11/29/23 1031 Oral     SpO2 11/29/23 1031 95 %     Weight --      Height --      Head Circumference --      Peak Flow --      Pain  Score 11/29/23 1037 6     Pain Loc --      Pain Education --      Exclude from Growth Chart --    No data found.  Updated Vital Signs BP 138/75 (BP Location: Right Arm)   Pulse (!) 105   Temp 98.2 F (36.8 C) (Oral)   Resp 17   LMP 09/01/2020   SpO2 95%   Visual Acuity Right Eye Distance:   Left Eye Distance:   Bilateral Distance:    Right Eye Near:   Left  Eye Near:    Bilateral Near:     Physical Exam Vitals and nursing note reviewed.  Constitutional:      Appearance: She is not ill-appearing or toxic-appearing.  HENT:     Head: Normocephalic and atraumatic.     Right Ear: Hearing, tympanic membrane, ear canal and external ear normal.     Left Ear: Hearing, tympanic membrane, ear canal and external ear normal.     Nose: Congestion present.     Mouth/Throat:     Lips: Pink.     Mouth: Mucous membranes are moist. No injury or oral lesions.     Dentition: Normal dentition.     Tongue: No lesions.     Pharynx: Oropharynx is clear. Uvula midline. No pharyngeal swelling, oropharyngeal exudate, posterior oropharyngeal erythema, uvula swelling or postnasal drip.     Tonsils: No tonsillar exudate.  Eyes:     General: Lids are normal. Vision grossly intact. Gaze aligned appropriately.     Extraocular Movements: Extraocular movements intact.     Conjunctiva/sclera: Conjunctivae normal.  Neck:     Trachea: Trachea and phonation normal.  Cardiovascular:     Rate and Rhythm: Normal rate and regular rhythm.     Heart sounds: Normal heart sounds, S1 normal and S2 normal.  Pulmonary:     Effort: Pulmonary effort is normal. No respiratory distress.     Breath sounds: Normal air entry. Wheezing present. No rhonchi or rales.     Comments: Harsh, dry, and barky frequent cough with deep inspiration on exam. Chest:     Chest wall: No tenderness.  Musculoskeletal:     Cervical back: Neck supple.     Right lower leg: No edema.     Left lower leg: No edema.  Lymphadenopathy:      Cervical: No cervical adenopathy.  Skin:    General: Skin is warm and dry.     Capillary Refill: Capillary refill takes less than 2 seconds.     Findings: No rash.  Neurological:     General: No focal deficit present.     Mental Status: She is alert and oriented to person, place, and time. Mental status is at baseline.     Cranial Nerves: No dysarthria or facial asymmetry.  Psychiatric:        Mood and Affect: Mood normal.        Speech: Speech normal.        Behavior: Behavior normal.        Thought Content: Thought content normal.        Judgment: Judgment normal.      UC Treatments / Results  Labs (all labs ordered are listed, but only abnormal results are displayed) Labs Reviewed - No data to display  EKG   Radiology DG Chest 2 View Result Date: 11/29/2023 CLINICAL DATA:  Cough, shortness of breath EXAM: CHEST - 2 VIEW COMPARISON:  Prior chest x-ray 05/28/2023 FINDINGS: The heart size and mediastinal contours are within normal limits. Both lungs are clear. The visualized skeletal structures are unremarkable. IMPRESSION: No active cardiopulmonary disease. Electronically Signed   By: Fernando Hoyer M.D.   On: 11/29/2023 13:06    Procedures Procedures (including critical care time)  Medications Ordered in UC Medications  ipratropium-albuterol  (DUONEB) 0.5-2.5 (3) MG/3ML nebulizer solution 3 mL (3 mLs Nebulization Given 11/29/23 1052)    Initial Impression / Assessment and Plan / UC Course  I have reviewed the triage vital signs and the nursing notes.  Pertinent labs &  imaging results that were available during my care of the patient were reviewed by me and considered in my medical decision making (see chart for details).   1.  Moderate persistent asthma with acute exacerbation Evaluation suggests asthma exacerbation likely triggered by viral URI.  Outpatient chest x-ray ordered to rule out underlying pneumonia process, staff will call if abnormal.   Interventions  in clinic: breathing treatment improved shortness of breath and lung sounds on reassessment prior to discharge.   She is allergic to prednisone , called ER pharmacist at [facility] to discuss the potential use of decadron  to treat asthma exacerbation. ER pharmacist states this is a good choice and patient would likely tolerate Decadron  without reaction, however patient would like to go home and consider this before trying IM injection of steroid due to concern for rash.  She has been able to tolerate inhaled steroids in the past. Spiriva ordered for her to take 2 puffs every 12 hours on a schedule throughout asthma exacerbation. She may continue using albuterol  nebulizer as needed for rescue. Promethazine  DM at bedtime as needed for cough, drowsiness precautions discussed.  Discussed PCP follow-up to discuss asthma action plan to prevent future asthma exacerbations.   Counseled patient on potential for adverse effects with medications prescribed/recommended today, strict ER and return-to-clinic precautions discussed, patient verbalized understanding.    Final Clinical Impressions(s) / UC Diagnoses   Final diagnoses:  Moderate persistent asthma with acute exacerbation     Discharge Instructions      Please go to med [facility] and have x-ray performed. Do not check in to the ER. Go to imaging department, get images performed, then go home. You will receive a phone call if the x-ray shows any abnormal results requiring further treatment. If the x-ray results do not change our treatment plan, you will not receive a phone call.  Also see these results on MyChart.  Med [facility] [address] [place], [place]  Your symptoms are due to asthma attack. - Use albuterol  every 4-6 hours as needed for cough, shortness of breath, and wheeze. - Use 2 puffs of spiriva inhaler (long acting relief and inhaled steroid) every 12 hours during asthma attack. - Take prescribed  cough medicine as needed- promethazine  DM- will make you sleepy so take at bedtime.  If your symptoms do not improve in the next 2-3 days with interventions, please return. Please seek medical care for new or returning symptoms, such as difficulty breathing that doesn't improve with your medications, chest pain, voice changes, high fevers, confusion, or other new or worsening symptoms. Follow-up with PCP for ongoing management of asthma.   ED Prescriptions     Medication Sig Dispense Auth. Provider   budesonide -formoterol  (SYMBICORT ) 80-4.5 MCG/ACT inhaler Inhale 2 puffs into the lungs every 12 (twelve) hours. 1 each Starlene Eaton, FNP   promethazine -dextromethorphan (PROMETHAZINE -DM) 6.25-15 MG/5ML syrup Take 5 mLs by mouth at bedtime as needed for cough. 118 mL Shella Devoid M, FNP   albuterol  (PROVENTIL ) (2.5 MG/3ML) 0.083% nebulizer solution Take 3 mLs (2.5 mg total) by nebulization every 6 (six) hours as needed for wheezing or shortness of breath. 75 mL Starlene Eaton, FNP      PDMP not reviewed this encounter.   Starlene Eaton, [place] 11/29/23 1314

## 2023-12-18 DIAGNOSIS — J069 Acute upper respiratory infection, unspecified: Secondary | ICD-10-CM | POA: Diagnosis not present

## 2023-12-18 DIAGNOSIS — J209 Acute bronchitis, unspecified: Secondary | ICD-10-CM | POA: Diagnosis not present

## 2024-01-22 ENCOUNTER — Encounter (INDEPENDENT_AMBULATORY_CARE_PROVIDER_SITE_OTHER): Payer: Self-pay | Admitting: Nurse Practitioner

## 2024-01-22 DIAGNOSIS — L821 Other seborrheic keratosis: Secondary | ICD-10-CM | POA: Diagnosis not present

## 2024-01-22 NOTE — Telephone Encounter (Signed)
 Please see the MyChart message reply(ies) for my assessment and plan.  The patient gave consent for this Medical Advice Message and is aware that it may result in a bill to their insurance company as well as the possibility that this may result in a co-payment or deductible. They are an established patient, but are not seeking medical advice exclusively about a problem treated during an in person or video visit in the last 7 days. I did not recommend an in person or video visit within 7 days of my reply.  I spent a total of 11 minutes cumulative time within 7 days through [employer] Roselie Mood, NP

## 2024-01-29 DIAGNOSIS — N952 Postmenopausal atrophic vaginitis: Secondary | ICD-10-CM | POA: Diagnosis not present

## 2024-01-29 DIAGNOSIS — N941 Unspecified dyspareunia: Secondary | ICD-10-CM | POA: Diagnosis not present

## 2024-02-01 ENCOUNTER — Encounter: Payer: Self-pay | Admitting: Nurse Practitioner

## 2024-02-02 ENCOUNTER — Encounter: Payer: Self-pay | Admitting: Nurse Practitioner

## 2024-02-02 ENCOUNTER — Ambulatory Visit: Admitting: Nurse Practitioner

## 2024-02-02 VITALS — BP 122/74 | HR 79 | Temp 98.3°F | Ht 66.0 in | Wt 168.8 lb

## 2024-02-02 DIAGNOSIS — N644 Mastodynia: Secondary | ICD-10-CM

## 2024-02-02 NOTE — Progress Notes (Signed)
   Acute Office Visit  Subjective:    Patient ID: Wanda Cortez, female    DOB: 01/27/1971, 53 y.o.   MRN: [id number]  Chief Complaint  Patient presents with   Breast Mass    Under tight breast painful when touch and/or pressed    HPI Patient is in today for right breast pain x 2days. Denies any chest wall injury, no lymphadenopathy. Last mammogram 68yrs ago.  Outpatient Medications Prior to Visit  Medication Sig   albuterol  (PROVENTIL ) (2.5 MG/3ML) 0.083% nebulizer solution Take 3 mLs (2.5 mg total) by nebulization every 6 (six) hours as needed for wheezing or shortness of breath.   albuterol  (VENTOLIN  HFA) 108 (90 Base) MCG/ACT inhaler INHALE ONE TO TWO PUFFS BY MOUTH EVERY 6 HOURS AS NEEDED FOR SHORTNESS OF BREATH OR FOR WHEEZING   Ascorbic Acid (VITAMIN C) 100 MG CHEW Chew 1 tablet by mouth daily.   B Complex Vitamins (VITAMIN B COMPLEX) TABS Take 1 tablet by mouth daily.   BIOTIN PO Take 1,250 mcg by mouth daily.   Spacer/Aero-Holding Chambers DEVI 1 Units by Does not apply route daily at 12 noon.   Albuterol -Budesonide  (AIRSUPRA ) 90-80 MCG/ACT AERO Inhale 2 Inhalations into the lungs every 8 (eight) hours as needed. (Patient not taking: Reported on 02/02/2024)   budesonide -formoterol  (SYMBICORT ) 80-4.5 MCG/ACT inhaler Inhale 2 puffs into the lungs every 12 (twelve) hours. (Patient not taking: Reported on 02/02/2024)   promethazine -dextromethorphan (PROMETHAZINE -DM) 6.25-15 MG/5ML syrup Take 5 mLs by mouth at bedtime as needed for cough. (Patient not taking: Reported on 02/02/2024)   No facility-administered medications prior to visit.    Reviewed past medical and social history.  Review of Systems Per HPI     Objective:    Physical Exam Vitals and nursing note reviewed.  Chest:     Chest wall: No mass or tenderness.  Breasts:    Breasts are symmetrical.     Right: Mass and tenderness present. No swelling, bleeding, inverted nipple, nipple discharge or skin change.     Left:  Normal.    Lymphadenopathy:     Upper Body:     Right upper body: No supraclavicular, axillary or pectoral adenopathy.     Left upper body: No supraclavicular, axillary or pectoral adenopathy.  Neurological:     Mental Status: She is alert.     BP 122/74 (BP Location: Left Arm, Patient Position: Sitting, Cuff Size: Normal)   Pulse 79   Temp 98.3 F (36.8 C) (Oral)   Ht 5' 6 (1.676 m)   Wt 168 lb 12.8 oz (76.6 kg)   LMP 09/01/2020   SpO2 98%   BMI 27.25 kg/m    No results found for any visits on 02/02/24.     Assessment & Plan:   Problem List Items Addressed This Visit   None Visit Diagnoses       Breast pain, right    -  Primary   Relevant Orders   MM 3D DIAGNOSTIC MAMMOGRAM BILATERAL BREAST   MM 3D DIAGNOSTIC MAMMOGRAM UNILATERAL RIGHT BREAST   US  BREAST COMPLETE UNI RIGHT INC AXILLA      No orders of the defined types were placed in this encounter.  Return if symptoms worsen or fail to improve.    Roselie Mood, NP

## 2024-02-03 ENCOUNTER — Ambulatory Visit
Admission: RE | Admit: 2024-02-03 | Discharge: 2024-02-03 | Disposition: A | Discharge status: Home / Self Care | Source: Ambulatory Visit | Attending: Nurse Practitioner | Admitting: Nurse Practitioner

## 2024-02-03 ENCOUNTER — Ambulatory Visit: Payer: Self-pay | Admitting: Nurse Practitioner

## 2024-02-03 ENCOUNTER — Inpatient Hospital Stay
Admission: RE | Admit: 2024-02-03 | Discharge: 2024-02-03 | Discharge status: Home / Self Care | Source: Ambulatory Visit | Attending: Nurse Practitioner

## 2024-02-03 DIAGNOSIS — D1801 Hemangioma of skin and subcutaneous tissue: Secondary | ICD-10-CM | POA: Diagnosis not present

## 2024-02-03 DIAGNOSIS — N6314 Unspecified lump in the right breast, lower inner quadrant: Secondary | ICD-10-CM | POA: Diagnosis not present

## 2024-02-03 DIAGNOSIS — N644 Mastodynia: Secondary | ICD-10-CM

## 2024-02-03 DIAGNOSIS — L814 Other melanin hyperpigmentation: Secondary | ICD-10-CM | POA: Diagnosis not present

## 2024-02-03 DIAGNOSIS — L821 Other seborrheic keratosis: Secondary | ICD-10-CM | POA: Diagnosis not present

## 2024-02-19 DIAGNOSIS — I6523 Occlusion and stenosis of bilateral carotid arteries: Secondary | ICD-10-CM | POA: Diagnosis not present

## 2024-02-26 ENCOUNTER — Encounter: Payer: Self-pay | Admitting: Nurse Practitioner

## 2024-02-26 ENCOUNTER — Ambulatory Visit: Admitting: Nurse Practitioner

## 2024-02-26 ENCOUNTER — Ambulatory Visit: Payer: Self-pay

## 2024-02-26 VITALS — BP 114/78 | HR 88 | Temp 98.3°F | Ht 66.0 in | Wt 169.0 lb

## 2024-02-26 DIAGNOSIS — R0781 Pleurodynia: Secondary | ICD-10-CM | POA: Diagnosis not present

## 2024-02-26 DIAGNOSIS — M2550 Pain in unspecified joint: Secondary | ICD-10-CM

## 2024-02-26 MED ORDER — VALACYCLOVIR HCL 1 G PO TABS: 1000.0000 mg | ORAL_TABLET | Freq: Three times a day (TID) | ORAL | 0 refills | Status: AC

## 2024-02-26 MED ORDER — LIDOCAINE VISCOUS HCL 2 % MT SOLN: 15.0000 mL | OROMUCOSAL | 0 refills | Status: DC | PRN

## 2024-02-26 NOTE — Patient Instructions (Signed)
 It was great to see you!  We are checking your labs today and will let you know the results via mychart/phone.   Start capsaicin cream as needed to your right ribs  Start valtrex  1 tablet 3 times a day for 7 days   Let's follow-up if symptoms worsen or don't improve   Take care,  Tinnie Harada, NP

## 2024-02-26 NOTE — Progress Notes (Signed)
 Acute Office Visit  Subjective:     Patient ID: Wanda Cortez, female    DOB: 1970-11-17, 53 y.o.   MRN: [id number]  Chief Complaint  Patient presents with   Pain    Pain around right ribs and under breast, joint pain for 3 days and questions if Shingles    HPI Patient is in today for severe pain around her right ribs and under her breast for 3 days. She does have a history of shingles and this feel similar. She describes the pain as ants crawling across her rib. She denies rash, fever. She does have multiple joint pain in her hands, wrists, knees, hips, and ankles. She was in [place]  last month and did pull 2 ticks off of her. She denies sick contacts. She has taken tylenol  but it did not help.   ROS See pertinent positives and negatives per HPI.     Objective:    BP 114/78 (BP Location: Left Arm, Patient Position: Sitting, Cuff Size: Normal)   Pulse 88   Temp 98.3 F (36.8 C) (Oral)   Ht 5' 6 (1.676 m)   Wt 169 lb (76.7 kg)   LMP 09/01/2020   SpO2 98%   BMI 27.28 kg/m    Physical Exam Vitals and nursing note reviewed.  Constitutional:      General: She is not in acute distress.    Appearance: Normal appearance.  HENT:     Head: Normocephalic.  Eyes:     Conjunctiva/sclera: Conjunctivae normal.  Cardiovascular:     Rate and Rhythm: Normal rate and regular rhythm.     Pulses: Normal pulses.     Heart sounds: Normal heart sounds.  Pulmonary:     Effort: Pulmonary effort is normal.     Breath sounds: Normal breath sounds.  Musculoskeletal:        General: Tenderness (right ribs) present.     Cervical back: Normal range of motion.  Skin:    General: Skin is warm.     Findings: No erythema or rash.  Neurological:     General: No focal deficit present.     Mental Status: She is alert and oriented to person, place, and time.  Psychiatric:        Mood and Affect: Mood normal.        Behavior: Behavior normal.        Thought Content: Thought content normal.         Judgment: Judgment normal.      Assessment & Plan:   Problem List Items Addressed This Visit   None Visit Diagnoses       Multiple joint pain    -  Primary   With recent ticks, will check for lyme and RMSF. Continue tylenol  as needed for pain.   Relevant Orders   B. burgdorfi antibodies   Rocky mtn spotted fvr abs pnl(IgG+IgM)     Rib pain on right side       Concern for possible shingles, even though no rash currently. Start valtrex  1g TID x7 days. Continue tylenol  prn pain. Can also try capsaicin cream.       Meds ordered this encounter  Medications   valACYclovir  (VALTREX ) 1000 MG tablet    Sig: Take 1 tablet (1,000 mg total) by mouth 3 (three) times daily for 7 days.    Dispense:  21 tablet    Refill:  0   DISCONTD: lidocaine  (XYLOCAINE ) 2 % solution    Sig: Use as  directed 15 mLs in the mouth or throat as needed for mouth pain.    Dispense:  100 mL    Refill:  0    Return if symptoms worsen or fail to improve.  Tinnie DELENA Harada, NP

## 2024-02-26 NOTE — Telephone Encounter (Signed)
 FYI Only or Action Required?: FYI only for provider.  Patient was last seen in primary care on 02/02/2024 by Nche, Roselie [place], NP.  Called Nurse Triage reporting Pain.  Symptoms began several days ago.  Interventions attempted: Other: wearing loose clothing.  Symptoms are: gradually worsening.  Triage Disposition: See Physician Within 24 Hours  Patient/caregiver understands and will follow disposition?: Yes  Copied from CRM #[id number]. Topic: Clinical - Red Word Triage >> Feb 26, 2024 12:43 PM Drema MATSU wrote: Red Word that prompted transfer to Nurse Triage: Patient is having pain on the side of her rib along the nerve and states she gets that before she gets shingles. Symptoms: very fatigue and pain in ribs   ----------------------------------------------------------------------- From previous Reason for Contact - Scheduling: Patient/patient representative is calling to schedule an appointment. Refer to attachments for appointment information. Reason for Disposition  SEVERE pain (e.g., excruciating)  Answer Assessment - Initial Assessment Questions 1. APPEARANCE of RASH: What does the rash look like?      None at this time but pain feels the exact same as previous shingles.  2. LOCATION: Where is the rash located?      Rib-right sided  3. ONSET: When did the rash start?      3 days ago  4. ITCHING: Does the rash itch? If Yes, ask: How bad is the itch?  (Scale 1-10; or mild, moderate, severe)     Denies  5. PAIN: Does the rash hurt? If Yes, ask: How bad is the pain?  (Scale 0-10; or none, mild, moderate, severe)     Severe Unique shingle pain  6. OTHER SYMPTOMS: Do you have any other symptoms? (e.g., fever)     Joints are achy.  Protocols used: Shingles (Zoster)-A-AH

## 2024-02-26 NOTE — Telephone Encounter (Signed)
 Noted. Patient is scheduled for 340pm today with Tinnie Harada, NP

## 2024-03-01 ENCOUNTER — Ambulatory Visit: Payer: Self-pay | Admitting: Nurse Practitioner

## 2024-03-02 LAB — ROCKY MTN SPOTTED FVR ABS PNL(IGG+IGM)
RMSF IgG: NOT DETECTED
RMSF IgM: NOT DETECTED

## 2024-03-02 LAB — B. BURGDORFI ANTIBODIES: B burgdorferi Ab IgG+IgM: <0.9 {index}

## 2024-03-12 ENCOUNTER — Ambulatory Visit: Admitting: Nurse Practitioner

## 2024-03-12 ENCOUNTER — Encounter: Payer: Self-pay | Admitting: Nurse Practitioner

## 2024-03-12 VITALS — BP 112/74 | HR 79 | Temp 98.5°F | Ht 66.0 in | Wt 170.4 lb

## 2024-03-12 DIAGNOSIS — G8929 Other chronic pain: Secondary | ICD-10-CM

## 2024-03-12 DIAGNOSIS — M25561 Pain in right knee: Secondary | ICD-10-CM | POA: Diagnosis not present

## 2024-03-12 DIAGNOSIS — F418 Other specified anxiety disorders: Secondary | ICD-10-CM | POA: Diagnosis not present

## 2024-03-12 MED ORDER — HYDROXYZINE HCL 25 MG PO TABS: 25.0000 mg | ORAL_TABLET | Freq: Three times a day (TID) | ORAL | 0 refills | Status: DC | PRN

## 2024-03-12 NOTE — Progress Notes (Signed)
 Established Patient Visit  Patient: KEVIA ZAUCHA   DOB: 1971-07-09   53 y.o. Female  MRN: [id number] Visit Date: 03/12/2024  Subjective:    Chief Complaint  Patient presents with   Knee Pain    Right knee achy to sharp pain 5 days ago-referral  Discuss Anxiety medications and alternatives    HPI Chronic pain of right knee Acute on chronic knee pain, re injury by twisting 4days ago, some improvement with knee brace at bedtime and use of tylenol  500mg  every 6hrs. Unable to use oral or topical NSAIDs, and oral prednisone . Hx of right lateral meniscus tear and s/p R. arthroscopy with meniscectomy in 2018  Advised to use knee brace in daytime and lidocaine  patch Entered sports medicine referral  Situational anxiety Has upcoming trip to [place] . Request to use vistaril  in place if clonazepam .  Prescription sent  Reviewed medical, surgical, and social history today  Medications: Outpatient Medications Prior to Visit  Medication Sig   albuterol  (PROVENTIL ) (2.5 MG/3ML) 0.083% nebulizer solution Take 3 mLs (2.5 mg total) by nebulization every 6 (six) hours as needed for wheezing or shortness of breath.   albuterol  (VENTOLIN  HFA) 108 (90 Base) MCG/ACT inhaler INHALE ONE TO TWO PUFFS BY MOUTH EVERY 6 HOURS AS NEEDED FOR SHORTNESS OF BREATH OR FOR WHEEZING   Ascorbic Acid (VITAMIN C) 100 MG CHEW Chew 1 tablet by mouth daily.   B Complex Vitamins (VITAMIN B COMPLEX) TABS Take 1 tablet by mouth daily.   BIOTIN PO Take 1,250 mcg by mouth daily.   Spacer/Aero-Holding Chambers DEVI 1 Units by Does not apply route daily at 12 noon.   No facility-administered medications prior to visit.   Reviewed past medical and social history.   ROS per HPI above      Objective:  BP 112/74 (BP Location: Left Arm, Patient Position: Sitting, Cuff Size: Large)   Pulse 79   Temp 98.5 F (36.9 C) (Oral)   Ht 5' 6 (1.676 m)   Wt 170 lb 6.4 oz (77.3 kg)   LMP 09/01/2020   SpO2 100%    BMI 27.50 kg/m      Physical Exam Vitals and nursing note reviewed.  Musculoskeletal:     Right knee: Swelling and crepitus present. No effusion or erythema. Decreased range of motion. Tenderness present over the lateral joint line. No medial joint line or patellar tendon tenderness.     Instability Tests: Medial McMurray test positive.     Right lower leg: Normal.  Neurological:     Mental Status: She is alert.     No results found for any visits on 03/12/24.    Assessment & Plan:    Problem List Items Addressed This Visit     Chronic pain of right knee - Primary   Acute on chronic knee pain, re injury by twisting 4days ago, some improvement with knee brace at bedtime and use of tylenol  500mg  every 6hrs. Unable to use oral or topical NSAIDs, and oral prednisone . Hx of right lateral meniscus tear and s/p R. arthroscopy with meniscectomy in 2018  Advised to use knee brace in daytime and lidocaine  patch Entered sports medicine referral      Relevant Orders   Ambulatory referral to Sports Medicine   Situational anxiety   Has upcoming trip to [place] . Request to use vistaril  in place if clonazepam .  Prescription sent      Relevant Medications  hydrOXYzine  (ATARAX ) 25 MG tablet   Return if symptoms worsen or fail to improve.     Roselie Mood, NP

## 2024-03-12 NOTE — Assessment & Plan Note (Signed)
 Has upcoming trip to [place] . Request to use vistaril  in place if clonazepam .  Prescription sent

## 2024-03-12 NOTE — Assessment & Plan Note (Addendum)
 Acute on chronic knee pain, re injury by twisting 4days ago, some improvement with knee brace at bedtime and use of tylenol  500mg  every 6hrs. Unable to use oral or topical NSAIDs, and oral prednisone . Hx of right lateral meniscus tear and s/p R. arthroscopy with meniscectomy in 2018  Advised to use knee brace in daytime and lidocaine  patch Entered sports medicine referral

## 2024-03-12 NOTE — Patient Instructions (Addendum)
 Use knee brace, lidocaine  patch, and cold compress. You will be contacted to schedule appointment with sports medicine  Acute Knee Pain, Adult Many things can cause knee pain. Sometimes, knee pain is sudden (acute). It may be caused by damage, swelling, or irritation of the muscles and tissues that support your knee. Pain may come from: A fall. An injury to the knee from twisting motions. A hit to the knee. Infection. The pain often goes away on its own with time and rest. If the pain does not go away, tests may be done to find out what is causing the pain. These may include: Imaging tests, such as an X-ray, MRI, CT scan, or ultrasound. Joint aspiration. In this test, fluid is removed from the knee and checked. Arthroscopy. In this test, a lighted tube is put in the knee and an image is shown on a screen. A biopsy. In this test, a health care provider will remove a small piece of tissue for testing. Follow these instructions at home: If you have a knee sleeve or brace that can be taken off:  Wear the knee sleeve or brace as told by your provider. Take it off only if your provider says that you can. Check the skin around it every day. Tell your provider if you see problems. Loosen the knee sleeve or brace if your toes tingle, are numb, or turn cold and blue. Keep the knee sleeve or brace clean and dry. Bathing If the knee sleeve or brace is not waterproof: Do not let it get wet. Cover it when you take a bath or shower. Use a cover that does not let any water in. Managing pain, stiffness, and swelling  If told, put ice on the area. If you have a knee sleeve or brace that you can take off, remove it as told. Put ice in a plastic bag. Place a towel between your skin and the bag. Leave the ice on for 20 minutes, 2-3 times a day. If your skin turns bright red, take off the ice right away to prevent skin damage. The risk of damage is higher if you cannot feel pain, heat, or cold. Move your  toes often to reduce stiffness and swelling. Raise the injured area above the level of your heart while you are sitting or lying down. Use a pillow to support your foot as needed. If told, use an elastic bandage to put pressure (compression) on your injured knee. This may control swelling, give support, and help with discomfort. Sleep with a pillow under your knee. Activity Rest your knee. Do not do things that cause pain or make pain worse. Do not stand or walk on your injured knee until you're told it's okay. Use crutches as told. Avoid activities where both feet leave the ground at the same time and put stress on the joints. Avoid running, jumping rope, and doing jumping jacks. Work with a physical therapist to make a safe exercise program if told. Physical therapy helps your knee move better and get stronger. Exercise as told. General instructions Take your medicines only as told by your provider. If you are overweight, work with your provider and an expert in healthy eating, called a dietician, to set goals to lose weight. Being overweight can make your knee hurt more. Do not smoke, vape, or use products with nicotine or tobacco in them. If you need help quitting, talk with your provider. Return to normal activities when you are told. Ask what things are safe  for you to do. Watch for any changes in your symptoms. Keep all follow-up visits. Your provider will check your healing and adjust treatments if needed. Contact a health care provider if: The knee pain does not stop. The knee pain changes or gets worse. You have a fever along with knee pain. Your knee is red or feels warm when you touch it. Your knee gives out or locks up. Get help right away if: Your knee swells and the swelling gets worse. You cannot move your knee. You have very bad knee pain that does not get better with medicine. This information is not intended to replace advice given to you by your health care provider.  Make sure you discuss any questions you have with your health care provider. Document Revised: 04/10/2023 Document Reviewed: 09/02/2022 Elsevier Patient Education  2024 [employer].

## 2024-03-31 NOTE — Progress Notes (Unsigned)
 Wanda Cortez Wanda [facility]ez [facility] [address] [place] [address] Phone: [phone]   Assessment and Plan:     1. Chronic pain of right knee (Primary) 2. Recurrent right knee instability 3. History of meniscal tear - Chronic with exacerbation, initial visit - Concern for lateral meniscal pathology based on knee locking and instability episodes, lateral knee pain, positive Thessaly and McMurray, history of right knee meniscal repair in 2018 - Recommend further evaluation with right knee MRI based on 8+ weeks of pain and instability, x-ray imaging, physical exam, pain >6/10, pain with day-to-day activities - Start HEP for knee - Use Tylenol  500 to 1000 mg tablets 2-3 times a day for day-to-day pain relief - Patient cannot tolerate NSAIDs or prednisone  due to mouth swelling and concern for anaphylaxis - X-ray obtained in clinic.  My interpretation: No acute fracture or dislocation.  Mild medial compartment degenerative changes and mild medial patellar spurring  15 additional minutes spent for educating Therapeutic Home Exercise Program.  This included exercises focusing on stretching, strengthening, with focus on eccentric aspects.   Long term goals include an improvement in range of motion, strength, endurance as well as avoiding reinjury. Patient's frequency would include in 1-2 times a day, 3-5 times a week for a duration of 6-12 weeks. Proper technique shown and discussed handout in great detail with ATC.  All questions were discussed and answered.      Pertinent previous records reviewed include none   Follow Up: 5 days after MRI to review results and discuss treatment plan   Subjective:   I, Wanda Cortez, am serving as a Neurosurgeon for Doctor Wanda Cortez  Chief Complaint: right knee pain   HPI:   04/01/2024 Patient is a 53 year old female with right knee pain. Patient states right knee pain 2 months ago. She had a twisting motion injury.  Tylenol  helps a little. Pain radiates up and down the leg. Some tingling no numbness. She states she feels a tendon rolling over on the lateral aspect of her knee      Relevant Historical Information: Hypertension, GERD  Additional pertinent review of systems negative.   Current Outpatient Medications:    albuterol  (PROVENTIL ) (2.5 MG/3ML) 0.083% nebulizer solution, Take 3 mLs (2.5 mg total) by nebulization every 6 (six) hours as needed for wheezing or shortness of breath., Disp: 75 mL, Rfl: 12   albuterol  (VENTOLIN  HFA) 108 (90 Base) MCG/ACT inhaler, INHALE ONE TO TWO PUFFS BY MOUTH EVERY 6 HOURS AS NEEDED FOR SHORTNESS OF BREATH OR FOR WHEEZING, Disp: 8.5 g, Rfl: 0   Ascorbic Acid (VITAMIN C) 100 MG CHEW, Chew 1 tablet by mouth daily., Disp: , Rfl:    B Complex Vitamins (VITAMIN B COMPLEX) TABS, Take 1 tablet by mouth daily., Disp: , Rfl:    BIOTIN PO, Take 1,250 mcg by mouth daily., Disp: , Rfl:    hydrOXYzine  (ATARAX ) 25 MG tablet, Take 1 tablet (25 mg total) by mouth 3 (three) times daily as needed., Disp: 21 tablet, Rfl: 0   Spacer/Aero-Holding Chambers DEVI, 1 Units by Does not apply route daily at 12 noon., Disp: 1 Units, Rfl: 0   Objective:     Vitals:   04/01/24 1028  BP: (!) 140/82  Pulse: 80  SpO2: 99%  Weight: 172 lb (78 kg)  Height: 5' 6 (1.676 m)      Body mass index is 27.76 kg/m.    Physical Exam:    General:  awake, alert oriented, no acute distress nontoxic Skin: no suspicious lesions or rashes Neuro:sensation intact and strength 5/5 with no deficits, no atrophy, normal muscle tone Psych: No signs of anxiety, depression or other mood disorder  Right knee: Mild swelling No deformity Neg fluid wave, joint milking ROM Flex 110, Ext 0 TTP patella, plica, lateral joint line, medial and lateral femoral condyle NTTP over the quad tendon,    patella tendon, tibial tuberostiy, fibular head, posterior fossa, pes anserine bursa, gerdy's tubercle, medial jt line,    Neg anterior and posterior drawer Neg lachman Neg sag sign Negative varus stressfor laxity, but reproduced lateral pain Negative valgus stress  Positive McMurray for painful palpable pop Positive Thessaly  Gait normal    Electronically signed by:  W[facility] Cortez [facility] 10:56 AM 04/01/24

## 2024-04-01 ENCOUNTER — Ambulatory Visit (INDEPENDENT_AMBULATORY_CARE_PROVIDER_SITE_OTHER)

## 2024-04-01 ENCOUNTER — Ambulatory Visit (INDEPENDENT_AMBULATORY_CARE_PROVIDER_SITE_OTHER): Admitting: Sports Medicine

## 2024-04-01 VITALS — BP 140/82 | HR 80 | Ht 66.0 in | Wt 172.0 lb

## 2024-04-01 DIAGNOSIS — M2351 Chronic instability of knee, right knee: Secondary | ICD-10-CM

## 2024-04-01 DIAGNOSIS — M25561 Pain in right knee: Secondary | ICD-10-CM

## 2024-04-01 DIAGNOSIS — G8929 Other chronic pain: Secondary | ICD-10-CM | POA: Diagnosis not present

## 2024-04-01 DIAGNOSIS — M25461 Effusion, right knee: Secondary | ICD-10-CM | POA: Diagnosis not present

## 2024-04-01 DIAGNOSIS — Z87828 Personal history of other (healed) physical injury and trauma: Secondary | ICD-10-CM | POA: Diagnosis not present

## 2024-04-01 DIAGNOSIS — M1711 Unilateral primary osteoarthritis, right knee: Secondary | ICD-10-CM | POA: Diagnosis not present

## 2024-04-01 NOTE — Patient Instructions (Signed)
 Knee HEP   MRI right   Tylenol  [phone] mg 2-3 times a day for pain relief    Follow up 5 days after to discuss results

## 2024-04-03 ENCOUNTER — Inpatient Hospital Stay
Admission: RE | Admit: 2024-04-03 | Discharge: 2024-04-03 | Discharge status: Home / Self Care | Source: Ambulatory Visit | Attending: Sports Medicine

## 2024-04-03 DIAGNOSIS — Z87828 Personal history of other (healed) physical injury and trauma: Secondary | ICD-10-CM

## 2024-04-03 DIAGNOSIS — G8929 Other chronic pain: Secondary | ICD-10-CM

## 2024-04-03 DIAGNOSIS — M23221 Derangement of posterior horn of medial meniscus due to old tear or injury, right knee: Secondary | ICD-10-CM | POA: Diagnosis not present

## 2024-04-03 DIAGNOSIS — M2351 Chronic instability of knee, right knee: Secondary | ICD-10-CM

## 2024-04-07 NOTE — Progress Notes (Unsigned)
 [facility] [address] [place] [address] Phone: [phone]   Assessment and Plan:     1. Chronic pain of right knee (Primary) 2. Recurrent right knee instability 3. History of meniscal tear -Chronic with exacerbation, subsequent visit - Reviewed patient's MRI which showed moderate osteoarthritis, new posterior medial horizontal meniscal tear without displacement - Patient cannot tolerate NSAIDs or prednisone  due to mouth swelling and concern for anaphylaxis.  Patient cannot tolerate CSI due to full body rash - Continue HEP - Use Tylenol  500 to 1000 mg tablets 2-3 times a day for day-to-day pain relief   4. Acute pain of left shoulder 5. Rotator cuff tendinitis, left -Acute, initial visit - Most consistent with rotator cuff tendinitis, primarily supraspinatus and subscapularis - Patient cannot tolerate NSAIDs or prednisone  due to mouth swelling and concern for anaphylaxis.  Patient cannot tolerate CSI due to full body rash - Start HEP for rotator cuff - Use Tylenol  500 to 1000 mg tablets 2-3 times a day for day-to-day pain relief   15 additional minutes spent for educating Therapeutic Home Exercise Program.  This included exercises focusing on stretching, strengthening, with focus on eccentric aspects.   Long term goals include an improvement in range of motion, strength, endurance as well as avoiding reinjury. Patient's frequency would include in 1-2 times a day, 3-5 times a week for a duration of 6-12 weeks. Proper technique shown and discussed handout in great detail with ATC.  All questions were discussed and answered.    Pertinent previous records reviewed include right knee MRI 04/03/2024   Follow Up: As needed.  Could discuss advanced imaging versus ultrasound versus ECSWT   Subjective:   I, Rishon Thilges, am serving as a Neurosurgeon for Doctor Morene Mace   Chief Complaint: left shoulder pain    HPI:    04/08/2024 Patient is a 53 year old female with left shoulder pain. Patient states 2 weeks ago when she rest her arm she has a sharp pain in her shoulder that radiates throughout the shoulder and down the arm. Does endorse numbness and tingling to her fingers. Tylenol  does not help. No true injury. Decreased ROM. Decreased grip strength. Massage therapist does not think it is muscular .    Relevant Historical Information: Hypertension, GERD  Additional pertinent review of systems negative.   Current Outpatient Medications:    albuterol  (PROVENTIL ) (2.5 MG/3ML) 0.083% nebulizer solution, Take 3 mLs (2.5 mg total) by nebulization every 6 (six) hours as needed for wheezing or shortness of breath., Disp: 75 mL, Rfl: 12   albuterol  (VENTOLIN  HFA) 108 (90 Base) MCG/ACT inhaler, INHALE ONE TO TWO PUFFS BY MOUTH EVERY 6 HOURS AS NEEDED FOR SHORTNESS OF BREATH OR FOR WHEEZING, Disp: 8.5 g, Rfl: 0   Ascorbic Acid (VITAMIN C) 100 MG CHEW, Chew 1 tablet by mouth daily., Disp: , Rfl:    B Complex Vitamins (VITAMIN B COMPLEX) TABS, Take 1 tablet by mouth daily., Disp: , Rfl:    BIOTIN PO, Take 1,250 mcg by mouth daily., Disp: , Rfl:    hydrOXYzine  (ATARAX ) 25 MG tablet, Take 1 tablet (25 mg total) by mouth 3 (three) times daily as needed., Disp: 21 tablet, Rfl: 0   Spacer/Aero-Holding Chambers DEVI, 1 Units by Does not apply route daily at 12 noon., Disp: 1 Units, Rfl: 0   Objective:     Vitals:   04/08/24 1455  Pulse: 74  SpO2: 96%  Weight: 173 lb (  78.5 kg)  Height: 5' 6 (1.676 m)      Body mass index is 27.92 kg/m.    Physical Exam:    Gen: Appears well, nad, nontoxic and pleasant Neuro:sensation intact, strength is 5/5 with df/pf/inv/ev, muscle tone wnl Skin: no suspicious lesion or defmority Psych: A&O, appropriate mood and affect  Left shoulder:  No deformity, swelling or muscle wasting No scapular winging FF 180, abd 180 with painful arc, int 10 with painful arc, ext 90 TTP  anterior shoulder musculature, coracoid, NTTP over the [place], clavicle, ac, coracoid, biceps groove, humerus, deltoid, trapezius, cervical spine Positive neer, hawkins, empty can, obriens, Negative crossarm, subscap liftoff, speeds Equivocal ant drawer with pain Negative, sulcus sign, apprehension Negative Spurling's test bilat FROM of neck    Electronically signed by:  Odis Mace [name] [facility] 3:17 PM 04/08/24

## 2024-04-08 ENCOUNTER — Ambulatory Visit: Admitting: Sports Medicine

## 2024-04-08 VITALS — HR 74 | Ht 66.0 in | Wt 173.0 lb

## 2024-04-08 DIAGNOSIS — M25561 Pain in right knee: Secondary | ICD-10-CM

## 2024-04-08 DIAGNOSIS — M25512 Pain in left shoulder: Secondary | ICD-10-CM | POA: Diagnosis not present

## 2024-04-08 DIAGNOSIS — M2351 Chronic instability of knee, right knee: Secondary | ICD-10-CM

## 2024-04-08 DIAGNOSIS — G8929 Other chronic pain: Secondary | ICD-10-CM

## 2024-04-08 DIAGNOSIS — M7582 Other shoulder lesions, left shoulder: Secondary | ICD-10-CM

## 2024-04-08 DIAGNOSIS — Z87828 Personal history of other (healed) physical injury and trauma: Secondary | ICD-10-CM | POA: Diagnosis not present

## 2024-04-08 NOTE — Patient Instructions (Signed)
 Shoulder HEP  Tylenol [phone] mg 2-3 times a day for pain relief  As needed follow up

## 2024-04-13 ENCOUNTER — Ambulatory Visit: Admitting: Sports Medicine

## 2024-04-29 DIAGNOSIS — M25551 Pain in right hip: Secondary | ICD-10-CM | POA: Diagnosis not present

## 2024-04-29 DIAGNOSIS — G8929 Other chronic pain: Secondary | ICD-10-CM | POA: Diagnosis not present

## 2024-04-29 DIAGNOSIS — M545 Low back pain, unspecified: Secondary | ICD-10-CM | POA: Diagnosis not present

## 2024-04-29 DIAGNOSIS — N941 Unspecified dyspareunia: Secondary | ICD-10-CM | POA: Diagnosis not present

## 2024-05-03 ENCOUNTER — Encounter: Payer: Self-pay | Admitting: Nurse Practitioner

## 2024-05-03 ENCOUNTER — Ambulatory Visit: Admitting: Nurse Practitioner

## 2024-05-03 VITALS — BP 118/76 | HR 80 | Temp 98.6°F | Ht 66.0 in | Wt 174.2 lb

## 2024-05-03 DIAGNOSIS — G8929 Other chronic pain: Secondary | ICD-10-CM

## 2024-05-03 DIAGNOSIS — R590 Localized enlarged lymph nodes: Secondary | ICD-10-CM

## 2024-05-03 DIAGNOSIS — R0789 Other chest pain: Secondary | ICD-10-CM | POA: Diagnosis not present

## 2024-05-03 NOTE — Progress Notes (Signed)
 Acute Office Visit  Subjective:    Patient ID: Wanda Cortez, female    DOB: November 19, 1970, 53 y.o.   MRN: [id number]  Chief Complaint  Patient presents with   Mass    Bump located under her left arm pit x 2 days. Pt states she noticed at bump that was painful. Now the bump is gone but still tender. No drainage, rash, or blistering.     HPI Patient is in today for acute left axilla tenderness x 2days. No joint pain, no breast pain, no skin redness or swelling. Use of tylenol  with some improvement. She shaves daily with a razor.  Outpatient Medications Prior to Visit  Medication Sig   albuterol  (PROVENTIL ) (2.5 MG/3ML) 0.083% nebulizer solution Take 3 mLs (2.5 mg total) by nebulization every 6 (six) hours as needed for wheezing or shortness of breath.   albuterol  (VENTOLIN  HFA) 108 (90 Base) MCG/ACT inhaler INHALE ONE TO TWO PUFFS BY MOUTH EVERY 6 HOURS AS NEEDED FOR SHORTNESS OF BREATH OR FOR WHEEZING   Ascorbic Acid (VITAMIN C) 100 MG CHEW Chew 1 tablet by mouth daily.   B Complex Vitamins (VITAMIN B COMPLEX) TABS Take 1 tablet by mouth daily.   BIOTIN PO Take 1,250 mcg by mouth daily.   hydrOXYzine  (ATARAX ) 25 MG tablet Take 1 tablet (25 mg total) by mouth 3 (three) times daily as needed.   Spacer/Aero-Holding Chambers DEVI 1 Units by Does not apply route daily at 12 noon.   Estradiol  (IMVEXXY  MAINTENANCE PACK) 10 MCG INST Place 10 mcg vaginally as directed.   No facility-administered medications prior to visit.    Reviewed past medical and social history.  Review of Systems Per HPI     Objective:    Physical Exam Vitals reviewed.  Chest:  Breasts:    Left: Normal.  Lymphadenopathy:     Upper Body:     Right upper body: No supraclavicular, axillary or pectoral adenopathy.     Left upper body: Axillary adenopathy present. No supraclavicular or pectoral adenopathy.  Skin:    General: Skin is warm and dry.     Findings: No bruising, erythema, lesion or rash.    BP  118/76   Pulse 80   Temp 98.6 F (37 C)   Ht 5' 6 (1.676 m)   Wt 174 lb 3.2 oz (79 kg)   LMP 09/01/2020   SpO2 97%   BMI 28.12 kg/m    No results found for any visits on 05/03/24.     Assessment & Plan:   Problem List Items Addressed This Visit     Chronic chest wall pain   Chronic intermittent right lower chest wall and RUQ pain, no known trigger No rash, no injury. Normal MRI ABDOMEN, CXR, normal lipase, CMP, CBC.  Likely musculoskeletal. She agreed to PT referral. She will call office with name of PT of her choice.      Other Visit Diagnoses       Axillary lymphadenopathy    -  Primary     Apply warm compress 2-3x/day to left axilla region. Advised to decrease shaving frequency Ok to use tylenol  as needed for pain. Call office if no improvement in 1-2week. Call office for oral antibiotics if any redness, worsening pain, or abscess Call office with name and fax of number of physical therapist.  No orders of the defined types were placed in this encounter.  Return if symptoms worsen or fail to improve.    Roselie Mood, NP

## 2024-05-03 NOTE — Assessment & Plan Note (Addendum)
 Chronic intermittent right lower chest wall and RUQ pain, no known trigger No rash, no injury. Normal MRI ABDOMEN, CXR, normal lipase, CMP, CBC.  Likely musculoskeletal. She agreed to PT referral. She will call office with name of PT of her choice.

## 2024-05-03 NOTE — Patient Instructions (Signed)
 Apply warm compress 2-3x/day Ok to use tylenol  as needed for pain. Call office if no improvement in 1-2week. Call office for oral antibiotics if any redness, worsening pain, or abscess Call office with name and fax of number of physical therapist.

## 2024-05-05 ENCOUNTER — Encounter: Payer: Self-pay | Admitting: Nurse Practitioner

## 2024-05-05 DIAGNOSIS — G8929 Other chronic pain: Secondary | ICD-10-CM

## 2024-05-05 NOTE — Telephone Encounter (Signed)
 [facility] [address] [place], [place], [place] [address] Phone: [phone] Fax: [phone]

## 2024-05-05 NOTE — Telephone Encounter (Signed)
 Pt provided the name of the PT suggested.

## 2024-05-12 DIAGNOSIS — M546 Pain in thoracic spine: Secondary | ICD-10-CM | POA: Diagnosis not present

## 2024-05-12 DIAGNOSIS — M545 Low back pain, unspecified: Secondary | ICD-10-CM | POA: Diagnosis not present

## 2024-05-21 DIAGNOSIS — M545 Low back pain, unspecified: Secondary | ICD-10-CM | POA: Diagnosis not present

## 2024-05-21 DIAGNOSIS — M546 Pain in thoracic spine: Secondary | ICD-10-CM | POA: Diagnosis not present

## 2024-05-25 DIAGNOSIS — M546 Pain in thoracic spine: Secondary | ICD-10-CM | POA: Diagnosis not present

## 2024-05-25 DIAGNOSIS — M545 Low back pain, unspecified: Secondary | ICD-10-CM | POA: Diagnosis not present

## 2024-06-08 DIAGNOSIS — J029 Acute pharyngitis, unspecified: Secondary | ICD-10-CM | POA: Diagnosis not present

## 2024-06-08 DIAGNOSIS — J4520 Mild intermittent asthma, uncomplicated: Secondary | ICD-10-CM | POA: Diagnosis not present

## 2024-06-13 DIAGNOSIS — R051 Acute cough: Secondary | ICD-10-CM | POA: Diagnosis not present

## 2024-07-13 ENCOUNTER — Other Ambulatory Visit: Payer: Self-pay | Admitting: Nurse Practitioner

## 2024-07-13 DIAGNOSIS — F418 Other specified anxiety disorders: Secondary | ICD-10-CM

## 2024-08-18 ENCOUNTER — Ambulatory Visit: Payer: Self-pay

## 2024-08-18 NOTE — Telephone Encounter (Signed)
 FYI Only or Action Required?: FYI only for provider: ED advised.Patient refused and will go to urgent care instead.  Patient was last seen in primary care on 05/03/2024 by Nche, Roselie Rockford, NP.  Called Nurse Triage reporting Chest Pain and Sore Throat.  Symptoms began several days ago.  Interventions attempted: Nothing.  Symptoms are: gradually worsening.  Triage Disposition: Go to ED Now (overriding Call EMS 911 Now)  Patient/caregiver understands and will follow disposition?: No             Message from Taleah C sent at 08/18/2024  1:05 PM EST  Reason for Triage: chest pain, hard to swallow, temperature high around 100 for 4 days   Reason for Disposition  [1] Chest pain lasts > 5 minutes AND [2] age > 20  Answer Assessment - Initial Assessment Questions Sick last week, felt better and symptoms returned 4 days ago. Currently having constant chest tightness (does not feel like normal asthma tightness) and chest pain along lower ribs and back (not caused by coughing, worse when breathing in). Also c/o: nasal congestion, ear pain, sides of lower ribs and back pain, painful swallowing (burning), body aches, intermittent coughing, fatigue. No difficulty swallowing.  RN advised go to ED. Patient declined and states she thinks it is walking pneumonia and would catch COVID if she goes to ED, will go to urgent care instead.    Home COVID negative  Protocols used: Chest Pain-A-AH

## 2024-08-18 NOTE — Telephone Encounter (Signed)
 Patient left a message with E[facility]  reporting chest pain  and sore throat  beginning several days ago and gradually worsening. Patient was triaged to go th ED immediately. Patient refused and went to Urgent care. I called patient to follow up.  I called patient to follow up in regards to urgent care visit, patient stated she was not seen in urgent care due to long wait time. I asked patient her symptoms and she stated she was experiencing congestion, high temperature, ear pain and chest pain related to coughing and congestion. Patient believes she has walking pneumonia. I advised  patient to go to ED due symptoms and not being seen at urgent care. Patient refused and stated I would prefer to go to the urgent care  tomorrow when the weather warms up. I advised patient to go to ED if symptoms worsened and that I would follow up tomorrow to see if she had been treated at ED or urgent care. Forwarding to PCP.

## 2024-08-19 ENCOUNTER — Telehealth: Admitting: Physician Assistant

## 2024-08-19 DIAGNOSIS — J020 Streptococcal pharyngitis: Secondary | ICD-10-CM

## 2024-08-19 MED ORDER — AMOXICILLIN 500 MG PO CAPS: 500.0000 mg | ORAL_CAPSULE | Freq: Two times a day (BID) | ORAL | 0 refills | Status: DC

## 2024-08-19 NOTE — Patient Instructions (Signed)
 " Wanda [facility], thank you for joining Wanda CHRISTELLA Dickinson, PA-C for today's virtual visit.  While this provider is not your primary care provider (PCP), if your PCP is located in our provider database this encounter information will be shared with them immediately following your visit.   A [facility] MyChart account gives you access to today's visit and all your visits, tests, and labs performed at [facility]  click here if you don't have a [facility] MyChart account or go to [email or web address]  Consent: (Patient) Wanda PARAS Haynes provided verbal consent for this virtual visit at the beginning of the encounter.  Current Medications:  Current Outpatient Medications:    amoxicillin  (AMOXIL ) 500 MG capsule, Take 1 capsule (500 mg total) by mouth 2 (two) times daily for 10 days., Disp: 20 capsule, Rfl: 0   albuterol  (PROVENTIL ) (2.5 MG/3ML) 0.083% nebulizer solution, Take 3 mLs (2.5 mg total) by nebulization every 6 (six) hours as needed for wheezing or shortness of breath., Disp: 75 mL, Rfl: 12   albuterol  (VENTOLIN  HFA) 108 (90 Base) MCG/ACT inhaler, INHALE ONE TO TWO PUFFS BY MOUTH EVERY 6 HOURS AS NEEDED FOR SHORTNESS OF BREATH OR FOR WHEEZING, Disp: 8.5 g, Rfl: 0   Ascorbic Acid (VITAMIN C) 100 MG CHEW, Chew 1 tablet by mouth daily., Disp: , Rfl:    B Complex Vitamins (VITAMIN B COMPLEX) TABS, Take 1 tablet by mouth daily., Disp: , Rfl:    BIOTIN PO, Take 1,250 mcg by mouth daily., Disp: , Rfl:    Estradiol  (IMVEXXY  MAINTENANCE PACK) 10 MCG INST, Place 10 mcg vaginally as directed., Disp: , Rfl:    hydrOXYzine  (ATARAX ) 25 MG tablet, TAKE ONE TABLET BY MOUTH THREE TIMES A DAY AS NEEDED, Disp: 21 tablet, Rfl: 0   Spacer/Aero-Holding Chambers DEVI, 1 Units by Does not apply route daily at 12 noon., Disp: 1 Units, Rfl: 0   Medications ordered in this encounter:  Meds ordered this encounter  Medications   amoxicillin  (AMOXIL ) 500 MG capsule    Sig: Take 1 capsule (500 mg  total) by mouth 2 (two) times daily for 10 days.    Dispense:  20 capsule    Refill:  0    Supervising Provider:   BLAISE ALEENE KIDD [[id number]]     *If you need refills on other medications prior to your next appointment, please contact your pharmacy*  Follow-Up: Call back or seek an in-person evaluation if the symptoms worsen or if the condition fails to improve as anticipated.  [facility] [phone]  Other Instructions Strep Throat, Adult Strep throat is an infection in the throat that is caused by bacteria. It is common during the cold months of the year. It mostly affects children who are 34-45 years old. However, people of all ages can get it at any time of the year. This infection spreads from person to person (is contagious) through coughing, sneezing, or having close contact. Your health care provider may use other names to describe the infection. When strep throat affects the tonsils, it is called tonsillitis. When it affects the back of the throat, it is called pharyngitis. What are the causes? This condition is caused by the Streptococcus pyogenes bacteria. What increases the risk? You are more likely to develop this condition if: You care for school-age children, or are around school-age children. Children are more likely to get strep throat and may spread it to others. You spend time in crowded places where the infection  can spread easily. You have close contact with someone who has strep throat. What are the signs or symptoms? Symptoms of this condition include: Fever or chills. Redness, swelling, or pain in the tonsils or throat. Pain or difficulty when swallowing. White or yellow spots on the tonsils or throat. Tender glands in the neck and under the jaw. Bad smelling breath. Red rash all over the body. This is rare. How is this diagnosed? This condition is diagnosed by tests that check for the presence and the amount of bacteria that cause strep  throat. They are: Rapid strep test. Your throat is swabbed and checked for the presence of bacteria. Results are usually ready in minutes. Throat culture test. Your throat is swabbed. The sample is placed in a cup that allows infections to grow. Results are usually ready in 1 or 2 days. How is this treated? This condition may be treated with: Medicines that kill germs (antibiotics). Medicines that relieve pain or fever. These include: Ibuprofen or acetaminophen . Aspirin, only for people who are over the age of 71. Throat lozenges. Throat sprays. Follow these instructions at home: Medicines  Take over-the-counter and prescription medicines only as told by your health care provider. Take your antibiotic medicine as told by your health care provider. Do not stop taking the antibiotic even if you start to feel better. Eating and drinking  If you have trouble swallowing, try eating soft foods until your sore throat feels better. Drink enough fluid to keep your urine pale yellow. To help relieve pain, you may have: Warm fluids, such as soup and tea. Cold fluids, such as frozen desserts or popsicles. General instructions Gargle with a salt-water mixture 3-4 times a day or as needed. To make a salt-water mixture, completely dissolve -1 tsp (3-6 g) of salt in 1 cup (237 mL) of warm water. Get plenty of rest. Stay home from work or school until you have been taking antibiotics for 24 hours. Do not use any products that contain nicotine or tobacco. These products include cigarettes, chewing tobacco, and vaping devices, such as e-cigarettes. If you need help quitting, ask your health care provider. It is up to you to get your test results. Ask your health care provider, or the department that is doing the test, when your results will be ready. Keep all follow-up visits. This is important. How is this prevented?  Do not share food, drinking cups, or personal items that could cause the infection  to spread to other people. Wash your hands often with soap and water for at least 20 seconds. If soap and water are not available, use hand sanitizer. Make sure that all people in your house wash their hands well. Have family members tested if they have a sore throat or fever. They may need an antibiotic if they have strep throat. Contact a health care provider if: You have swelling in your neck that keeps getting bigger. You develop a rash, cough, or earache. You cough up a thick mucus that is green, yellow-brown, or bloody. You have pain or discomfort that does not get better with medicine. Your symptoms seem to be getting worse. You have a fever. Get help right away if: You have new symptoms, such as vomiting, severe headache, stiff or painful neck, chest pain, or shortness of breath. You have severe throat pain, drooling, or changes in your voice. You have swelling of the neck, or the skin on the neck becomes red and tender. You have signs of dehydration, such  as tiredness (fatigue), dry mouth, and decreased urination. You become increasingly sleepy, or you cannot wake up completely. Your joints become red or painful. These symptoms may represent a serious problem that is an emergency. Do not wait to see if the symptoms will go away. Get medical help right away. Call your local emergency services (911 in the [place] Do not drive yourself to the hospital. Summary Strep throat is an infection in the throat that is caused by the Streptococcus pyogenes bacteria. This infection is spread from person to person (is contagious) through coughing, sneezing, or having close contact. Take your medicines, including antibiotics, as told by your health care provider. Do not stop taking the antibiotic even if you start to feel better. To prevent the spread of germs, wash your hands well with soap and water. Have others do the same. Do not share food, drinking cups, or personal items. Get help right away if  you have new symptoms, such as vomiting, severe headache, stiff or painful neck, chest pain, or shortness of breath. This information is not intended to replace advice given to you by your health care provider. Make sure you discuss any questions you have with your health care provider. Document Revised: 10/31/2020 Document Reviewed: 10/31/2020 Elsevier Patient Education  2024 [employer]   If you have been instructed to have an in-person evaluation today at a local Urgent Care facility, please use the link below. It will take you to a list of all of our available [facility], including address, phone number and hours of operation. Please do not delay care.  [facility]  If you or a family member do not have a primary care provider, use the link below to schedule a visit and establish care. When you choose a [facility] primary care physician or advanced practice provider, you gain a long-term partner in health. Find a Primary Care Provider  Learn more about [facility]'s in-office and virtual care options: [facility] Get Care Now "

## 2024-08-19 NOTE — Progress Notes (Signed)
 " Virtual Visit Consent   Wanda Cortez, you are scheduled for a virtual visit with a [place] provider today. Just as with appointments in the office, your consent must be obtained to participate. Your consent will be active for this visit and any virtual visit you may have with one of our providers in the next 365 days. If you have a MyChart account, a copy of this consent can be sent to you electronically.  As this is a virtual visit, video technology does not allow for your provider to perform a traditional examination. This may limit your provider's ability to fully assess your condition. If your provider identifies any concerns that need to be evaluated in person or the need to arrange testing (such as labs, EKG, etc.), we will make arrangements to do so. Although advances in technology are sophisticated, we cannot ensure that it will always work on either your end or our end. If the connection with a video visit is poor, the visit may have to be switched to a telephone visit. With either a video or telephone visit, we are not always able to ensure that we have a secure connection.  By engaging in this virtual visit, you consent to the provision of healthcare and authorize for your insurance to be billed (if applicable) for the services provided during this visit. Depending on your insurance coverage, you may receive a charge related to this service.  I need to obtain your verbal consent now. Are you willing to proceed with your visit today? Tashia Leiterman Mowrer has provided verbal consent on 08/19/2024 for a virtual visit (video or telephone). Delon CHRISTELLA Dickinson, PA-C  Date: 08/19/2024 9:35 AM   Virtual Visit via Video Note   I, Delon CHRISTELLA Dickinson, connected with  Betina Puckett Starks  ([id number], 1970-08-14) on 08/19/24 at  9:30 AM EST by a video-enabled telemedicine application and verified that I am speaking with the correct person using two identifiers.  Location: Patient: Virtual Visit Location  Patient: Home Provider: Virtual Visit Location Provider: Home Office   I discussed the limitations of evaluation and management by telemedicine and the availability of in person appointments. The patient expressed understanding and agreed to proceed.    History of Present Illness: Wanda Cortez is a 54 y.o. who identifies as a female who was assigned female at birth, and is being seen today for sore throat and fevers.  HPI: Sore Throat  This is a new problem. The current episode started in the past 7 days (5 days). The problem has been gradually worsening. The maximum temperature recorded prior to her arrival was 100.4 - 100.9 F. The fever has been present for 3 to 4 days. The pain is moderate. Associated symptoms include congestion, coughing (from throat irritation), diarrhea, ear pain (left), headaches, a hoarse voice, swollen glands and trouble swallowing. Pertinent negatives include no ear discharge or vomiting. Associated symptoms comments: White exudate on left side of throat, nausea but feels drainage . She has tried acetaminophen  (oregano oil) for the symptoms. The treatment provided no relief.     Problems:  Patient Active Problem List   Diagnosis Date Noted   Flank pain 11/13/2023   Chronic chest wall pain 06/06/2023   Astigmatism of both eyes with presbyopia 06/03/2023   Dry eye syndrome of both eyes 06/03/2023   Family history of glaucoma 06/03/2023   Situational anxiety 02/27/2023   GERD (gastroesophageal reflux disease) 11/22/2022   Family history of breast cancer gene mutation in  first degree relative 11/22/2022   Family history of malignant neoplasm of cervix uteri 10/03/2022   Tendinopathy of right rotator cuff 06/20/2022   Asthma 06/07/2021   Food allergy 06/07/2021   Shoulder effusion, right 03/21/2021   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/21/2021   Cervical radiculopathy 03/16/2021   Flushing 07/06/2020   Insomnia 07/06/2020   Lipoma of left lower extremity  01/04/2020   History of cervical cancer 07/12/2019   Polyp of cervix 07/12/2019   Tear of right scapholunate ligament 03/30/2019   Essential hypertension 02/25/2019   Chronic pain of right knee 09/22/2018   Acute lateral meniscus tear of left knee 06/10/2017   Submucous leiomyoma of uterus 03/21/2016   Uterine leiomyoma 03/21/2016   Allergy to insect stings 12/27/2014    Allergies: Allergies[1] Medications: Current Medications[2]  Observations/Objective: Patient is well-developed, well-nourished in no acute distress.  Resting comfortably at home.  Head is normocephalic, atraumatic.  No labored breathing.  Speech is clear and coherent with logical content.  Patient is alert and oriented at baseline.    Assessment and Plan: 1. Strep throat (Primary) - amoxicillin  (AMOXIL ) 500 MG capsule; Take 1 capsule (500 mg total) by mouth 2 (two) times daily for 10 days.  Dispense: 20 capsule; Refill: 0  - Suspect strep throat - Amoxicillin  prescribed - Tylenol  and Ibuprofen alternating every 4 hours - Salt water gargles - Chloraseptic spray - Liquid and soft food diet - Push fluids - New toothbrush in 3 days - Seek in person evaluation if not improving or if symptoms worsen   Follow Up Instructions: I discussed the assessment and treatment plan with the patient. The patient was provided an opportunity to ask questions and all were answered. The patient agreed with the plan and demonstrated an understanding of the instructions.  A copy of instructions were sent to the patient via MyChart unless otherwise noted below.    The patient was advised to call back or seek an in-person evaluation if the symptoms worsen or if the condition fails to improve as anticipated.    Delon CHRISTELLA Dickinson, PA-C     [1]  Allergies Allergen Reactions   Loratadine Palpitations   Meloxicam  Anaphylaxis   Other Cough and Shortness Of Breath    almonds, gluten   Yellow Jacket Venom Anaphylaxis    Lorazepam  Nausea And Vomiting   Nsaids Itching and Swelling    Tingling around lips Swelling top lip   Prednisone  Rash    Able to tolerate inhaled steriods   Wasp Venom Hives and Itching    Stomach and discomfort   [2]  Current Outpatient Medications:    amoxicillin  (AMOXIL ) 500 MG capsule, Take 1 capsule (500 mg total) by mouth 2 (two) times daily for 10 days., Disp: 20 capsule, Rfl: 0   albuterol  (PROVENTIL ) (2.5 MG/3ML) 0.083% nebulizer solution, Take 3 mLs (2.5 mg total) by nebulization every 6 (six) hours as needed for wheezing or shortness of breath., Disp: 75 mL, Rfl: 12   albuterol  (VENTOLIN  HFA) 108 (90 Base) MCG/ACT inhaler, INHALE ONE TO TWO PUFFS BY MOUTH EVERY 6 HOURS AS NEEDED FOR SHORTNESS OF BREATH OR FOR WHEEZING, Disp: 8.5 g, Rfl: 0   Ascorbic Acid (VITAMIN C) 100 MG CHEW, Chew 1 tablet by mouth daily., Disp: , Rfl:    B Complex Vitamins (VITAMIN B COMPLEX) TABS, Take 1 tablet by mouth daily., Disp: , Rfl:    BIOTIN PO, Take 1,250 mcg by mouth daily., Disp: , Rfl:  Estradiol  (IMVEXXY  MAINTENANCE PACK) 10 MCG INST, Place 10 mcg vaginally as directed., Disp: , Rfl:    hydrOXYzine  (ATARAX ) 25 MG tablet, TAKE ONE TABLET BY MOUTH THREE TIMES A DAY AS NEEDED, Disp: 21 tablet, Rfl: 0   Spacer/Aero-Holding Chambers DEVI, 1 Units by Does not apply route daily at 12 noon., Disp: 1 Units, Rfl: 0  "

## 2024-08-20 ENCOUNTER — Ambulatory Visit
Admission: RE | Admit: 2024-08-20 | Discharge: 2024-08-20 | Disposition: A | Discharge status: Home / Self Care | Source: Ambulatory Visit

## 2024-08-20 VITALS — BP 141/85 | HR 93 | Temp 98.3°F | Resp 17

## 2024-08-20 DIAGNOSIS — J028 Acute pharyngitis due to other specified organisms: Secondary | ICD-10-CM | POA: Diagnosis not present

## 2024-08-20 DIAGNOSIS — B9689 Other specified bacterial agents as the cause of diseases classified elsewhere: Secondary | ICD-10-CM | POA: Diagnosis not present

## 2024-08-20 MED ORDER — ONDANSETRON 4 MG PO TBDP: 4.0000 mg | ORAL_TABLET | Freq: Four times a day (QID) | ORAL | 0 refills | Status: AC | PRN

## 2024-08-20 MED ORDER — FLUCONAZOLE 150 MG PO TABS: 150.0000 mg | ORAL_TABLET | ORAL | 0 refills | Status: AC

## 2024-08-20 MED ORDER — AMOXICILLIN-POT CLAVULANATE 600-42.9 MG/5ML PO SUSR: 500.0000 mg | Freq: Two times a day (BID) | ORAL | 0 refills | Status: AC

## 2024-08-20 NOTE — Telephone Encounter (Signed)
 Patient's chart indicated she was seen virtually on 08/19/2024.

## 2024-08-20 NOTE — Discharge Instructions (Addendum)
 You are being treated for a likely bacterial throat infection (strep throat). A rapid strep test was not done because you had already taken several doses of an antibiotic without improvement.  Your Medications - Augmentin  (amoxicillin /clavulanate):   Take twice a day for 10 days. Take with food. Finish all doses, even if you start feeling better. - Ondansetron  (ODT):   Take every 6 hours as needed for nausea. Let the tablet dissolve on your tongue. - Fluconazole :   Take only if symptoms of a yeast infection develop, such as itching or abnormal discharge.  Prevent Spreading Infection - You may still spread infection until you have been on antibiotics for 24 hours. - Avoid close contact with others until then. - Wash hands often. - Do not share drinks, utensils, or toothbrushes. - Replace your toothbrush 24-48 hours after starting antibiotics.  Symptom Relief - Drink plenty of fluids and rest. - Eat soft foods if your throat is sore. - You may use acetaminophen  or ibuprofen for pain or fever as directed.  Seek medical attention if - Symptoms do not improve after 2-3 days on antibiotics - Throat pain or swelling gets worse - You are unable to keep medications down - You develop symptoms of a yeast infection  Get Medical Help Right Away If - You have trouble breathing or swallowing - You develop facial swelling, hives, or severe rash - You cannot keep fluids down - You feel significantly worse instead of better

## 2024-08-20 NOTE — ED Triage Notes (Signed)
 Pt c/o cough, congestion, low grade fever, sore throat for 5 days. States she noticed yesterday she had white patches in back of throat so she did e-vist and was prescribed amoxicillin .  Pt still having sore throat and difficult swallowing

## 2024-08-24 ENCOUNTER — Encounter: Payer: Self-pay | Admitting: Internal Medicine

## 2024-08-24 ENCOUNTER — Ambulatory Visit: Admitting: Internal Medicine

## 2024-08-24 VITALS — BP 128/80 | HR 107 | Temp 98.0°F

## 2024-08-24 DIAGNOSIS — J020 Streptococcal pharyngitis: Secondary | ICD-10-CM

## 2024-08-24 DIAGNOSIS — K219 Gastro-esophageal reflux disease without esophagitis: Secondary | ICD-10-CM

## 2024-08-24 NOTE — Progress Notes (Unsigned)
 " [facility] [address] [place] [address] Dept: [phone] Dept Fax: [phone]  Acute Care Office Visit  Subjective:   Wanda Cortez [birth year] 08/24/2024  Chief Complaint  Patient presents with   Heartburn    Burns when eat feels in in center of chest     HPI:  History of Present Illness   Wanda Cortez is a 54 year old female who presents with burning sensations in her chest and throat. She is accompanied by her husband.  She has been experiencing burning sensations in her chest , epigastric region, and throat for the past week. Initially, she felt as though food was getting caught when swallowing, leading to a diagnosis of strep throat at a UC. She was prescribed amoxicillin , which was ineffective, and subsequently prescribed Augmentin , which she is currently taking. Pain is improving after starting Augmentin .   The burning worsens after eating, and she experiences nausea and reflux. She can only consume small amounts of food, such as half an egg, before the pain intensifies, causing her to stop eating.  No current difficulty swallowing, which was initially present with the strep throat. No fever in the past few days. She is taking Tylenol  daily for sore throat pain and was prescribed Zofran  for nausea, although she has not taken it yet.       The following portions of the patient's history were reviewed and updated as appropriate: past medical history, past surgical history, family history, social history, allergies, medications, and problem list.   Patient Active Problem List   Diagnosis Date Noted   Flank pain 11/13/2023   Chronic chest wall pain 06/06/2023   Astigmatism of both eyes with presbyopia 06/03/2023   Dry eye syndrome of both eyes 06/03/2023   Family history of glaucoma 06/03/2023   Situational anxiety 02/27/2023   GERD (gastroesophageal reflux disease) 11/22/2022   Family history of breast  cancer gene mutation in first degree relative 11/22/2022   Family history of malignant neoplasm of cervix uteri 10/03/2022   Tendinopathy of right rotator cuff 06/20/2022   Asthma 06/07/2021   Food allergy 06/07/2021   Shoulder effusion, right 03/21/2021   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/21/2021   Cervical radiculopathy 03/16/2021   Flushing 07/06/2020   Insomnia 07/06/2020   Lipoma of left lower extremity 01/04/2020   History of cervical cancer 07/12/2019   Polyp of cervix 07/12/2019   Tear of right scapholunate ligament 03/30/2019   Essential hypertension 02/25/2019   Chronic pain of right knee 09/22/2018   Acute lateral meniscus tear of left knee 06/10/2017   Submucous leiomyoma of uterus 03/21/2016   Uterine leiomyoma 03/21/2016   Allergy to insect stings 12/27/2014   Past Medical History:  Diagnosis Date   Asthma    Bronchitis    Pleurisy    PONV (postoperative nausea and vomiting)    Recurrent upper respiratory infection (URI)    Urticaria    Past Surgical History:  Procedure Laterality Date   APPENDECTOMY     CERVICAL BIOPSY  W/ LOOP ELECTRODE EXCISION     CHOLECYSTECTOMY N/A 05/04/2023   Procedure: LAPAROSCOPIC CHOLECYSTECTOMY;  Surgeon: Stevie Herlene Righter, MD;  Location: [facility] OR;  Service: General;  Laterality: N/A;   ESOPHAGOGASTRODUODENOSCOPY (EGD) WITH PROPOFOL  N/A 05/03/2023   Procedure: ESOPHAGOGASTRODUODENOSCOPY (EGD) WITH PROPOFOL ;  Surgeon: Rosalie Kitchens, MD;  Location: [facility] ENDOSCOPY;  Service: Gastroenterology;  Laterality: N/A;   KNEE SURGERY Right 2018   Family History  Problem Relation Age of Onset   Asthma Mother    Allergic rhinitis Mother    Breast cancer Mother        48s   Allergic rhinitis Father    Stroke Father    Hypertension Father    ALS Father 77       negative genetic test   Allergic rhinitis Sister    Arthritis Sister    Allergic rhinitis Sister    Stroke Maternal Grandmother    Uterine cancer Maternal Grandmother     Osteoporosis Maternal Grandmother    Heart disease Paternal Grandmother    Stroke Paternal Grandfather    Heart disease Paternal Grandfather    Allergic rhinitis Nephew    Allergic rhinitis Niece    Current Medications[1] Allergies[2]   ROS: A complete ROS was performed with pertinent positives/negatives noted in the HPI. The remainder of the ROS are negative.    Objective:   Today's Vitals   08/24/24 1345  BP: 128/80  Pulse: (!) 107  Temp: 98 F (36.7 C)  SpO2: 98%    GENERAL: Well-appearing, in NAD. Well nourished.  SKIN: Pink, warm and dry. No rash.  HEENT:    HEAD: Normocephalic, non-traumatic.  EYES: Conjunctive pink without exudate.  NOSE: Septum midline w/o deformity. Nares patent, mucosa pink and non-inflamed w/o drainage.  THROAT: Uvula midline. Oropharynx erythematous. Tonsils 1+ with small amount of white exudate bilaterally. Mucus membranes pink and moist.  NECK: Trachea midline. Full ROM w/o pain or tenderness. No lymphadenopathy.  RESPIRATORY: Chest wall symmetrical. Respirations even and non-labored. Breath sounds clear to auscultation bilaterally.  GI: Abdomen soft, mild tenderness to epigastric region with palpation. Normoactive bowel sounds. No rebound tenderness. No hepatomegaly or splenomegaly.  CARDIAC: S1, S2 present, regular rate and rhythm. Peripheral pulses 2+ bilaterally.  EXTREMITIES: Without clubbing, cyanosis, or edema.  NEUROLOGIC: Steady, even gait.  PSYCH/MENTAL STATUS: Alert, oriented x 3. Cooperative, appropriate mood and affect.    No results found for any visits on 08/24/24.    Assessment & Plan:  Assessment and Plan    Streptococcal pharyngitis and gastroesophageal reflux disease Streptococcal pharyngitis with improved swallowing and pain on Augmentin . Persistent reflux symptoms possibly exacerbated by antibiotics.  - Continue Augmentin  for 10 days. - Use Zofran  for nausea, one disintegratable tablet every 8 hours as needed. - Try  Pepcid  Complete, up to two times a day as needed, for reflux. - Avoid spicy, greasy, and acidic foods. - Perform warm salt water gargles. - Change toothbrush to prevent reinfection. - Continue Tylenol  for pain. - Consider gastroenterology referral if swallowing difficulties return.      No orders of the defined types were placed in this encounter.  No orders of the defined types were placed in this encounter.  Lab Orders  No laboratory test(s) ordered today   No images are attached to the encounter or orders placed in the encounter.  Return if symptoms worsen or fail to improve.   Rosina Senters, FNP     [1]  Current Outpatient Medications:    albuterol  (PROVENTIL ) (2.5 MG/3ML) 0.083% nebulizer solution, Take 3 mLs (2.5 mg total) by nebulization every 6 (six) hours as needed for wheezing or shortness of breath., Disp: 75 mL, Rfl: 12   albuterol  (VENTOLIN  HFA) 108 (90 Base) MCG/ACT inhaler, INHALE ONE TO TWO PUFFS BY MOUTH EVERY 6 HOURS AS NEEDED FOR SHORTNESS OF BREATH OR FOR WHEEZING, Disp: 8.5 g, Rfl: 0   Ascorbic Acid (VITAMIN C) 100 MG CHEW, Chew  1 tablet by mouth daily., Disp: , Rfl:    B Complex Vitamins (VITAMIN B COMPLEX) TABS, Take 1 tablet by mouth daily., Disp: , Rfl:    BIOTIN PO, Take 1,250 mcg by mouth daily., Disp: , Rfl:    Estradiol  (IMVEXXY  MAINTENANCE PACK) 10 MCG INST, Place 10 mcg vaginally as directed., Disp: , Rfl:    fluconazole  (DIFLUCAN ) 150 MG tablet, Take 1 tablet (150 mg total) by mouth every 3 (three) days., Disp: 2 tablet, Rfl: 0   hydrOXYzine  (ATARAX ) 25 MG tablet, TAKE ONE TABLET BY MOUTH THREE TIMES A DAY AS NEEDED, Disp: 21 tablet, Rfl: 0   ondansetron  (ZOFRAN -ODT) 4 MG disintegrating tablet, Take 1 tablet (4 mg total) by mouth every 6 (six) hours as needed for nausea or vomiting., Disp: 20 tablet, Rfl: 0   amoxicillin -clavulanate (AUGMENTIN ) 600-42.9 MG/5ML suspension, Take 4.2 mLs (500 mg total) by mouth 2 (two) times daily for 10 days. (Patient  not taking: Reported on 08/24/2024), Disp: 84 mL, Rfl: 0   Spacer/Aero-Holding Chambers DEVI, 1 Units by Does not apply route daily at 12 noon. (Patient not taking: Reported on 08/24/2024), Disp: 1 Units, Rfl: 0 [2]  Allergies Allergen Reactions   Loratadine Palpitations   Meloxicam  Anaphylaxis   Other Cough and Shortness Of Breath    almonds, gluten   Yellow Jacket Venom Anaphylaxis   Lorazepam  Nausea And Vomiting   Nsaids Itching and Swelling    Tingling around lips Swelling top lip   Prednisone  Rash    Able to tolerate inhaled steriods   Wasp Venom Hives and Itching    Stomach and discomfort    "
